# Patient Record
Sex: Female | Born: 1965 | Race: Black or African American | Hispanic: No | Smoking: Never smoker
Health system: Southern US, Community
[De-identification: ages and names within clinical notes are randomized; demographics above are authoritative.]

## PROBLEM LIST (undated history)

## (undated) DIAGNOSIS — T4145XA Adverse effect of unspecified anesthetic, initial encounter: Secondary | ICD-10-CM

## (undated) DIAGNOSIS — M1990 Unspecified osteoarthritis, unspecified site: Secondary | ICD-10-CM

## (undated) DIAGNOSIS — R112 Nausea with vomiting, unspecified: Secondary | ICD-10-CM

## (undated) DIAGNOSIS — T8859XA Other complications of anesthesia, initial encounter: Secondary | ICD-10-CM

## (undated) DIAGNOSIS — T7840XA Allergy, unspecified, initial encounter: Secondary | ICD-10-CM

## (undated) DIAGNOSIS — Z9889 Other specified postprocedural states: Secondary | ICD-10-CM

## (undated) DIAGNOSIS — Z789 Other specified health status: Secondary | ICD-10-CM

## (undated) HISTORY — DX: Allergy, unspecified, initial encounter: T78.40XA

## (undated) HISTORY — PX: LUNG CANCER SURGERY: SHX702

## (undated) HISTORY — DX: Unspecified osteoarthritis, unspecified site: M19.90

## (undated) HISTORY — PX: ABDOMINAL HYSTERECTOMY: SHX81

## (undated) HISTORY — PX: CHOLECYSTECTOMY: SHX55

## (undated) HISTORY — PX: ENDOMETRIAL ABLATION: SHX621

## (undated) HISTORY — PX: TONSILLECTOMY: SUR1361

## (undated) HISTORY — PX: KNEE ARTHROSCOPY: SUR90

---

## 1997-04-14 ENCOUNTER — Inpatient Hospital Stay (HOSPITAL_COMMUNITY): Admission: AD | Admit: 1997-04-14 | Discharge: 1997-04-16 | Payer: Self-pay | Admitting: Obstetrics & Gynecology

## 1997-06-28 ENCOUNTER — Other Ambulatory Visit: Admission: RE | Admit: 1997-06-28 | Discharge: 1997-06-28 | Payer: Self-pay | Admitting: Obstetrics and Gynecology

## 1997-11-29 ENCOUNTER — Encounter: Payer: Self-pay | Admitting: Obstetrics & Gynecology

## 1997-11-29 ENCOUNTER — Ambulatory Visit (HOSPITAL_COMMUNITY): Admission: RE | Admit: 1997-11-29 | Discharge: 1997-11-29 | Payer: Self-pay | Admitting: Obstetrics & Gynecology

## 1998-12-11 ENCOUNTER — Other Ambulatory Visit: Admission: RE | Admit: 1998-12-11 | Discharge: 1998-12-11 | Payer: Self-pay | Admitting: Obstetrics & Gynecology

## 2000-04-02 ENCOUNTER — Other Ambulatory Visit: Admission: RE | Admit: 2000-04-02 | Discharge: 2000-04-02 | Payer: Self-pay | Admitting: Obstetrics & Gynecology

## 2000-05-13 ENCOUNTER — Other Ambulatory Visit: Admission: RE | Admit: 2000-05-13 | Discharge: 2000-05-13 | Payer: Self-pay | Admitting: Obstetrics & Gynecology

## 2000-05-13 ENCOUNTER — Encounter (INDEPENDENT_AMBULATORY_CARE_PROVIDER_SITE_OTHER): Payer: Self-pay

## 2001-06-15 ENCOUNTER — Other Ambulatory Visit: Admission: RE | Admit: 2001-06-15 | Discharge: 2001-06-15 | Payer: Self-pay | Admitting: Obstetrics & Gynecology

## 2002-08-25 ENCOUNTER — Other Ambulatory Visit: Admission: RE | Admit: 2002-08-25 | Discharge: 2002-08-25 | Payer: Self-pay | Admitting: Obstetrics & Gynecology

## 2002-10-17 ENCOUNTER — Ambulatory Visit (HOSPITAL_COMMUNITY): Admission: RE | Admit: 2002-10-17 | Discharge: 2002-10-17 | Payer: Self-pay | Admitting: Internal Medicine

## 2002-10-17 ENCOUNTER — Encounter: Payer: Self-pay | Admitting: Internal Medicine

## 2002-11-21 ENCOUNTER — Encounter (HOSPITAL_BASED_OUTPATIENT_CLINIC_OR_DEPARTMENT_OTHER): Payer: Self-pay | Admitting: General Surgery

## 2002-11-22 ENCOUNTER — Encounter (HOSPITAL_BASED_OUTPATIENT_CLINIC_OR_DEPARTMENT_OTHER): Payer: Self-pay | Admitting: General Surgery

## 2002-11-22 ENCOUNTER — Ambulatory Visit (HOSPITAL_COMMUNITY): Admission: RE | Admit: 2002-11-22 | Discharge: 2002-11-23 | Payer: Self-pay | Admitting: General Surgery

## 2002-11-22 ENCOUNTER — Encounter (INDEPENDENT_AMBULATORY_CARE_PROVIDER_SITE_OTHER): Payer: Self-pay | Admitting: *Deleted

## 2003-09-14 ENCOUNTER — Other Ambulatory Visit: Admission: RE | Admit: 2003-09-14 | Discharge: 2003-09-14 | Payer: Self-pay | Admitting: Obstetrics & Gynecology

## 2003-11-25 ENCOUNTER — Emergency Department (HOSPITAL_COMMUNITY): Admission: EM | Admit: 2003-11-25 | Discharge: 2003-11-25 | Payer: Self-pay | Admitting: Family Medicine

## 2004-02-16 ENCOUNTER — Encounter (INDEPENDENT_AMBULATORY_CARE_PROVIDER_SITE_OTHER): Payer: Self-pay | Admitting: *Deleted

## 2004-02-16 ENCOUNTER — Ambulatory Visit (HOSPITAL_COMMUNITY): Admission: RE | Admit: 2004-02-16 | Discharge: 2004-02-16 | Payer: Self-pay | Admitting: Obstetrics & Gynecology

## 2004-04-24 ENCOUNTER — Ambulatory Visit: Payer: Self-pay | Admitting: Cardiology

## 2004-04-24 ENCOUNTER — Encounter: Payer: Self-pay | Admitting: Cardiology

## 2004-04-24 ENCOUNTER — Ambulatory Visit (HOSPITAL_COMMUNITY): Admission: RE | Admit: 2004-04-24 | Discharge: 2004-04-24 | Payer: Self-pay | Admitting: Internal Medicine

## 2004-06-26 ENCOUNTER — Emergency Department (HOSPITAL_COMMUNITY): Admission: EM | Admit: 2004-06-26 | Discharge: 2004-06-26 | Payer: Self-pay | Admitting: Emergency Medicine

## 2004-08-21 ENCOUNTER — Ambulatory Visit (HOSPITAL_COMMUNITY): Admission: RE | Admit: 2004-08-21 | Discharge: 2004-08-21 | Payer: Self-pay | Admitting: General Surgery

## 2004-08-21 ENCOUNTER — Encounter (INDEPENDENT_AMBULATORY_CARE_PROVIDER_SITE_OTHER): Payer: Self-pay | Admitting: *Deleted

## 2005-04-23 ENCOUNTER — Emergency Department (HOSPITAL_COMMUNITY): Admission: EM | Admit: 2005-04-23 | Discharge: 2005-04-23 | Payer: Self-pay | Admitting: Family Medicine

## 2006-05-13 ENCOUNTER — Emergency Department (HOSPITAL_COMMUNITY): Admission: EM | Admit: 2006-05-13 | Discharge: 2006-05-13 | Payer: Self-pay | Admitting: Family Medicine

## 2006-06-24 ENCOUNTER — Emergency Department (HOSPITAL_COMMUNITY): Admission: EM | Admit: 2006-06-24 | Discharge: 2006-06-24 | Payer: Self-pay | Admitting: Emergency Medicine

## 2007-04-27 ENCOUNTER — Emergency Department (HOSPITAL_COMMUNITY): Admission: EM | Admit: 2007-04-27 | Discharge: 2007-04-27 | Payer: Self-pay | Admitting: Family Medicine

## 2007-12-27 ENCOUNTER — Encounter (INDEPENDENT_AMBULATORY_CARE_PROVIDER_SITE_OTHER): Payer: Self-pay | Admitting: Obstetrics and Gynecology

## 2007-12-27 ENCOUNTER — Ambulatory Visit (HOSPITAL_COMMUNITY): Admission: RE | Admit: 2007-12-27 | Discharge: 2007-12-27 | Payer: Self-pay | Admitting: Obstetrics and Gynecology

## 2008-04-06 ENCOUNTER — Emergency Department (HOSPITAL_COMMUNITY): Admission: EM | Admit: 2008-04-06 | Discharge: 2008-04-06 | Payer: Self-pay | Admitting: Emergency Medicine

## 2008-09-09 ENCOUNTER — Emergency Department (HOSPITAL_COMMUNITY): Admission: EM | Admit: 2008-09-09 | Discharge: 2008-09-09 | Payer: Self-pay | Admitting: Emergency Medicine

## 2009-07-24 ENCOUNTER — Emergency Department (HOSPITAL_COMMUNITY): Admission: EM | Admit: 2009-07-24 | Discharge: 2009-07-24 | Payer: Self-pay | Admitting: Family Medicine

## 2009-08-14 ENCOUNTER — Emergency Department (HOSPITAL_COMMUNITY): Admission: EM | Admit: 2009-08-14 | Discharge: 2009-08-14 | Payer: Self-pay | Admitting: Family Medicine

## 2010-02-06 ENCOUNTER — Encounter
Admission: RE | Admit: 2010-02-06 | Discharge: 2010-02-06 | Payer: Self-pay | Source: Home / Self Care | Attending: Orthopedic Surgery | Admitting: Orthopedic Surgery

## 2010-02-14 ENCOUNTER — Ambulatory Visit (HOSPITAL_COMMUNITY)
Admission: RE | Admit: 2010-02-14 | Discharge: 2010-02-14 | Payer: Self-pay | Source: Home / Self Care | Attending: Orthopedic Surgery | Admitting: Orthopedic Surgery

## 2010-03-21 ENCOUNTER — Encounter
Admission: RE | Admit: 2010-03-21 | Discharge: 2010-03-26 | Payer: Self-pay | Source: Home / Self Care | Attending: Orthopedic Surgery | Admitting: Orthopedic Surgery

## 2010-03-27 ENCOUNTER — Ambulatory Visit: Payer: BC Managed Care – PPO | Admitting: Physical Therapy

## 2010-03-28 ENCOUNTER — Ambulatory Visit: Payer: BC Managed Care – PPO

## 2010-03-28 ENCOUNTER — Ambulatory Visit: Payer: BC Managed Care – PPO | Attending: Orthopedic Surgery

## 2010-03-28 DIAGNOSIS — M25669 Stiffness of unspecified knee, not elsewhere classified: Secondary | ICD-10-CM | POA: Insufficient documentation

## 2010-03-28 DIAGNOSIS — IMO0001 Reserved for inherently not codable concepts without codable children: Secondary | ICD-10-CM | POA: Insufficient documentation

## 2010-04-02 ENCOUNTER — Ambulatory Visit: Payer: BC Managed Care – PPO

## 2010-04-04 ENCOUNTER — Ambulatory Visit: Payer: BC Managed Care – PPO

## 2010-04-08 ENCOUNTER — Ambulatory Visit: Payer: BC Managed Care – PPO | Admitting: Physical Therapy

## 2010-04-15 ENCOUNTER — Other Ambulatory Visit: Payer: Self-pay | Admitting: Obstetrics & Gynecology

## 2010-05-06 LAB — COMPREHENSIVE METABOLIC PANEL
Alkaline Phosphatase: 60 U/L (ref 39–117)
BUN: 14 mg/dL (ref 6–23)
Chloride: 105 mEq/L (ref 96–112)
Creatinine, Ser: 0.78 mg/dL (ref 0.4–1.2)
GFR calc non Af Amer: >60 mL/min (ref >60)
Glucose, Bld: 101 mg/dL — ABNORMAL HIGH (ref 70–99)
Potassium: 4 mEq/L (ref 3.5–5.1)
Total Bilirubin: 0.5 mg/dL (ref 0.3–1.2)

## 2010-05-06 LAB — URINALYSIS, ROUTINE W REFLEX MICROSCOPIC
Hgb urine dipstick: NEGATIVE
Nitrite: NEGATIVE
Protein, ur: NEGATIVE mg/dL
Specific Gravity, Urine: 1.028 (ref 1.005–1.030)
Urobilinogen, UA: 1 mg/dL (ref 0.0–1.0)

## 2010-05-06 LAB — CBC
HCT: 35.1 % — ABNORMAL LOW (ref 36.0–46.0)
MCH: 26.9 pg (ref 26.0–34.0)
MCV: 82.8 fL (ref 78.0–100.0)
Platelets: 244 10*3/uL (ref 150–400)
RBC: 4.24 MIL/uL (ref 3.87–5.11)
WBC: 6.2 10*3/uL (ref 4.0–10.5)

## 2010-05-06 LAB — SURGICAL PCR SCREEN
MRSA, PCR: NEGATIVE
Staphylococcus aureus: NEGATIVE

## 2010-05-12 LAB — POCT URINALYSIS DIP (DEVICE)
Ketones, ur: NEGATIVE mg/dL
Protein, ur: NEGATIVE mg/dL
Urobilinogen, UA: 1 mg/dL (ref 0.0–1.0)
pH: 6 (ref 5.0–8.0)

## 2010-05-13 LAB — POCT RAPID STREP A (OFFICE): Streptococcus, Group A Screen (Direct): NEGATIVE

## 2010-07-09 NOTE — Op Note (Signed)
NAME:  [name], [name] NO.:  [id number]   MEDICAL RECORD NO.:  [id number]          PATIENT TYPE:  AMB   LOCATION:  SDC                           FACILITY:  [facility]   PHYSICIAN:  Malva Limes, M.D.    DATE OF BIRTH:  1965-05-26   DATE OF PROCEDURE:  12/27/2007  DATE OF DISCHARGE:                               OPERATIVE REPORT   PREOPERATIVE DIAGNOSIS:  Menorrhagia.   POSTOPERATIVE DIAGNOSIS:  Menorrhagia.   PROCEDURE:  1. Dilation and curettage.  2. Endometrial ablation with NovaSure device.   SURGEON:  Malva Limes, MD   ANESTHESIA:  MAC with paracervical block.   DRAINS:  None.   ANTIBIOTICS:  Ancef 1 g.   SPECIMENS:  Endometrial curettings sent to pathology.   COMPLICATIONS:  None.   ESTIMATED BLOOD LOSS:  Minimal.   PROCEDURE:  The patient was taken to the operating room, where she was  placed in a dorsal supine position.  MAC anesthesia was then  administered.  She was then placed in dorsal lithotomy position.  She  was prepped with Hibiclens and draped in the usual fashion for this  procedure.  An exam under anesthesia revealed an anteverted uterus of  normal size and shape.  A sterile speculum was placed in the vagina.  A  13 mL of 1% lidocaine was used for paracervical block.  The cervix was  then serially dilated to 29-French.  The uterus was sounded to 8.5 cm.  The cervical length was then measured at 3 cm giving a cavitary length  of 5.5 cm.  A sharp curettage was then performed.  Tissue was sent to  pathology.  Device was then placed in the uterine cavity and opened.  The width of the uterine cavity was 3.5 cm.  At this point, the device  was turned on for a 1 minute and 3 seconds, giving 109 watts.  The  device was then removed.  The patient tolerated the procedure well.  She  was taken to recovery room in stable condition.  Instrument and lap  counts were correct x1.  The patient will be discharged to home.  She  will be sent home with  Percocet to take p.r.n.  She will follow up in  the office in 4 weeks.          ______________________________  Malva Limes, M.D.    MA/MEDQ  D:  12/27/2007  T:  12/28/2007  Job:  [id number]

## 2010-07-12 NOTE — Op Note (Signed)
NAME:  TEMIKA, SUTPHIN NO.:  [id number]   MEDICAL RECORD NO.:  [id number]          PATIENT TYPE:  AMB   LOCATION:  DAY                          FACILITY:  [facility]   PHYSICIAN:  Leonie Man, M.D.   DATE OF BIRTH:  1965-10-19   DATE OF PROCEDURE:  08/21/2004  DATE OF DISCHARGE:                                 OPERATIVE REPORT   PREOPERATIVE DIAGNOSIS:  Grade 3 hemorrhoidal disease with anal tags.   POSTOPERATIVE DIAGNOSIS:  Grade 3 hemorrhoidal disease with anal tags.   PROCEDURE:  PPH stapled hemorrhoidectomy.   SURGEON:  Leonie Man, M.D.   ASSISTANT:  OR tech.   ANESTHESIA:  General.   SPECIMENS TO THE PATHOLOGIST:  Hemorrhoids.   ESTIMATED BLOOD LOSS:  Minimal.   COMPLICATIONS:  There no apparent complication. The patient returned to the  PACU in excellent condition.   Note, the patient is a 45 year old woman with recurrent hemorrhoidal  prolapse and external hemorrhoids who wishes to proceed with  hemorrhoidectomy. She has been having no constipation but mild recurrent  episodes of bleeding that had not been relieved by conservative methods. She  comes to the operating room today after the risks and potential benefits of  surgery had been discussed, all questions answered, consent obtained.   DESCRIPTION OF PROCEDURE:  Following the induction of satisfactory general  anesthesia, the patient is turned to the prone jackknife position and the  buttock cheeks are spread apart and held with tapes. The perianal tissues,  anus and vagina are prepped and draped to be included in the sterile  operative field. I injected 0.25% Marcaine with epinephrine around the  perianal tissues then dilated the anus up to approximately three  fingerbreadth's. I then inserted the operating scope and approximately 4 cm  above the dentate line, I placed 2-0 Prolene pursestring around the entire  anal verge. At this point, I tested the pursestring to check for its  integrity. I also checked the vagina to make sure that the rectovaginal  septum was not included within the pursestring and it was noted to be clear.  I then inserted the PPH stapling device and tied the pursestring suture down  around the anvil of the PPH device. I then close PPH device pulling the  hemorrhoidal tissue up into the device. An interval of one minute was  allowed so as to assure hemostasis and removal of all edema from the staple  line. The staple line was then fired and the stapler removed. A few  additional bleeding points along the staple line were treated with  electrocautery. The removed  hemorrhoid were inspected and there was a full and complete uninterrupted  ring. Sponge and instrument counts were then verified and a sterile dressing  placed over the anus. The anesthetic reversed and the patient removed from  the operating room to the recovery room in stable condition.       PB/MEDQ  D:  08/21/2004  T:  08/21/2004  Job:  [id number]

## 2010-07-12 NOTE — Op Note (Signed)
NAME:  [name], [name]                        ACCOUNT NO.:  [id number]   MEDICAL RECORD NO.:  [id number]                   PATIENT TYPE:  OIB   LOCATION:  2899                                 FACILITY:  [facility]   PHYSICIAN:  Leonie Man, M.D.                DATE OF BIRTH:  06-Oct-1965   DATE OF PROCEDURE:  11/22/2002  DATE OF DISCHARGE:                                 OPERATIVE REPORT   PREOPERATIVE DIAGNOSIS:  Chronic calculous cholecystitis.   POSTOPERATIVE DIAGNOSIS:  Chronic calculous cholecystitis.   OPERATION PERFORMED:  Laparoscopic cholecystectomy with intraoperative  cholangiogram.   SURGEON:  Leonie Man, M.D.   ASSISTANT:  Joanne Gavel, M.D.   ANESTHESIA:  General.   INDICATIONS FOR PROCEDURE:  The patient is a 45 year old woman who presents  with upper abdominal symptoms of pain, nausea and vomiting usually following  meals.  This has been going on since the early part of August this year.  She has had no chills, fever or jaundice.  Upper GI series is normal.  Abdominal ultrasound shows cholelithiasis.  She comes to the operating room  now after the risks and potential benefits of surgery have been fully  discussed, all questions answered and consent obtained.   DESCRIPTION OF PROCEDURE:  Following induction of satisfactory general  anesthesia, the patient positioned supinely, the abdomen was routinely  prepped and draped to be included in a sterile operative field.  Open  laparoscopy created at the umbilicus with insertion of a Hasson cannula and  insufflation of the peritoneal cavity to pressure.  Camera was  inserted and visual exploration of the abdomen carried out.  The anterior  gastric wall and duodenal sweep appeared to be normal.  The gallbladder was  chronically scarred and somewhat hydropic.  The liver edges were sharp,  liver surfaces smooth.  None of the small or large intestine viewed appeared  to be abnormal.  Under direct vision,  epigastric and lateral, ports were  placed, the gallbladder was grasped and retracted cephalad and dissection  carried down near the ampulla of the gallbladder with isolation of the  cystic artery and cystic duct.  The cystic artery traced to its entry into  the gallbladder wall.  It was doubly clipped and transected.  The cystic  duct was placed up to the gallbladder cystic duct junction and down to the  common duct junction.  This was clipped proximally and opened. A cystic duct  cholangiogram was then carried out with insertion of a Reddick catheter into  the peritoneal cavity and insertion into the cystic duct.  One half strength  Hypaque dye was used to inject into the intrahepatic biliary system under  fluoroscopic guidance.  The resulting cholangiogram showed prompt flow of  the contrast into the duodenum.  No filling defects into the extrahepatic or  intrahepatic ducts.  The duct size was noted to be normal.  Cholangiocatheter was removed.  The cystic duct was doubly clipped and  transected and the gallbladder dissected free from the liver bed using  electrocautery and maintaining hemostasis through the course of the  dissection.  At the end of dissection, the liver bed was checked for  hemostasis and noted to be dry.  The gallbladder was then grasped and  retrieved through the umbilical wound with a camera placed in the epigastric  wound without difficulty.  The right upper quadrant was thoroughly irrigated  and aspirated free.  Trocars were removed under direct vision.  The  pneumoperitoneum was allowed to deflate.  Sponge, instrument and sharp  counts were then verified.  The wound was closed in layers with the  umbilical wound in two layers with 0 Vicryl and 4-0  Monocryl.  Epigastric and flank wound was closed with 4-0 Monocryl.  All  wounds were reinforced with Steri-Strips.  Sterile dressings applied.  Anesthetic was reversed and the patient removed from the operating room  to  the recovery room in stable condition.  She tolerated the procedure well.                                                Leonie Man, M.D.    PB/MEDQ  D:  11/22/2002  T:  11/22/2002  Job:  [id number]

## 2010-07-12 NOTE — Op Note (Signed)
NAME:  [name], [name] NO.:  [id number]   MEDICAL RECORD NO.:  [id number]          PATIENT TYPE:  AMB   LOCATION:  SDC                           FACILITY:  [facility]   PHYSICIAN:  Gerrit Friends. Aldona Bar, M.D.   DATE OF BIRTH:  1965-10-10   DATE OF PROCEDURE:  02/16/2004  DATE OF DISCHARGE:                                 OPERATIVE REPORT   PREOPERATIVE DIAGNOSES:  Menorrhagia and history of anemia.   POSTOPERATIVE DIAGNOSES:  Menorrhagia and history of anemia, pathology  pending.   PROCEDURES:  1.  Examination under anesthesia.  2.  Dilatation and curettage.   ANESTHESIA:  IV sedation and paracervical block with 1% Xylocaine without  epinephrine.   SURGEON:  Gerrit Friends. Aldona Bar, M.D.   PROCEDURE:  This patient, a 45 year old female, gravida 4, para 2, abortus  2, had her last menses in early December and with each period relates five  days of heavy bleeding with clots.  Her cycles are relatively regular.  Her  annual exam in July was essentially normal, Pap smear and mammogram both  normal.  She has been anemic in the past.  In July her hemoglobin was 11.9.  Her thyroid function tests in July were also normal.  She is taken to the  operating room now for examination under anesthesia and dilatation and  curettage with a diagnosis of menorrhagia.   PROCEDURE:  The patient was taken to the operating room, where after the  satisfactory induction of intravenous sedation she was prepped and draped  having been placed in the short Allen stirrups in the modified lithotomy  position.  She was prepped and draped in the usual fashion.  Bladder was  drained of clear urine with a red rubber catheter in an in-and-out fashion.  The speculum was placed, single-tooth tenaculum placed on the anterior lip  of the cervix, and a paracervical block carried out with approximately 18 mL  of 1% Xylocaine with epinephrine.   At this time after the anesthetic was effective, the internal os was  dilated  to a #27 Pratt dilator.  The cavity sounded to 9 cm.  Using the small ring  forceps, the cavity was probed and no polypoid-like tissue was produced.  Thereafter using a small serrated curette, a thorough gentle and systematic  curettage was carried out with production of a moderate amount of tissue,  all of which was sent to pathology appropriately labeled, endometrial  curettings.  Again the cavity was probed, no production of further tissue,  and again the cavity was curetted with a minimum amount of tissue produced.  The procedure at this time was felt to be complete.  All instruments were  removed.  Blood loss noted to be approximately 25 mL.  As mentioned, total  specimen was sent labeled, endometrial curettings.  Examination was now  carried out with findings consistent with a uterus that was normal size,  very mobile.  Adnexal areas were negative.  The uterus did not seem to pull  down that well but did pull down some, probably to within 1.5 cm of the  introitus.   The patient was taken to the recovery room in satisfactory condition, having  tolerated the procedure well.  She will be discharged to home and may use  Advil or Aleve as needed for cramping and will return to the office in  approximately three to four weeks.  Condition on arrival to recovery room  satisfactory.      RMW/MEDQ  D:  02/16/2004  T:  02/16/2004  Job:  [id number]

## 2010-11-26 LAB — CBC
HCT: 38.4
Hemoglobin: 12.3
WBC: 5.6

## 2011-02-05 ENCOUNTER — Encounter (HOSPITAL_COMMUNITY): Payer: Self-pay

## 2011-02-06 ENCOUNTER — Encounter (HOSPITAL_COMMUNITY): Payer: Self-pay

## 2011-02-06 ENCOUNTER — Encounter (HOSPITAL_COMMUNITY)
Admission: RE | Admit: 2011-02-06 | Discharge: 2011-02-06 | Disposition: A | Discharge status: Home / Self Care | Payer: BC Managed Care – PPO | Source: Ambulatory Visit | Attending: Obstetrics and Gynecology | Admitting: Obstetrics and Gynecology

## 2011-02-06 HISTORY — DX: Adverse effect of unspecified anesthetic, initial encounter: T41.45XA

## 2011-02-06 HISTORY — DX: Other specified postprocedural states: R11.2

## 2011-02-06 HISTORY — DX: Other specified health status: Z78.9

## 2011-02-06 HISTORY — DX: Other complications of anesthesia, initial encounter: T88.59XA

## 2011-02-06 HISTORY — DX: Other specified postprocedural states: Z98.890

## 2011-02-06 LAB — CBC
HCT: 40.1 % (ref 36.0–46.0)
MCV: 82.5 fL (ref 78.0–100.0)
Platelets: 288 10*3/uL (ref 150–400)
RBC: 4.86 MIL/uL (ref 3.87–5.11)
WBC: 7.3 10*3/uL (ref 4.0–10.5)

## 2011-02-06 NOTE — Patient Instructions (Signed)
YOUR PROCEDURE IS SCHEDULED ON:02/13/11  ENTER THROUGH THE MAIN ENTRANCE OF [facility] AT:1030 am  USE DESK PHONE AND DIAL [phone] TO INFORM us OF YOUR ARRIVAL  CALL [phone] IF YOU HAVE ANY QUESTIONS OR PROBLEMS PRIOR TO YOUR ARRIVAL.  REMEMBER: DO NOT EAT AFTER MIDNIGHT :Wed  SPECIAL INSTRUCTIONS:water ok until 8am Thursday   YOU MAY BRUSH YOUR TEETH THE MORNING OF SURGERY   TAKE THESE MEDICINES THE DAY OF SURGERY WITH SIP OF WATER:none   DO NOT WEAR JEWELRY, EYE MAKEUP, LIPSTICK OR DARK FINGERNAIL POLISH DO NOT WEAR LOTIONS  DO NOT SHAVE FOR 48 HOURS PRIOR TO SURGERY  YOU WILL NOT BE ALLOWED TO DRIVE YOURSELF HOME.  NAME OF DRIVER: NEPHEW- Sharia Reeve [id number]

## 2011-02-12 NOTE — H&P (Signed)
NAME:  [name], [name] NO.:  [id number]  MEDICAL RECORD NO.:  [id number]  LOCATION:  PERIO                         FACILITY:  [facility]  PHYSICIAN:  Malva Limes, M.D.    DATE OF BIRTH:  Apr 29, 1965  DATE OF ADMISSION:  02/05/2011 DATE OF DISCHARGE:                             HISTORY & PHYSICAL   HISTORY OF PRESENT ILLNESS:  [name] is a 45 year old, black female, G4, P2-0-2-2, who presents to [facility] for a laparoscopic- assisted vaginal hysterectomy with bilateral salpingo-oophorectomy, secondary to chronic pelvic pain.  The patient began to have pelvic pain approximately 1 year ago, at which time, she saw Dr. Deniece Portela.  This pain was in the left lower quadrant, intermittent.  He felt it was possibly endometriosis and treated the patient with 6 months of Lupron.  While the patient was on Lupron, she had no pain.  However, after discontinuing the use of Lupron, the pain returned approximately 2 months later.  The patient also has known small uterine fibroids.  The patient did have a NovaSure procedure done in 2009, secondary to menometrorrhagia.  Since that time, she has had no uterine bleeding. Ultrasound also did not reveal hematometra.  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 She underwent cystometric which results indicated the patient most likely had urge incontinence.  The patient was put on Detrol and since that time has had no loss of urine, nocturia or urgency.  Because of that, she will not undergo a TVT sling.  Prior to this procedure performed, the patient expressed her understanding of the possible risks and complications of surgery.  She also notes that she will go into menopause because of loss of her ovaries.  PAST MEDICAL HISTORY:  The patient has had 2 vaginal births, a postpartum tubal ligation, tonsillectomy, surgery on her knee and the NovaSure D and C as mentioned above.  ALLERGIES:   The patient is allergic to penicillin, Augmentin, the tetanus vaccine, shell fish.  CURRENT MEDICATIONS:  Hydrocodone p.r.n.  PHYSICAL EXAMINATION:  GENERAL:  The patient is a well-developed, well- nourished, black female, in no apparent distress.  HEENT:  Within normal limits. LUNGS:  Clear to auscultation. CARDIOVASCULAR:  Regular rate and rhythm without murmur. BREASTS:  Without masses or tenderness.  There is no lymphadenopathy. ABDOMEN:  Soft, nontender and nondistended.  There is no rebound or guarding.  There is no organomegaly. EXTREMITIES:  Within normal limits. PELVIC:  Normal external genitalia.  The vagina without lesions or discharge.  The cervix is parous.  The uterus feels approximately 8 weeks in size.  There is no adnexal masses.  There is some cervical motion tenderness.  IMPRESSION: 1. Chronic pelvic pain. 2. Likely endometriosis.  PLAN:  Proceed with laparoscopic-assisted vaginal hysterectomy with bilateral salpingo-oophorectomy.          ______________________________ Malva Limes, M.D.     MA/MEDQ  D:  02/11/2011  T:  02/12/2011  Job:  [id number]

## 2011-02-13 ENCOUNTER — Ambulatory Visit (HOSPITAL_COMMUNITY): Payer: BC Managed Care – PPO | Admitting: Anesthesiology

## 2011-02-13 ENCOUNTER — Other Ambulatory Visit: Payer: Self-pay | Admitting: Obstetrics and Gynecology

## 2011-02-13 ENCOUNTER — Encounter (HOSPITAL_COMMUNITY): Payer: Self-pay | Admitting: Anesthesiology

## 2011-02-13 ENCOUNTER — Encounter (HOSPITAL_COMMUNITY)
Admission: RE | Disposition: A | Discharge status: Home / Self Care | Payer: Self-pay | Source: Ambulatory Visit | Attending: Obstetrics and Gynecology

## 2011-02-13 ENCOUNTER — Inpatient Hospital Stay (HOSPITAL_COMMUNITY)
Admission: RE | Admit: 2011-02-13 | Discharge: 2011-02-14 | DRG: 361 | Disposition: A | Discharge status: Home / Self Care | Payer: BC Managed Care – PPO | Source: Ambulatory Visit | Attending: Obstetrics and Gynecology | Admitting: Obstetrics and Gynecology

## 2011-02-13 DIAGNOSIS — D251 Intramural leiomyoma of uterus: Secondary | ICD-10-CM | POA: Diagnosis present

## 2011-02-13 DIAGNOSIS — N838 Other noninflammatory disorders of ovary, fallopian tube and broad ligament: Secondary | ICD-10-CM | POA: Diagnosis present

## 2011-02-13 DIAGNOSIS — N949 Unspecified condition associated with female genital organs and menstrual cycle: Principal | ICD-10-CM | POA: Diagnosis present

## 2011-02-13 DIAGNOSIS — Z3480 Encounter for supervision of other normal pregnancy, unspecified trimester: Secondary | ICD-10-CM

## 2011-02-13 DIAGNOSIS — D252 Subserosal leiomyoma of uterus: Secondary | ICD-10-CM | POA: Diagnosis present

## 2011-02-13 DIAGNOSIS — N8000 Endometriosis of the uterus, unspecified: Secondary | ICD-10-CM | POA: Diagnosis present

## 2011-02-13 HISTORY — PX: LAPAROSCOPIC ASSISTED VAGINAL HYSTERECTOMY: SHX5398

## 2011-02-13 HISTORY — PX: SALPINGOOPHORECTOMY: SHX82

## 2011-02-13 SURGERY — HYSTERECTOMY, VAGINAL, LAPAROSCOPY-ASSISTED
Anesthesia: General | Site: Uterus | Wound class: Clean Contaminated

## 2011-02-13 MED ORDER — NEOSTIGMINE METHYLSULFATE 1 MG/ML IJ SOLN
INTRAMUSCULAR | Status: AC
  Filled 2011-02-13: qty 10

## 2011-02-13 MED ORDER — TOLTERODINE TARTRATE ER 4 MG PO CP24
4.0000 mg | ORAL_CAPSULE | Freq: Every day | ORAL | Status: DC
  Administered 2011-02-14: 4 mg via ORAL
  Filled 2011-02-13 (×3): qty 1

## 2011-02-13 MED ORDER — LIDOCAINE-EPINEPHRINE 1 %-1:100000 IJ SOLN
INTRAMUSCULAR | Status: DC | PRN
  Administered 2011-02-13: 10 mL

## 2011-02-13 MED ORDER — FENTANYL CITRATE 0.05 MG/ML IJ SOLN
INTRAMUSCULAR | Status: DC | PRN
  Administered 2011-02-13: 50 ug via INTRAVENOUS
  Administered 2011-02-13: 100 ug via INTRAVENOUS
  Administered 2011-02-13 (×2): 50 ug via INTRAVENOUS
  Administered 2011-02-13: 100 ug via INTRAVENOUS

## 2011-02-13 MED ORDER — ONDANSETRON HCL 4 MG/2ML IJ SOLN
INTRAMUSCULAR | Status: AC
  Filled 2011-02-13: qty 2

## 2011-02-13 MED ORDER — MIDAZOLAM HCL 5 MG/5ML IJ SOLN
INTRAMUSCULAR | Status: DC | PRN
  Administered 2011-02-13: 2 mg via INTRAVENOUS

## 2011-02-13 MED ORDER — CEFAZOLIN SODIUM 1-5 GM-% IV SOLN
1.0000 g | INTRAVENOUS | Status: AC
  Administered 2011-02-13: 1 g via INTRAVENOUS

## 2011-02-13 MED ORDER — LIDOCAINE HCL (CARDIAC) 20 MG/ML IV SOLN
INTRAVENOUS | Status: AC
  Filled 2011-02-13: qty 5

## 2011-02-13 MED ORDER — FENTANYL CITRATE 0.05 MG/ML IJ SOLN
INTRAMUSCULAR | Status: AC
  Filled 2011-02-13: qty 5

## 2011-02-13 MED ORDER — CEFAZOLIN SODIUM 1-5 GM-% IV SOLN
INTRAVENOUS | Status: AC
  Filled 2011-02-13: qty 50

## 2011-02-13 MED ORDER — DEXAMETHASONE SODIUM PHOSPHATE 10 MG/ML IJ SOLN
INTRAMUSCULAR | Status: AC
  Filled 2011-02-13: qty 1

## 2011-02-13 MED ORDER — PHENYLEPHRINE HCL 10 MG/ML IJ SOLN
INTRAMUSCULAR | Status: DC | PRN
  Administered 2011-02-13 (×2): 80 ug via INTRAVENOUS
  Administered 2011-02-13: 40 ug via INTRAVENOUS

## 2011-02-13 MED ORDER — ROCURONIUM BROMIDE 100 MG/10ML IV SOLN
INTRAVENOUS | Status: DC | PRN
  Administered 2011-02-13: 5 mg via INTRAVENOUS
  Administered 2011-02-13: 35 mg via INTRAVENOUS
  Administered 2011-02-13: 10 mg via INTRAVENOUS

## 2011-02-13 MED ORDER — DOCUSATE SODIUM 100 MG PO CAPS
100.0000 mg | ORAL_CAPSULE | Freq: Two times a day (BID) | ORAL | Status: DC
  Administered 2011-02-13 – 2011-02-14 (×2): 100 mg via ORAL
  Filled 2011-02-13 (×2): qty 1

## 2011-02-13 MED ORDER — OXYCODONE-ACETAMINOPHEN 5-325 MG PO TABS
1.0000 | ORAL_TABLET | ORAL | Status: DC | PRN
  Administered 2011-02-13 – 2011-02-14 (×4): 2 via ORAL
  Filled 2011-02-13 (×4): qty 2

## 2011-02-13 MED ORDER — ROCURONIUM BROMIDE 50 MG/5ML IV SOLN
INTRAVENOUS | Status: AC
  Filled 2011-02-13: qty 1

## 2011-02-13 MED ORDER — KETOROLAC TROMETHAMINE 60 MG/2ML IM SOLN
INTRAMUSCULAR | Status: DC | PRN
  Administered 2011-02-13: 30 mg via INTRAMUSCULAR

## 2011-02-13 MED ORDER — KETOROLAC TROMETHAMINE 30 MG/ML IJ SOLN
INTRAMUSCULAR | Status: DC | PRN
  Administered 2011-02-13: 30 mg via INTRAVENOUS

## 2011-02-13 MED ORDER — LACTATED RINGERS IR SOLN
Status: DC | PRN
  Administered 2011-02-13: 3000 mL

## 2011-02-13 MED ORDER — ONDANSETRON HCL 4 MG/2ML IJ SOLN
INTRAMUSCULAR | Status: DC | PRN
  Administered 2011-02-13: 4 mg via INTRAVENOUS

## 2011-02-13 MED ORDER — DEXAMETHASONE SODIUM PHOSPHATE 10 MG/ML IJ SOLN
INTRAMUSCULAR | Status: DC | PRN
  Administered 2011-02-13: 10 mg via INTRAVENOUS

## 2011-02-13 MED ORDER — GLYCOPYRROLATE 0.2 MG/ML IJ SOLN
INTRAMUSCULAR | Status: AC
  Filled 2011-02-13: qty 2

## 2011-02-13 MED ORDER — NEOSTIGMINE METHYLSULFATE 1 MG/ML IJ SOLN
INTRAMUSCULAR | Status: DC | PRN
  Administered 2011-02-13: 3 mg via INTRAVENOUS

## 2011-02-13 MED ORDER — LACTATED RINGERS IV SOLN
INTRAVENOUS | Status: DC
  Administered 2011-02-13 (×4): via INTRAVENOUS

## 2011-02-13 MED ORDER — GLYCOPYRROLATE 0.2 MG/ML IJ SOLN
INTRAMUSCULAR | Status: DC | PRN
  Administered 2011-02-13: 0.2 mg via INTRAVENOUS
  Administered 2011-02-13: .6 mg via INTRAVENOUS

## 2011-02-13 MED ORDER — HYDROMORPHONE HCL PF 1 MG/ML IJ SOLN
INTRAMUSCULAR | Status: AC
  Administered 2011-02-13: 0.6 mg via INTRAVENOUS
  Filled 2011-02-13: qty 1

## 2011-02-13 MED ORDER — BUPIVACAINE HCL (PF) 0.25 % IJ SOLN
INTRAMUSCULAR | Status: DC | PRN
  Administered 2011-02-13: 10 mL

## 2011-02-13 MED ORDER — HYDROMORPHONE HCL PF 1 MG/ML IJ SOLN
0.2000 mg | INTRAMUSCULAR | Status: DC | PRN
  Administered 2011-02-13 (×2): 0.6 mg via INTRAVENOUS
  Filled 2011-02-13 (×2): qty 1

## 2011-02-13 MED ORDER — LIDOCAINE HCL (CARDIAC) 20 MG/ML IV SOLN
INTRAVENOUS | Status: DC | PRN
  Administered 2011-02-13: 60 mg via INTRAVENOUS

## 2011-02-13 MED ORDER — DEXTROSE-NACL 5-0.45 % IV SOLN
INTRAVENOUS | Status: DC
  Administered 2011-02-13 – 2011-02-14 (×2): via INTRAVENOUS

## 2011-02-13 MED ORDER — PROPOFOL 10 MG/ML IV EMUL
INTRAVENOUS | Status: DC | PRN
  Administered 2011-02-13: 150 mg via INTRAVENOUS

## 2011-02-13 MED ORDER — KETOROLAC TROMETHAMINE 30 MG/ML IJ SOLN: 30.0000 mg | Freq: Four times a day (QID) | INTRAMUSCULAR | Status: DC

## 2011-02-13 MED ORDER — MIDAZOLAM HCL 2 MG/2ML IJ SOLN
INTRAMUSCULAR | Status: AC
  Filled 2011-02-13: qty 2

## 2011-02-13 MED ORDER — PROPOFOL 10 MG/ML IV EMUL
INTRAVENOUS | Status: AC
  Filled 2011-02-13: qty 20

## 2011-02-13 MED ORDER — HYDROMORPHONE HCL PF 1 MG/ML IJ SOLN
0.2500 mg | INTRAMUSCULAR | Status: DC | PRN
  Administered 2011-02-13 (×2): 0.5 mg via INTRAVENOUS

## 2011-02-13 SURGICAL SUPPLY — 33 items
ADH SKN CLS APL DERMABOND .7 (GAUZE/BANDAGES/DRESSINGS) ×3
CATH ROBINSON RED A/P 16FR (CATHETERS) ×2 IMPLANT
CLOTH BEACON ORANGE TIMEOUT ST (SAFETY) ×4 IMPLANT
COVER TABLE BACK 60X90 (DRAPES) ×4 IMPLANT
DECANTER SPIKE VIAL GLASS SM (MISCELLANEOUS) ×2 IMPLANT
DERMABOND ADVANCED (GAUZE/BANDAGES/DRESSINGS) ×1
DERMABOND ADVANCED .7 DNX12 (GAUZE/BANDAGES/DRESSINGS) ×3 IMPLANT
ELECT REM PT RETURN 9FT ADLT (ELECTROSURGICAL) ×4
ELECTRODE REM PT RTRN 9FT ADLT (ELECTROSURGICAL) ×1 IMPLANT
EVACUATOR SMOKE 8.L (FILTER) IMPLANT
FORCEPS CUTTING 33CM 5MM (CUTTING FORCEPS) ×2 IMPLANT
GLOVE ECLIPSE 7.0 STRL STRAW (GLOVE) ×8 IMPLANT
GOWN PREVENTION PLUS LG XLONG (DISPOSABLE) ×12 IMPLANT
GOWN PREVENTION PLUS XLARGE (GOWN DISPOSABLE) ×4 IMPLANT
NS IRRIG 1000ML POUR BTL (IV SOLUTION) ×4 IMPLANT
PACK LAVH (CUSTOM PROCEDURE TRAY) ×4 IMPLANT
SCISSORS LAP 5X35 DISP (ENDOMECHANICALS) IMPLANT
SET IRRIG TUBING LAPAROSCOPIC (IRRIGATION / IRRIGATOR) ×2 IMPLANT
SOLUTION ELECTROLUBE (MISCELLANEOUS) ×4 IMPLANT
STRIP CLOSURE SKIN 1/4X3 (GAUZE/BANDAGES/DRESSINGS) IMPLANT
SUT MNCRL 0 MO-4 VIOLET 18 CR (SUTURE) ×9 IMPLANT
SUT MNCRL 0 VIOLET 6X18 (SUTURE) ×3 IMPLANT
SUT MONOCRYL 0 6X18 (SUTURE) ×1
SUT MONOCRYL 0 MO 4 18  CR/8 (SUTURE) ×3
SUT VIC AB 2-0 CT1 27 (SUTURE) ×16
SUT VIC AB 2-0 CT1 TAPERPNT 27 (SUTURE) ×12 IMPLANT
SUT VICRYL 0 UR6 27IN ABS (SUTURE) ×8 IMPLANT
SUT VICRYL RAPIDE 3 0 (SUTURE) ×4 IMPLANT
TOWEL OR 17X24 6PK STRL BLUE (TOWEL DISPOSABLE) ×8 IMPLANT
TROCAR BALLN 12MMX100 BLUNT (TROCAR) ×4 IMPLANT
TROCAR Z-THREAD BLADED 5X100MM (TROCAR) ×8 IMPLANT
WARMER LAPAROSCOPE (MISCELLANEOUS) ×4 IMPLANT
WATER STERILE IRR 1000ML POUR (IV SOLUTION) ×4 IMPLANT

## 2011-02-13 NOTE — Progress Notes (Signed)
Pt has been seen and examined by me today, H and P reviewed, No change in the plan of care.

## 2011-02-13 NOTE — Anesthesia Procedure Notes (Addendum)
Procedure Name: Intubation Date/Time: 02/13/2011 12:36 PM Performed by: Karleen Dolphin Pre-anesthesia Checklist: Patient identified, Patient being monitored, Emergency Drugs available, Timeout performed and Suction available Patient Re-evaluated:Patient Re-evaluated prior to inductionOxygen Delivery Method: Circle System Utilized Preoxygenation: Pre-oxygenation with 100% oxygen Intubation Type: IV induction Ventilation: Mask ventilation without difficulty Laryngoscope Size: Mac and 3 Grade View: Grade I Tube type: Oral Tube size: 7.0 mm Number of attempts: 1 Airway Equipment and Method: stylet Placement Confirmation: ETT inserted through vocal cords under direct vision,  positive ETCO2 and breath sounds checked- equal and bilateral Secured at: 21 cm Tube secured with: Tape Dental Injury: Teeth and Oropharynx as per pre-operative assessment

## 2011-02-13 NOTE — OR Nursing (Signed)
Dr. Dareen Piano notifies of allergy to penicillin, ordered cefazolin pre-op. Stated okay to give as ordered.

## 2011-02-13 NOTE — Anesthesia Postprocedure Evaluation (Signed)
  Anesthesia Post-op Note  Patient: [name]  Procedure(s) Performed:  LAPAROSCOPIC ASSISTED VAGINAL HYSTERECTOMY; SALPINGO OOPHERECTOMY Patient is awake and responsive. Pain and nausea are reasonably well controlled. Vital signs are stable and clinically acceptable. Oxygen saturation is clinically acceptable. There are no apparent anesthetic complications at this time. Patient is ready for discharge.

## 2011-02-13 NOTE — Preoperative (Signed)
Beta Blockers   Reason not to administer Beta Blockers:Not Applicable 

## 2011-02-13 NOTE — Anesthesia Preprocedure Evaluation (Signed)
Anesthesia Evaluation  Patient identified by MRN, date of birth, ID band Patient awake    Reviewed: Allergy & Precautions, H&P , NPO status , Patient's Chart, lab work & pertinent test results, reviewed documented beta blocker date and time   History of Anesthesia Complications (+) PONV  Airway Mallampati: II TM Distance: >3 FB Neck ROM: full    Dental  (+) Teeth Intact   Pulmonary neg pulmonary ROS,  clear to auscultation  Pulmonary exam normal       Cardiovascular Exercise Tolerance: Good neg cardio ROS regular Normal    Neuro/Psych Negative Neurological ROS  Negative Psych ROS   GI/Hepatic negative GI ROS, Neg liver ROS,   Endo/Other  Negative Endocrine ROS  Renal/GU      Musculoskeletal   Abdominal   Peds  Hematology negative hematology ROS (+)   Anesthesia Other Findings   Reproductive/Obstetrics negative OB ROS                           Anesthesia Physical Anesthesia Plan  ASA: I  Anesthesia Plan: General ETT   Post-op Pain Management:    Induction:   Airway Management Planned:   Additional Equipment:   Intra-op Plan:   Post-operative Plan:   Informed Consent: I have reviewed the patients History and Physical, chart, labs and discussed the procedure including the risks, benefits and alternatives for the proposed anesthesia with the patient or authorized representative who has indicated his/her understanding and acceptance.   Dental Advisory Given  Plan Discussed with: CRNA and Surgeon  Anesthesia Plan Comments:         Anesthesia Quick Evaluation

## 2011-02-13 NOTE — Transfer of Care (Signed)
Immediate Anesthesia Transfer of Care Note  Patient: [name]  Procedure(s) Performed:  LAPAROSCOPIC ASSISTED VAGINAL HYSTERECTOMY; SALPINGO OOPHERECTOMY  Patient Location: PACU  Anesthesia Type: General  Level of Consciousness: awake, alert , oriented and patient cooperative  Airway & Oxygen Therapy: Patient Spontanous Breathing and Patient connected to nasal cannula oxygen  Post-op Assessment: Report given to PACU RN and Post -op Vital signs reviewed and stable  Post vital signs: Reviewed and stable  Complications: No apparent anesthesia complications

## 2011-02-14 LAB — CBC
MCH: 26.4 pg (ref 26.0–34.0)
MCHC: 33 g/dL (ref 30.0–36.0)
Platelets: 250 10*3/uL (ref 150–400)
RBC: 3.9 MIL/uL (ref 3.87–5.11)

## 2011-02-14 MED ORDER — IBUPROFEN 600 MG PO TABS
600.0000 mg | ORAL_TABLET | Freq: Four times a day (QID) | ORAL | Status: DC | PRN
  Administered 2011-02-14: 600 mg via ORAL
  Filled 2011-02-14: qty 1

## 2011-02-14 NOTE — Addendum Note (Signed)
Addendum  created 02/14/11 0815 by Madison Hickman   Modules edited:Notes Section

## 2011-02-14 NOTE — Progress Notes (Signed)
UR Chart review completed.  

## 2011-02-14 NOTE — Progress Notes (Signed)
POD#1 Pt had some nausea last pm. Able to keep down applesauce. Scant blood from vagina. VSSAF Hgb-ok IMP/ POD#1 stable, Plan/ Pt to shower, ambulate.           May d/c home later today

## 2011-02-14 NOTE — Op Note (Signed)
NAMESASCHA, [name]  MEDICAL RECORD NO.:  [id number]  LOCATION:  9320                          FACILITY:  [facility]  PHYSICIAN:  Malva Limes, M.D.    DATE OF BIRTH:  04/23/1965  DATE OF PROCEDURE:  02/13/2011 DATE OF DISCHARGE:                              OPERATIVE REPORT   PREOPERATIVE DIAGNOSES: 1. Chronic pelvic pain. 2. Uterine leiomyomata. 3. Suspected endometriosis.  POSTOPERATIVE DIAGNOSES: 1. Chronic pelvic pain. 2. Uterine leiomyomata. 3. Suspected endometriosis, without evidence of endometriosis.  PROCEDURE:  Laparoscopic-assisted hysterectomy with bilateral salpingo- oophorectomy.  SURGEON:  Malva Limes, MD  ASSISTANT:  Leslie Redden, MD  ANESTHESIA:  General and local.  ANTIBIOTICS:  Ancef 1 g.  DRAINS:  Foley with bedside drainage.  ESTIMATED BLOOD LOSS:  150 mL.  COMPLICATIONS:  None.  SPECIMENS:  Cervix, uterus, fallopian tubes, and ovaries sent to Pathology.  PROCEDURE:  The patient was taken to the operating room where a general anesthetic was administered without difficulty.  She was then placed in dorsal lithotomy position.  She was prepped and draped in the usual fashion for this procedure.  A sterile speculum was placed in the vagina and Hulka tenaculum was applied to the anterior cervical lip.  The Foley catheter was placed in the bladder.  Next, attention was addressed to the umbilicus where 10 mL of 1% lidocaine was injected.  A vertical skin incision was made.  The fascia was grasped and entered with Mayo scissors.  The parietal peritoneum was entered bluntly.  A 0 Vicryl suture was placed in a purse-string fashion.  The 12-mm Hasson cannula was placed into the abdominal cavity.  The abdominal cavity was insufflated with 3 L of carbon dioxide.  The patient was placed in Trendelenburg.  The 5-mm ports were placed under direct visualization in the right and left lower quadrants.  At this point, liver  appeared to be normal.  Appendix was not visualized.  There was no evidence of any bowel adhesions, adhesions in the pelvis, endometriosis.  The patient had normal fallopian tubes and ovaries bilaterally.  There was evidence of past tubal ligation.  The patient did have rather large uterine fibroid on the fundus of the uterus.  It appeared to be approximately the same size of the uterus.  At this point, the ureter on the left was identified.  The infundibulopelvic ligament on the left was then cauterized and transected.  The round ligament was cauterized and transected and then the remaining broad ligament was cauterized and transected to the level of the uterine vessel.  A similar procedure was performed on the opposite side.  At this point, attention was then placed in the vagina and the hysterectomy was continued.  The cervix was injected with 15 mL of 1% lidocaine.  The posterior cul-de-sac was then entered sharply.  Uterosacral ligaments were bilaterally clamped, cut, and ligated with 0 Monocryl suture.  The cervix was then circumscribed. The bladder pillars were bilaterally clamped, cut, and ligated with 0 Monocryl suture.  The cardinal ligaments were serially clamped, cut, and ligated with 0 Monocryl suture.  The anterior cul-de-sac was entered sharply.  The uterine vessels were bilaterally  clamped, cut, and ligated with 0 Monocryl suture.  The uterus, fallopian tubes, and ovaries were then removed.  All pedicles were checked and felt to be hemostatic.  The posterior cuff was then closed using 2-0 Vicryl in a running locking fashion.  The remaining vaginal cuff was closed vertically using 2-0 Vicryl in a running fashion.  The abdominal cavity was then insufflated and the scope was used to examine all pedicles.  Copious irrigation was performed.  No evidence of any bleeding was seen.  This concluded the procedure and the instruments were removed.  The pneumoperitoneum was released.   The fascia was closed with 0 Monocryl suture and the skin with Dermabond.  The patient was awakened and taken to the recovery room in stable condition.  Instrument and lap counts were correct x3.          ______________________________ Malva Limes, M.D.     MA/MEDQ  D:  02/13/2011  T:  02/14/2011  Job:  [id number]

## 2011-02-14 NOTE — Anesthesia Postprocedure Evaluation (Signed)
  Anesthesia Post-op Note  Patient: [name]  Procedure(s) Performed:  LAPAROSCOPIC ASSISTED VAGINAL HYSTERECTOMY; SALPINGO OOPHERECTOMY  Patient Location: [facility]  Anesthesia Type: General  Level of Consciousness: awake, alert  and oriented  Airway and Oxygen Therapy: Patient Spontanous Breathing  Post-op Pain: none  Post-op Assessment: Post-op Vital signs reviewed and Patient's Cardiovascular Status Stable  Post-op Vital Signs: Reviewed and stable  Complications: No apparent anesthesia complications

## 2011-02-14 NOTE — Progress Notes (Signed)
Discharge instructions given to and reviewed with patient.  Pt verbalized understanding of discharge instructions.2nd copy of discharge instructions signed and place in patient shadow chart. Pt escorted out by  Victorino Dike, Psychologist, sport and exercise.                      Marland Kitchen

## 2011-02-16 NOTE — Discharge Summary (Signed)
NAMEGENIA, PERIN NO.:  [id number]  MEDICAL RECORD NO.:  [id number]  LOCATION:  9320                          FACILITY:  [facility]  PHYSICIAN:  Malva Limes, M.D.    DATE OF BIRTH:  11/30/1965  DATE OF ADMISSION:  02/13/2011 DATE OF DISCHARGE:  02/14/2011                              DISCHARGE SUMMARY   PRINCIPAL DISCHARGE DIAGNOSES: 1. Chronic pelvic pain. 2. Symptomatic uterine fibroids. 3. Minimal endometriosis.  PRINCIPAL PROCEDURES:  Laparoscopic-assisted vaginal hysterectomy with bilateral salpingo-oophorectomy.  HISTORY OF PRESENT ILLNESS:  Ms. Irigoyen is a 45 year old black female, G4, P 2-0-2-2 who presented to [facility] for definitive therapy of her ongoing chronic pelvic pain.  For complete description of the events which led up to this, please see the dictated history and physical.  The patient underwent a laparoscopic-assisted hysterectomy with bilateral salpingo-oophorectomy on February 13, 2011.  A complete description of this can be found in dictated operative note.  The patient's pathology is pending at the time of this dictation.  Her preop hemoglobin was 12.8 and her postop was 10.3.  During her postop period, the patient had no difficulty.  She remained afebrile.  She ambulated difficulty.  At the time of discharge, she was eating regular diet and had adequate pain control.  The patient was sent home with Percocet and Advil.  She was to continue her Detrol which was being used to treat her urge incontinence.  She will follow up in the office in 4 weeks.  She was told to call with any temperature elevation, vaginal bleeding, or severe pain.          ______________________________ Malva Limes, M.D.     MA/MEDQ  D:  02/15/2011  T:  02/16/2011  Job:  [id number]

## 2011-02-19 ENCOUNTER — Encounter (HOSPITAL_COMMUNITY): Payer: Self-pay | Admitting: Obstetrics and Gynecology

## 2011-04-05 ENCOUNTER — Encounter: Payer: Self-pay | Admitting: Family Medicine

## 2011-04-05 ENCOUNTER — Ambulatory Visit (INDEPENDENT_AMBULATORY_CARE_PROVIDER_SITE_OTHER): Payer: BC Managed Care – PPO | Admitting: Family Medicine

## 2011-04-05 VITALS — BP 112/78 | HR 84 | Temp 98.3°F | Resp 18 | Ht 68.0 in | Wt 181.4 lb

## 2011-04-05 DIAGNOSIS — R32 Unspecified urinary incontinence: Secondary | ICD-10-CM | POA: Insufficient documentation

## 2011-04-05 DIAGNOSIS — R05 Cough: Secondary | ICD-10-CM

## 2011-04-05 DIAGNOSIS — J329 Chronic sinusitis, unspecified: Secondary | ICD-10-CM

## 2011-04-05 DIAGNOSIS — R059 Cough, unspecified: Secondary | ICD-10-CM

## 2011-04-05 DIAGNOSIS — J069 Acute upper respiratory infection, unspecified: Secondary | ICD-10-CM

## 2011-04-05 DIAGNOSIS — J019 Acute sinusitis, unspecified: Secondary | ICD-10-CM

## 2011-04-05 MED ORDER — HYDROCODONE-HOMATROPINE 5-1.5 MG/5ML PO SYRP: 5.0000 mL | ORAL_SOLUTION | Freq: Four times a day (QID) | ORAL | Status: AC | PRN

## 2011-04-05 MED ORDER — AZITHROMYCIN 250 MG PO TABS: ORAL_TABLET | ORAL | Status: AC

## 2011-04-05 NOTE — Progress Notes (Signed)
  Subjective:    Patient ID: [name], female    DOB: Feb 25, 1966, 46 y.o.   MRN: [id number]  Cough This is a new problem. The current episode started in the past 7 days. The problem has been gradually worsening. The problem occurs constantly. The cough is non-productive. Associated symptoms include chills, headaches and shortness of breath. She has tried nothing for the symptoms. Her past medical history is significant for bronchitis. There is no history of asthma.  Headache  Associated symptoms include coughing.  Sore Throat  Associated symptoms include coughing, headaches and shortness of breath.  Shortness of Breath Associated symptoms include headaches. There is no history of asthma.      Review of Systems  Constitutional: Positive for chills.  Respiratory: Positive for cough and shortness of breath.   Neurological: Positive for headaches.  All other systems reviewed and are negative.       Objective:   Physical Exam  Constitutional: She appears well-developed and well-nourished.  HENT:  Head: Normocephalic and atraumatic.  Right Ear: External ear normal.  Left Ear: External ear normal.  Mouth/Throat: Oropharynx is clear and moist.  Eyes: Conjunctivae are normal. Pupils are equal, round, and reactive to light.  Neck: Normal range of motion. Neck supple. No thyromegaly present.  Cardiovascular: Normal rate, regular rhythm, normal heart sounds and intact distal pulses.   Pulmonary/Chest: Effort normal and breath sounds normal.          Assessment & Plan:  Acute sinusitis

## 2011-04-05 NOTE — Patient Instructions (Signed)
Upper Respiratory Infection, Adult An upper respiratory infection (URI) is also sometimes known as the common cold. The upper respiratory tract includes the nose, sinuses, throat, trachea, and bronchi. Bronchi are the airways leading to the lungs. Most people improve within 1 week, but symptoms can last up to 2 weeks. A residual cough may last even longer.  CAUSES Many different viruses can infect the tissues lining the upper respiratory tract. The tissues become irritated and inflamed and often become very moist. Mucus production is also common. A cold is contagious. You can easily spread the virus to others by oral contact. This includes kissing, sharing a glass, coughing, or sneezing. Touching your mouth or nose and then touching a surface, which is then touched by another person, can also spread the virus. SYMPTOMS  Symptoms typically develop 1 to 3 days after you come in contact with a cold virus. Symptoms vary from person to person. They may include:  Runny nose.   Sneezing.   Nasal congestion.   Sinus irritation.   Sore throat.   Loss of voice (laryngitis).   Cough.   Fatigue.   Muscle aches.   Loss of appetite.   Headache.   Low-grade fever.  DIAGNOSIS  You might diagnose your own cold based on familiar symptoms, since most people get a cold 2 to 3 times a year. Your caregiver can confirm this based on your exam. Most importantly, your caregiver can check that your symptoms are not due to another disease such as strep throat, sinusitis, pneumonia, asthma, or epiglottitis. Blood tests, throat tests, and X-rays are not necessary to diagnose a common cold, but they may sometimes be helpful in excluding other more serious diseases. Your caregiver will decide if any further tests are required. RISKS AND COMPLICATIONS  You may be at risk for a more severe case of the common cold if you smoke cigarettes, have chronic heart disease (such as heart failure) or lung disease (such as  asthma), or if you have a weakened immune system. The very young and very old are also at risk for more serious infections. Bacterial sinusitis, middle ear infections, and bacterial pneumonia can complicate the common cold. The common cold can worsen asthma and chronic obstructive pulmonary disease (COPD). Sometimes, these complications can require emergency medical care and may be life-threatening. PREVENTION  The best way to protect against getting a cold is to practice good hygiene. Avoid oral or hand contact with people with cold symptoms. Wash your hands often if contact occurs. There is no clear evidence that vitamin C, vitamin E, echinacea, or exercise reduces the chance of developing a cold. However, it is always recommended to get plenty of rest and practice good nutrition. TREATMENT  Treatment is directed at relieving symptoms. There is no cure. Antibiotics are not effective, because the infection is caused by a virus, not by bacteria. Treatment may include:  Increased fluid intake. Sports drinks offer valuable electrolytes, sugars, and fluids.   Breathing heated mist or steam (vaporizer or shower).   Eating chicken soup or other clear broths, and maintaining good nutrition.   Getting plenty of rest.   Using gargles or lozenges for comfort.   Controlling fevers with ibuprofen or acetaminophen as directed by your caregiver.   Increasing usage of your inhaler if you have asthma.  Zinc gel and zinc lozenges, taken in the first 24 hours of the common cold, can shorten the duration and lessen the severity of symptoms. Pain medicines may help with fever, muscle   aches, and throat pain. A variety of non-prescription medicines are available to treat congestion and runny nose. Your caregiver can make recommendations and may suggest nasal or lung inhalers for other symptoms.  HOME CARE INSTRUCTIONS   Only take over-the-counter or prescription medicines for pain, discomfort, or fever as directed  by your caregiver.   Use a warm mist humidifier or inhale steam from a shower to increase air moisture. This may keep secretions moist and make it easier to breathe.   Drink enough water and fluids to keep your urine clear or pale yellow.   Rest as needed.  Return to work when your temperature has returned to normal or as your caregiver advises. You may need to stay home longer to avoid infecting others. You can also use a face mask and careful hand washing to prevent spread of the virus. Sinusitis Sinuses are air pockets within the bones of your face. The growth of bacteria within a sinus leads to infection. The infection prevents the sinuses from draining. This infection is called sinusitis. SYMPTOMS  There will be different areas of pain depending on which sinuses have become infected. The maxillary sinuses often produce pain beneath the eyes.  Frontal sinusitis may cause pain in the middle of the forehead and above the eyes.  Other problems (symptoms) include: Toothaches.  Colored, pus-like (purulent) drainage from the nose.  Swelling, warmth, and tenderness over the sinus areas may be signs of infection.  TREATMENT  Sinusitis is most often determined by an exam.X-rays may be taken. If x-rays have been taken, make sure you obtain your results or find out how you are to obtain them. Your caregiver may give you medications (antibiotics). These are medications that will help kill the bacteria causing the infection. You may also be given a medication (decongestant) that helps to reduce sinus swelling.  HOME CARE INSTRUCTIONS  Only take over-the-counter or prescription medicines for pain, discomfort, or fever as directed by your caregiver.  Drink extra fluids. Fluids help thin the mucus so your sinuses can drain more easily.  Applying either moist heat or ice packs to the sinus areas may help relieve discomfort.  Use saline nasal sprays to help moisten your sinuses. The sprays can be found at  your local drugstore.  SEEK IMMEDIATE MEDICAL CARE IF: You have a fever.  You have increasing pain, severe headaches, or toothache.  You have nausea, vomiting, or drowsiness.  You develop unusual swelling around the face or trouble seeing.  MAKE SURE YOU:  Understand these instructions.  Will watch your condition.  Will get help right away if you are not doing well or get worse.  Document Released: 02/10/2005 Document Revised: 10/23/2010 Document Reviewed: 09/09/2006  [facility] Patient Information 2012 [place], [place]. SEEK MEDICAL CARE IF:   After the first few days, you feel you are getting worse rather than better.   You need your caregiver's advice about medicines to control symptoms.   You develop chills, worsening shortness of breath, or brown or red sputum. These may be signs of pneumonia.   You develop yellow or brown nasal discharge or pain in the face, especially when you bend forward. These may be signs of sinusitis.   You develop a fever, swollen neck glands, pain with swallowing, or white areas in the back of your throat. These may be signs of strep throat.  SEEK IMMEDIATE MEDICAL CARE IF:   You have a fever.   You develop severe or persistent headache, ear pain, sinus pain,  or chest pain.   You develop wheezing, a prolonged cough, cough up blood, or have a change in your usual mucus (if you have chronic lung disease).   You develop sore muscles or a stiff neck.  Document Released: 08/06/2000 Document Revised: 10/23/2010 Document Reviewed: 06/14/2010 [facility] Patient Information 2012 [place], [place].

## 2011-06-25 ENCOUNTER — Emergency Department (HOSPITAL_COMMUNITY)
Admission: EM | Admit: 2011-06-25 | Discharge: 2011-06-25 | Disposition: A | Discharge status: Home / Self Care | Payer: BC Managed Care – PPO | Source: Home / Self Care | Attending: Family Medicine | Admitting: Family Medicine

## 2011-06-25 ENCOUNTER — Encounter (HOSPITAL_COMMUNITY): Payer: Self-pay

## 2011-06-25 DIAGNOSIS — I10 Essential (primary) hypertension: Secondary | ICD-10-CM

## 2011-06-25 DIAGNOSIS — T7840XA Allergy, unspecified, initial encounter: Secondary | ICD-10-CM

## 2011-06-25 MED ORDER — PREDNISONE (PAK) 10 MG PO TABS: ORAL_TABLET | ORAL | Status: AC

## 2011-06-25 MED ORDER — METHYLPREDNISOLONE SODIUM SUCC 125 MG IJ SOLR
125.0000 mg | Freq: Once | INTRAMUSCULAR | Status: AC
  Administered 2011-06-25: 125 mg via INTRAMUSCULAR

## 2011-06-25 MED ORDER — METHYLPREDNISOLONE SODIUM SUCC 125 MG IJ SOLR
INTRAMUSCULAR | Status: AC
Start: 2011-06-25 — End: 2011-06-25
  Filled 2011-06-25: qty 2

## 2011-06-25 NOTE — Discharge Instructions (Signed)
Take prednisone dosepak as prescribed. Continue to use antihistamines such as Benadryl or Claritin, as directed, and until symptoms resolve. Return to care should your symptoms not improve, or worsen in any way, such as shortness of breath, difficulty swallowing liquids or saliva, swelling of the face, lips, or tongue, chest pain or discomfort, visual changes, headaches, rash, itching, or any other signs or symptoms or a reaction.

## 2011-06-25 NOTE — ED Notes (Signed)
Reports allergic reaction aprox 30 min PTA, including sniffles, eye red, throat tight; NAD at present

## 2011-06-25 NOTE — ED Provider Notes (Signed)
History     CSN: [id number]  Arrival date & time 06/25/11  4098   First MD Initiated Contact with Patient 06/25/11 [phone]      Chief Complaint  Patient presents with  . Allergic Reaction    (Consider location/radiation/quality/duration/timing/severity/associated sxs/prior treatment) HPI Comments: Ocia presents for evaluation for suspected allergic reaction to an unknown substance. She reports onset of symptoms 30 minutes prior to arrival. She reports red eyes, rhinorrhea, throat itching and sore, tingling in her lips and vaginal area, and vaginal itching as well. She reports a history of multiple allergies to various substances, and is currently undergoing allergy testing. She does carry an EpiPen. She did not use it today. She did take a Benadryl prior to coming. She denies any difficulty swallowing, no shortness of breath, no chest pain, no palpitations, no numbness, tingling, or weakness. She denies any visual disturbances.  Patient is a 46 y.o. female presenting with allergic reaction. The history is provided by the patient.  Allergic Reaction The primary symptoms do not include wheezing, shortness of breath, cough, nausea, vomiting, dizziness, palpitations, rash or urticaria. The current episode started less than 1 hour ago. The problem has not changed since onset. Significant symptoms also include eye redness and rhinorrhea.    Past Medical History  Diagnosis Date  . No pertinent past medical history   . Complication of anesthesia   . PONV (postoperative nausea and vomiting)     nausea with dizziness    Past Surgical History  Procedure Date  . Endometrial ablation   . Tonsillectomy   . Cholecystectomy   . Knee arthroscopy   . Laparoscopic assisted vaginal hysterectomy 02/13/2011    Procedure: LAPAROSCOPIC ASSISTED VAGINAL HYSTERECTOMY;  Surgeon: Levi Aland;  Location: [facility];  Service: Gynecology;  Laterality: N/A;  . Salpingoophorectomy 02/13/2011    Procedure:  SALPINGO OOPHERECTOMY;  Surgeon: Levi Aland;  Location: [facility];  Service: Gynecology;  Laterality: Bilateral;  . Abdominal hysterectomy     History reviewed. No pertinent family history.  History  Substance Use Topics  . Smoking status: Never Smoker   . Smokeless tobacco: Not on file  . Alcohol Use: Yes     occasionally    OB History    Grav Para Term Preterm Abortions TAB SAB Ect Mult Living                  Review of Systems  Constitutional: Negative.   HENT: Positive for rhinorrhea. Negative for sore throat and trouble swallowing.   Eyes: Positive for redness.  Respiratory: Negative.  Negative for cough, shortness of breath and wheezing.   Cardiovascular: Negative.  Negative for palpitations.  Gastrointestinal: Negative.  Negative for nausea and vomiting.  Genitourinary: Negative.   Musculoskeletal: Negative.   Skin: Negative.  Negative for rash.  Neurological: Negative.  Negative for dizziness.    Allergies  Fish allergy; Penicillins; Augmentin; Betadine; and Tetanus toxoids  Home Medications   Current Outpatient Rx  Name Route Sig Dispense Refill  . PREDNISONE (PAK) 10 MG PO TABS  Take 6 tablets on day 1, 5 tablets on day 2, 4 tablets on day 3, 3 tablets on day 4, 2 tablets on day 5, 1 tablet on day 6 21 tablet 0  . TOLTERODINE TARTRATE ER 4 MG PO CP24 Oral Take 4 mg by mouth daily.        BP 122/81  Pulse 89  Temp(Src) 98.2 F (36.8 C) (Oral)  Resp 20  SpO2 99%  Physical Exam  Nursing note and vitals reviewed. Constitutional: She is oriented to person, place, and time. She appears well-developed and well-nourished.  HENT:  Head: Normocephalic and atraumatic.  Right Ear: Tympanic membrane normal.  Left Ear: Tympanic membrane normal.  Nose: Nose normal.  Mouth/Throat: Uvula is midline, oropharynx is clear and moist and mucous membranes are normal.  Eyes: EOM and lids are normal. Pupils are equal, round, and reactive to light. Right conjunctiva is  injected. Left conjunctiva is injected.  Neck: Normal range of motion.  Cardiovascular: Normal rate, regular rhythm, S1 normal, S2 normal and normal heart sounds.   No murmur heard. Pulmonary/Chest: Effort normal and breath sounds normal. She has no decreased breath sounds. She has no wheezes. She has no rhonchi.  Musculoskeletal: Normal range of motion.  Neurological: She is alert and oriented to person, place, and time.  Skin: Skin is warm and dry.  Psychiatric: Her behavior is normal.    ED Course  Procedures (including critical care time)  Labs Reviewed - No data to display No results found.   1. Allergic reaction       MDM  Given solumedrol 125 mg IM x 1 in clinic with improvement of symptoms; given rx for prednisone dosepak; advised to continue Benadryl        Renaee Munda, MD 06/25/11 1009

## 2012-02-03 ENCOUNTER — Ambulatory Visit (INDEPENDENT_AMBULATORY_CARE_PROVIDER_SITE_OTHER): Payer: BC Managed Care – PPO | Admitting: Internal Medicine

## 2012-02-03 VITALS — BP 105/69 | HR 98 | Temp 98.5°F | Resp 20 | Ht 68.0 in | Wt 187.0 lb

## 2012-02-03 DIAGNOSIS — R61 Generalized hyperhidrosis: Secondary | ICD-10-CM

## 2012-02-03 DIAGNOSIS — R05 Cough: Secondary | ICD-10-CM

## 2012-02-03 DIAGNOSIS — R079 Chest pain, unspecified: Secondary | ICD-10-CM

## 2012-02-03 DIAGNOSIS — R059 Cough, unspecified: Secondary | ICD-10-CM

## 2012-02-03 DIAGNOSIS — IMO0001 Reserved for inherently not codable concepts without codable children: Secondary | ICD-10-CM

## 2012-02-03 DIAGNOSIS — M791 Myalgia, unspecified site: Secondary | ICD-10-CM

## 2012-02-03 LAB — POCT INFLUENZA A/B: Influenza A, POC: NEGATIVE

## 2012-02-03 MED ORDER — HYDROCODONE-ACETAMINOPHEN 7.5-325 MG/15ML PO SOLN: 5.0000 mL | Freq: Four times a day (QID) | ORAL | Status: DC | PRN

## 2012-02-03 MED ORDER — AZITHROMYCIN 500 MG PO TABS: 500.0000 mg | ORAL_TABLET | Freq: Every day | ORAL | Status: DC

## 2012-02-03 NOTE — Patient Instructions (Addendum)

## 2012-02-03 NOTE — Progress Notes (Signed)
  Subjective:    Patient ID: [name], female    DOB: 1965/09/28, 46 y.o.   MRN: [id number]  HPI Cough, body aches, sweats No sob, has cp with cough.   Review of Systems     Objective:   Physical Exam  Vitals reviewed. Constitutional: She appears well-nourished. No distress.  HENT:  Right Ear: External ear normal.  Left Ear: External ear normal.  Mouth/Throat: Oropharynx is clear and moist.  Eyes: EOM are normal.  Neck: Neck supple.  Cardiovascular: Normal rate, regular rhythm and normal heart sounds.   Pulmonary/Chest: Not tachypneic. She has no decreased breath sounds. She has no wheezes. She has rhonchi. She has no rales.          Assessment & Plan:  Bronchitis Zithromax 500mg Leandro Reasoner

## 2012-04-25 ENCOUNTER — Ambulatory Visit (INDEPENDENT_AMBULATORY_CARE_PROVIDER_SITE_OTHER): Payer: BC Managed Care – PPO | Admitting: Emergency Medicine

## 2012-04-25 VITALS — BP 119/77 | HR 88 | Temp 98.3°F | Resp 16 | Ht 68.0 in | Wt 192.0 lb

## 2012-04-25 DIAGNOSIS — J018 Other acute sinusitis: Secondary | ICD-10-CM

## 2012-04-25 MED ORDER — PSEUDOEPHEDRINE-GUAIFENESIN ER 60-600 MG PO TB12: 1.0000 | ORAL_TABLET | Freq: Two times a day (BID) | ORAL | Status: AC

## 2012-04-25 MED ORDER — LEVOFLOXACIN 500 MG PO TABS: 500.0000 mg | ORAL_TABLET | Freq: Every day | ORAL | Status: AC

## 2012-04-25 NOTE — Progress Notes (Signed)
[facility] [address] [place] [address] [phone]  Date:  04/25/2012   Name:  [name]   DOB:  04-Jun-1965   MRN:  [id number]  PCP:  No primary provider on file.    Chief Complaint: Sinus Problem   History of Present Illness:  [name] is a 47 y.o. very pleasant female patient who presents with the following:  Ill with nasal congestion and purulent discharge.  Has moderate post nasal drainage.  Intermittent sore throat.  Has a nonproductive cough not accompanied by wheezing or shortness of breath.  No nausea or vomiting.  No fever or chills.  Ill since last week.  No improvement with over the counter medications or other home remedies.   Patient Active Problem List  Diagnosis  . Urinary incontinence    Past Medical History  Diagnosis Date  . No pertinent past medical history   . Complication of anesthesia   . PONV (postoperative nausea and vomiting)     nausea with dizziness  . Allergy   . Arthritis     Past Surgical History  Procedure Laterality Date  . Endometrial ablation    . Tonsillectomy    . Cholecystectomy    . Knee arthroscopy    . Laparoscopic assisted vaginal hysterectomy  02/13/2011    Procedure: LAPAROSCOPIC ASSISTED VAGINAL HYSTERECTOMY;  Surgeon: Levi Aland;  Location: [facility];  Service: Gynecology;  Laterality: N/A;  . Salpingoophorectomy  02/13/2011    Procedure: SALPINGO OOPHERECTOMY;  Surgeon: Levi Aland;  Location: [facility];  Service: Gynecology;  Laterality: Bilateral;  . Abdominal hysterectomy      History  Substance Use Topics  . Smoking status: Never Smoker   . Smokeless tobacco: Not on file  . Alcohol Use: 1.2 oz/week    2 Glasses of wine per week     Comment: occasionally    Family History  Problem Relation Age of Onset  . Cancer Mother   . Heart disease Father   . Cancer Brother   . Mental illness Son   . Cancer Paternal Aunt   . Cancer Paternal Uncle   . Cancer Maternal Aunt      Allergies  Allergen Reactions  . Fish Allergy Hives  . Penicillins Hives  . Augmentin (Amoxicillin-Pot Clavulanate) Hives  . Betadine (Povidone Iodine) Hives    Bad hives per pt  . Tetanus Toxoids Hives    Medication list has been reviewed and updated.  Current Outpatient Prescriptions on File Prior to Visit  Medication Sig Dispense Refill  . Doxylamine-DM (VICKS DAYQUIL/NYQUIL COUGH PO) Take 30 mLs by mouth at bedtime and may repeat dose one time if needed.      . tolterodine (DETROL LA) 4 MG 24 hr capsule Take 4 mg by mouth daily.        Marland Kitchen azithromycin (ZITHROMAX) 500 MG tablet Take 1 tablet (500 mg total) by mouth daily.  5 tablet  0  . hydrocodone-acetaminophen (HYCET) 7.5-325 MG/15ML solution Take 5 mLs by mouth every 6 (six) hours as needed for pain (or cough).  240 mL  0  . Multiple Vitamin (MULTIVITAMIN) tablet Take 1 tablet by mouth daily.       No current facility-administered medications on file prior to visit.    Review of Systems:  As per HPI, otherwise negative.    Physical Examination: Filed Vitals:   04/25/12 1143  BP: 119/77  Pulse: 88  Temp: 98.3 F (36.8 C)  Resp: 16   Filed Vitals:   04/25/12 1143  Height: 5\' 8"  (1.727 m)  Weight: 192 lb (87.091 kg)   Body mass index is 29.2 kg/(m^2). Ideal Body Weight: Weight in (lb) to have BMI = 25: 164.1  GEN: WDWN, NAD, Non-toxic, A & O x 3 HEENT: Atraumatic, Normocephalic. Neck supple. No masses, No LAD. Ears and Nose: No external deformity. CV: RRR, No M/G/R. No JVD. No thrill. No extra heart sounds. PULM: CTA B, no wheezes, crackles, rhonchi. No retractions. No resp. distress. No accessory muscle use. ABD: S, NT, ND, +BS. No rebound. No HSM. EXTR: No c/c/e NEURO Normal gait.  PSYCH: Normally interactive. Conversant. Not depressed or anxious appearing.  Calm demeanor.    Assessment and Plan: Sinusitis levaquin mucinex d  Carmelina Dane, MD

## 2018-02-24 DIAGNOSIS — C3491 Malignant neoplasm of unspecified part of right bronchus or lung: Secondary | ICD-10-CM

## 2018-02-24 HISTORY — PX: LUNG LOBECTOMY: SHX167

## 2018-02-24 HISTORY — DX: Malignant neoplasm of unspecified part of right bronchus or lung: C34.91

## 2018-07-08 DIAGNOSIS — C3411 Malignant neoplasm of upper lobe, right bronchus or lung: Secondary | ICD-10-CM | POA: Insufficient documentation

## 2020-05-13 ENCOUNTER — Emergency Department (HOSPITAL_COMMUNITY): Payer: BLUE CROSS/BLUE SHIELD

## 2020-05-13 ENCOUNTER — Emergency Department (HOSPITAL_COMMUNITY)
Admission: EM | Admit: 2020-05-13 | Discharge: 2020-05-13 | Disposition: A | Discharge status: Home / Self Care | Payer: BLUE CROSS/BLUE SHIELD | Attending: Emergency Medicine | Admitting: Emergency Medicine

## 2020-05-13 DIAGNOSIS — J189 Pneumonia, unspecified organism: Secondary | ICD-10-CM | POA: Diagnosis not present

## 2020-05-13 DIAGNOSIS — R251 Tremor, unspecified: Secondary | ICD-10-CM | POA: Insufficient documentation

## 2020-05-13 DIAGNOSIS — R0602 Shortness of breath: Secondary | ICD-10-CM

## 2020-05-13 DIAGNOSIS — R059 Cough, unspecified: Secondary | ICD-10-CM | POA: Diagnosis present

## 2020-05-13 DIAGNOSIS — R4182 Altered mental status, unspecified: Secondary | ICD-10-CM

## 2020-05-13 LAB — RAPID URINE DRUG SCREEN, HOSP PERFORMED
Amphetamines: NOT DETECTED
Barbiturates: NOT DETECTED
Benzodiazepines: NOT DETECTED
Cocaine: NOT DETECTED
Opiates: NOT DETECTED
Tetrahydrocannabinol: POSITIVE — AB

## 2020-05-13 LAB — COMPREHENSIVE METABOLIC PANEL
ALT: 30 U/L (ref 0–44)
AST: 26 U/L (ref 15–41)
Albumin: 3.8 g/dL (ref 3.5–5.0)
Alkaline Phosphatase: 70 U/L (ref 38–126)
Anion gap: 8 (ref 5–15)
BUN: 21 mg/dL — ABNORMAL HIGH (ref 6–20)
CO2: 24 mmol/L (ref 22–32)
Calcium: 9.3 mg/dL (ref 8.9–10.3)
Chloride: 104 mmol/L (ref 98–111)
Creatinine, Ser: 1.17 mg/dL — ABNORMAL HIGH (ref 0.44–1.00)
GFR, Estimated: 54 mL/min — ABNORMAL LOW (ref >60)
Glucose, Bld: 131 mg/dL — ABNORMAL HIGH (ref 70–99)
Potassium: 3.8 mmol/L (ref 3.5–5.1)
Sodium: 136 mmol/L (ref 135–145)
Total Bilirubin: 0.6 mg/dL (ref 0.3–1.2)
Total Protein: 7.2 g/dL (ref 6.5–8.1)

## 2020-05-13 LAB — CBC WITH DIFFERENTIAL/PLATELET
Abs Immature Granulocytes: 0.02 10*3/uL (ref 0.00–0.07)
Basophils Absolute: 0 10*3/uL (ref 0.0–0.1)
Basophils Relative: 0 %
Eosinophils Absolute: 0.1 10*3/uL (ref 0.0–0.5)
Eosinophils Relative: 1 %
HCT: 37.7 % (ref 36.0–46.0)
Hemoglobin: 11.9 g/dL — ABNORMAL LOW (ref 12.0–15.0)
Immature Granulocytes: 0 %
Lymphocytes Relative: 16 %
Lymphs Abs: 1.5 10*3/uL (ref 0.7–4.0)
MCH: 26.3 pg (ref 26.0–34.0)
MCHC: 31.6 g/dL (ref 30.0–36.0)
MCV: 83.2 fL (ref 80.0–100.0)
Monocytes Absolute: 0.9 10*3/uL (ref 0.1–1.0)
Monocytes Relative: 10 %
Neutro Abs: 6.7 10*3/uL (ref 1.7–7.7)
Neutrophils Relative %: 73 %
Platelets: 205 10*3/uL (ref 150–400)
RBC: 4.53 MIL/uL (ref 3.87–5.11)
RDW: 14.4 % (ref 11.5–15.5)
WBC: 9.2 10*3/uL (ref 4.0–10.5)
nRBC: 0 % (ref 0.0–0.2)

## 2020-05-13 MED ORDER — LACTATED RINGERS IV BOLUS
1000.0000 mL | Freq: Once | INTRAVENOUS | Status: AC
  Administered 2020-05-13: 1000 mL via INTRAVENOUS

## 2020-05-13 MED ORDER — DOXYCYCLINE HYCLATE 100 MG PO CAPS: 100.0000 mg | ORAL_CAPSULE | Freq: Two times a day (BID) | ORAL | 0 refills | Status: DC

## 2020-05-13 MED ORDER — DOXYCYCLINE HYCLATE 100 MG PO TABS
100.0000 mg | ORAL_TABLET | Freq: Once | ORAL | Status: AC
  Administered 2020-05-13: 100 mg via ORAL
  Filled 2020-05-13: qty 1

## 2020-05-13 MED ORDER — LORAZEPAM 2 MG/ML IJ SOLN
2.0000 mg | Freq: Once | INTRAMUSCULAR | Status: AC
  Administered 2020-05-13: 2 mg via INTRAVENOUS
  Filled 2020-05-13: qty 1

## 2020-05-13 NOTE — ED Triage Notes (Signed)
Pt BIB EMS, son called 911 after pt was unable to answer door of her home to let him in. Pt A&O x4. Pt states to this RN that she ingested an "edible" before symptoms began. Last known well 2000 03/19. Pt continuously twitching on arrival.   EMS vitals:  BP:116/58 HR: 120  Spo2: 100% on NRB

## 2020-05-13 NOTE — ED Notes (Signed)
Patient transported to CT 

## 2020-05-13 NOTE — ED Provider Notes (Signed)
[place] EMERGENCY DEPARTMENT Provider Note   CSN: [id number] Arrival date & time: 05/13/20  7846     History Chief Complaint  Patient presents with  . Altered Mental Status    [name] is a 55 y.o. female.  Patient apparently ate some type of edible about an hour prior to symptoms starting.  She also took 2 Tylenol PM.  She states that she has had a little bit of cough and stuff recently.  She stated that she started having confusion and feeling weird.  She started having some abnormal shaking movement EMS was called.  They thought that she was hypoxic so brought her in on nonrebreather however after arrival it was found she was not hypoxic.  She was tachycardic with them.  Normal blood pressures with them.  No other associated symptoms.   Altered Mental Status Presenting symptoms: behavior changes and confusion   Severity:  Severe Most recent episode:  Today Episode history:  Continuous Timing:  Constant Chronicity:  New Context: drug use   Associated symptoms: no abdominal pain, no fever, no rash and no seizures        Past Medical History:  Diagnosis Date  . Allergy   . Arthritis   . Complication of anesthesia   . No pertinent past medical history   . PONV (postoperative nausea and vomiting)    nausea with dizziness    Patient Active Problem List   Diagnosis Date Noted  . Urinary incontinence 04/05/2011    Past Surgical History:  Procedure Laterality Date  . ABDOMINAL HYSTERECTOMY    . CHOLECYSTECTOMY    . ENDOMETRIAL ABLATION    . KNEE ARTHROSCOPY    . LAPAROSCOPIC ASSISTED VAGINAL HYSTERECTOMY  02/13/2011   Procedure: LAPAROSCOPIC ASSISTED VAGINAL HYSTERECTOMY;  Surgeon: Olga Millers;  Location: [facility];  Service: Gynecology;  Laterality: N/A;  . SALPINGOOPHORECTOMY  02/13/2011   Procedure: SALPINGO OOPHERECTOMY;  Surgeon: Olga Millers;  Location: [facility];  Service: Gynecology;  Laterality: Bilateral;  . TONSILLECTOMY        OB History   No obstetric history on file.     Family History  Problem Relation Age of Onset  . Cancer Mother   . Heart disease Father   . Cancer Brother   . Mental illness Son   . Cancer Paternal Aunt   . Cancer Paternal Uncle   . Cancer Maternal Aunt     Social History   Tobacco Use  . Smoking status: Never Smoker  Substance Use Topics  . Alcohol use: Yes    Alcohol/week: 2.0 standard drinks    Types: 2 Glasses of wine per week    Comment: occasionally  . Drug use: No    Home Medications Prior to Admission medications   Medication Sig Start Date End Date Taking? Authorizing Provider  diphenhydrAMINE-APAP, sleep, (TYLENOL PM EXTRA STRENGTH PO) Take by mouth.   Yes [provider]  doxycycline (VIBRAMYCIN) 100 MG capsule Take 1 capsule (100 mg total) by mouth 2 (two) times daily. One po bid x 7 days 05/13/20  Yes Mesner, Corene Cornea, MD  Multiple Vitamin (MULTIVITAMIN) tablet Take 1 tablet by mouth daily.   Yes [provider]  TAGRISSO 80 MG tablet Take 80 mg by mouth daily. 05/04/20  Yes [provider]    Allergies    Fish allergy, Penicillins, Augmentin [amoxicillin-pot clavulanate], Betadine [povidone iodine], and Tetanus toxoids  Review of Systems   Review of Systems  Constitutional: Negative  for fever.  Gastrointestinal: Negative for abdominal pain.  Skin: Negative for rash.  Neurological: Negative for seizures.  Psychiatric/Behavioral: Positive for confusion.  All other systems reviewed and are negative.   Physical Exam Updated Vital Signs BP 124/88   Pulse (!) 106   Temp 98.3 F (36.8 C) (Oral)   Resp (!) 23   Ht 5\' 9"  (1.753 m)   Wt 88.5 kg   SpO2 100%   BMI 28.80 kg/m   Physical Exam Vitals and nursing note reviewed.  Constitutional:      Appearance: She is well-developed.  HENT:     Head: Normocephalic and atraumatic.     Mouth/Throat:     Mouth: Mucous membranes are moist.     Pharynx: Oropharynx is clear.   Eyes:     Pupils: Pupils are equal, round, and reactive to light.  Cardiovascular:     Rate and Rhythm: Normal rate and regular rhythm.  Pulmonary:     Effort: No respiratory distress.     Breath sounds: No stridor.  Abdominal:     General: Abdomen is flat. There is no distension.  Musculoskeletal:        General: No swelling or tenderness. Normal range of motion.     Cervical back: Normal range of motion.  Skin:    General: Skin is warm and dry.  Neurological:     General: No focal deficit present.     Mental Status: She is alert.     Comments: Abnormal almost chorioid type movements on arrival.  Patient is alert and able to answer questions through it.  Open all extremities in a nonrhythmic motion.     ED Results / Procedures / Treatments   Labs (all labs ordered are listed, but only abnormal results are displayed) Labs Reviewed  CBC WITH DIFFERENTIAL/PLATELET - Abnormal; Notable for the following components:      Result Value   Hemoglobin 11.9 (*)    All other components within normal limits  COMPREHENSIVE METABOLIC PANEL - Abnormal; Notable for the following components:   Glucose, Bld 131 (*)    BUN 21 (*)    Creatinine, Ser 1.17 (*)    GFR, Estimated 54 (*)    All other components within normal limits  RAPID URINE DRUG SCREEN, HOSP PERFORMED - Abnormal; Notable for the following components:   Tetrahydrocannabinol POSITIVE (*)    All other components within normal limits    EKG None  Radiology DG Chest 1 View  Result Date: 05/13/2020 CLINICAL DATA:  Altered mental status EXAM: CHEST  1 VIEW COMPARISON:  Radiograph 02/14/2010 FINDINGS: Some patchy opacities are present in the left lung base and periphery and minimally in the right lung base as well with associated airways thickening. No pneumothorax or visible effusion. The cardiomediastinal contours are unremarkable. No acute osseous or soft tissue abnormality. Telemetry leads overlie the chest. IMPRESSION: Patchy  opacities and airways thickening, worrisome for multifocal pneumonia including atypical viral etiologies. Electronically Signed   By: Lovena Le M.D.   On: 05/13/2020 04:57   CT Head Wo Contrast  Result Date: 05/13/2020 CLINICAL DATA:  Cerebral hemorrhage suspected, altered mental status EXAM: CT HEAD WITHOUT CONTRAST TECHNIQUE: Contiguous axial images were obtained from the base of the skull through the vertex without intravenous contrast. COMPARISON:  None. FINDINGS: Brain: Motion degraded imaging, particularly towards the vertex. No evidence of acute infarction, hemorrhage, hydrocephalus, extra-axial collection, visible mass lesion or mass effect. Vascular: No hyperdense vessel or unexpected calcification. Skull: No  calvarial fracture or suspicious osseous lesion. No scalp swelling or hematoma. Sinuses/Orbits: No gross abnormality of the sinuses orbits though evaluation limited by motion artifact. Mastoid air cells are predominantly clear. Small amount of debris in the right external auditory canal. 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 IMPRESSION: 1. Motion degraded imaging, particularly towards the vertex. 2. No definite acute intracranial abnormality. 3. Small amount of debris in the right external auditory canal, correlate for cerumen impaction. 4. 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 Electronically Signed   By: Lovena Le M.D.   On: 05/13/2020 04:56    Procedures Procedures   Medications Ordered in ED Medications  LORazepam (ATIVAN) injection 2 mg (2 mg Intravenous Given 05/13/20 0344)  lactated ringers bolus 1,000 mL (0 mLs Intravenous Stopped 05/13/20 0513)  doxycycline (VIBRA-TABS) tablet 100 mg (100 mg Oral Given 05/13/20 0542)  lactated ringers bolus 1,000 mL (0 mLs Intravenous Stopped 05/13/20 0742)    ED Course  I have reviewed the triage vital signs and the nursing notes.  Pertinent labs & imaging results that were available during my care of the patient were  reviewed by me and considered in my medical decision making (see chart for details).    MDM Rules/Calculators/A&P                          Suspect drug ingestion.  Ativan given with resolution of symptoms.  Still a bit tachycardic so continue to give fluids until she started the urine.  Patient with improvement in her symptoms.  She does have a history of lung cancer so CT of her head was done to make sure there is no obvious intracranial causes. Will continue to allow her to metabolize.   Xray concerning for multifocal pneumonia. She does endorse recent cough/sinus congestion and not feeling well. Will start doxycycline.   Patient with progressively improving symptoms. Feels well. Ambulates. Eats. Drinks. Oriented. Sister to stay with her today to ensure continued improvement. HR improved with fluids and ativan, doubt sepsis or other emergent causes (related to pneumonia on XR).   Final Clinical Impression(s) / ED Diagnoses Final diagnoses:  Altered mental status, unspecified altered mental status type  Community acquired pneumonia, unspecified laterality    Rx / DC Orders ED Discharge Orders         Ordered    doxycycline (VIBRAMYCIN) 100 MG capsule  2 times daily        05/13/20 0748           Mesner, Corene Cornea, MD 05/13/20 2350

## 2020-05-13 NOTE — ED Notes (Signed)
Ambulated to bathroom, sister at bedside, drowsy though a/o times 4

## 2020-05-13 NOTE — ED Notes (Signed)
Waiting on pt to dress to d/c home with sister

## 2021-02-24 DIAGNOSIS — C799 Secondary malignant neoplasm of unspecified site: Secondary | ICD-10-CM

## 2021-02-24 HISTORY — DX: Secondary malignant neoplasm of unspecified site: C79.9

## 2021-08-21 ENCOUNTER — Other Ambulatory Visit: Payer: Self-pay

## 2021-08-21 ENCOUNTER — Emergency Department (HOSPITAL_COMMUNITY): Payer: BLUE CROSS/BLUE SHIELD

## 2021-08-21 ENCOUNTER — Encounter (HOSPITAL_COMMUNITY): Payer: Self-pay | Admitting: Emergency Medicine

## 2021-08-21 ENCOUNTER — Inpatient Hospital Stay (HOSPITAL_COMMUNITY)
Admission: EM | Admit: 2021-08-21 | Discharge: 2021-08-26 | DRG: 175 | Disposition: A | Discharge status: Home / Self Care | Payer: BLUE CROSS/BLUE SHIELD | Attending: Internal Medicine | Admitting: Internal Medicine

## 2021-08-21 DIAGNOSIS — K5903 Drug induced constipation: Secondary | ICD-10-CM | POA: Diagnosis present

## 2021-08-21 DIAGNOSIS — C7802 Secondary malignant neoplasm of left lung: Secondary | ICD-10-CM | POA: Diagnosis present

## 2021-08-21 DIAGNOSIS — Z902 Acquired absence of lung [part of]: Secondary | ICD-10-CM

## 2021-08-21 DIAGNOSIS — I2694 Multiple subsegmental pulmonary emboli without acute cor pulmonale: Secondary | ICD-10-CM | POA: Diagnosis not present

## 2021-08-21 DIAGNOSIS — Z90722 Acquired absence of ovaries, bilateral: Secondary | ICD-10-CM

## 2021-08-21 DIAGNOSIS — Z90710 Acquired absence of both cervix and uterus: Secondary | ICD-10-CM

## 2021-08-21 DIAGNOSIS — Z9079 Acquired absence of other genital organ(s): Secondary | ICD-10-CM

## 2021-08-21 DIAGNOSIS — Z880 Allergy status to penicillin: Secondary | ICD-10-CM

## 2021-08-21 DIAGNOSIS — Z881 Allergy status to other antibiotic agents status: Secondary | ICD-10-CM

## 2021-08-21 DIAGNOSIS — I2699 Other pulmonary embolism without acute cor pulmonale: Secondary | ICD-10-CM | POA: Diagnosis not present

## 2021-08-21 DIAGNOSIS — C7801 Secondary malignant neoplasm of right lung: Secondary | ICD-10-CM | POA: Diagnosis present

## 2021-08-21 DIAGNOSIS — C786 Secondary malignant neoplasm of retroperitoneum and peritoneum: Secondary | ICD-10-CM | POA: Diagnosis present

## 2021-08-21 DIAGNOSIS — C799 Secondary malignant neoplasm of unspecified site: Secondary | ICD-10-CM

## 2021-08-21 DIAGNOSIS — Z85118 Personal history of other malignant neoplasm of bronchus and lung: Secondary | ICD-10-CM

## 2021-08-21 DIAGNOSIS — J9601 Acute respiratory failure with hypoxia: Secondary | ICD-10-CM | POA: Diagnosis present

## 2021-08-21 DIAGNOSIS — Z888 Allergy status to other drugs, medicaments and biological substances status: Secondary | ICD-10-CM

## 2021-08-21 DIAGNOSIS — Z91013 Allergy to seafood: Secondary | ICD-10-CM

## 2021-08-21 DIAGNOSIS — T40605A Adverse effect of unspecified narcotics, initial encounter: Secondary | ICD-10-CM | POA: Diagnosis present

## 2021-08-21 DIAGNOSIS — Z79899 Other long term (current) drug therapy: Secondary | ICD-10-CM

## 2021-08-21 DIAGNOSIS — Z9049 Acquired absence of other specified parts of digestive tract: Secondary | ICD-10-CM

## 2021-08-21 DIAGNOSIS — M1990 Unspecified osteoarthritis, unspecified site: Secondary | ICD-10-CM | POA: Diagnosis present

## 2021-08-21 LAB — CBC
HCT: 34 % — ABNORMAL LOW (ref 36.0–46.0)
Hemoglobin: 10.5 g/dL — ABNORMAL LOW (ref 12.0–15.0)
MCH: 23.9 pg — ABNORMAL LOW (ref 26.0–34.0)
MCHC: 30.9 g/dL (ref 30.0–36.0)
MCV: 77.4 fL — ABNORMAL LOW (ref 80.0–100.0)
Platelets: 384 10*3/uL (ref 150–400)
RBC: 4.39 MIL/uL (ref 3.87–5.11)
RDW: 14.3 % (ref 11.5–15.5)
WBC: 17 10*3/uL — ABNORMAL HIGH (ref 4.0–10.5)
nRBC: 0 % (ref 0.0–0.2)

## 2021-08-21 LAB — BASIC METABOLIC PANEL
Anion gap: 14 (ref 5–15)
BUN: 12 mg/dL (ref 6–20)
CO2: 22 mmol/L (ref 22–32)
Calcium: 9.4 mg/dL (ref 8.9–10.3)
Chloride: 102 mmol/L (ref 98–111)
Creatinine, Ser: 0.75 mg/dL (ref 0.44–1.00)
GFR, Estimated: >60 mL/min (ref >60)
Glucose, Bld: 123 mg/dL — ABNORMAL HIGH (ref 70–99)
Potassium: 4.1 mmol/L (ref 3.5–5.1)
Sodium: 138 mmol/L (ref 135–145)

## 2021-08-21 LAB — TROPONIN I (HIGH SENSITIVITY): Troponin I (High Sensitivity): 8 ng/L (ref <18)

## 2021-08-21 LAB — D-DIMER, QUANTITATIVE: D-Dimer, Quant: 18.57 ug/mL-FEU — ABNORMAL HIGH (ref 0.00–0.50)

## 2021-08-21 MED ORDER — OXYCODONE-ACETAMINOPHEN 5-325 MG PO TABS
2.0000 | ORAL_TABLET | Freq: Once | ORAL | Status: AC
  Administered 2021-08-21: 2 via ORAL
  Filled 2021-08-21: qty 2

## 2021-08-21 NOTE — ED Triage Notes (Signed)
Patient reports left chest pain with SOB this evening , no emesis or diaphoresis . Denies fever or cough .

## 2021-08-21 NOTE — ED Provider Triage Note (Signed)
Emergency Medicine Provider Triage Evaluation Note  [name] , a 56 y.o. female  was evaluated in triage.  Pt complains of cp back pain. Report hx of metastatic cancer, not currently treated with chemo who report having pain in her chest and back throughout the day today with SOB.  No fever, n/v/d  Review of Systems  Positive: As above Negative: As above  Physical Exam  BP (!) 147/97   Pulse (!) 123   Temp 98.6 F (37 C) (Oral)   Resp 18   SpO2 95%  Gen:   Awake, no distress   Resp:  Normal effort  MSK:   Moves extremities without difficulty  Other:    Medical Decision Making  Medically screening exam initiated at 9:30 PM.  Appropriate orders placed.  [name] was informed that the remainder of the evaluation will be completed by another provider, this initial triage assessment does not replace that evaluation, and the importance of remaining in the ED until their evaluation is complete.     Domenic Moras, PA-C 08/21/21 2133

## 2021-08-22 ENCOUNTER — Emergency Department (HOSPITAL_COMMUNITY): Payer: BLUE CROSS/BLUE SHIELD

## 2021-08-22 ENCOUNTER — Other Ambulatory Visit: Payer: Self-pay | Admitting: Oncology

## 2021-08-22 ENCOUNTER — Inpatient Hospital Stay (HOSPITAL_COMMUNITY): Payer: BLUE CROSS/BLUE SHIELD

## 2021-08-22 ENCOUNTER — Encounter (HOSPITAL_COMMUNITY): Payer: Self-pay | Admitting: Internal Medicine

## 2021-08-22 ENCOUNTER — Other Ambulatory Visit (HOSPITAL_COMMUNITY): Payer: Self-pay

## 2021-08-22 DIAGNOSIS — Z90710 Acquired absence of both cervix and uterus: Secondary | ICD-10-CM | POA: Diagnosis not present

## 2021-08-22 DIAGNOSIS — I2694 Multiple subsegmental pulmonary emboli without acute cor pulmonale: Secondary | ICD-10-CM

## 2021-08-22 DIAGNOSIS — I2699 Other pulmonary embolism without acute cor pulmonale: Secondary | ICD-10-CM | POA: Diagnosis present

## 2021-08-22 DIAGNOSIS — M1990 Unspecified osteoarthritis, unspecified site: Secondary | ICD-10-CM | POA: Diagnosis present

## 2021-08-22 DIAGNOSIS — T40605A Adverse effect of unspecified narcotics, initial encounter: Secondary | ICD-10-CM | POA: Diagnosis present

## 2021-08-22 DIAGNOSIS — Z880 Allergy status to penicillin: Secondary | ICD-10-CM | POA: Diagnosis not present

## 2021-08-22 DIAGNOSIS — Z90722 Acquired absence of ovaries, bilateral: Secondary | ICD-10-CM | POA: Diagnosis not present

## 2021-08-22 DIAGNOSIS — Z91013 Allergy to seafood: Secondary | ICD-10-CM | POA: Diagnosis not present

## 2021-08-22 DIAGNOSIS — C7802 Secondary malignant neoplasm of left lung: Secondary | ICD-10-CM | POA: Diagnosis present

## 2021-08-22 DIAGNOSIS — Z79899 Other long term (current) drug therapy: Secondary | ICD-10-CM | POA: Diagnosis not present

## 2021-08-22 DIAGNOSIS — Z9049 Acquired absence of other specified parts of digestive tract: Secondary | ICD-10-CM | POA: Diagnosis not present

## 2021-08-22 DIAGNOSIS — C786 Secondary malignant neoplasm of retroperitoneum and peritoneum: Secondary | ICD-10-CM | POA: Diagnosis present

## 2021-08-22 DIAGNOSIS — C3480 Malignant neoplasm of overlapping sites of unspecified bronchus and lung: Secondary | ICD-10-CM

## 2021-08-22 DIAGNOSIS — K5903 Drug induced constipation: Secondary | ICD-10-CM | POA: Diagnosis present

## 2021-08-22 DIAGNOSIS — Z888 Allergy status to other drugs, medicaments and biological substances status: Secondary | ICD-10-CM | POA: Diagnosis not present

## 2021-08-22 DIAGNOSIS — Z9079 Acquired absence of other genital organ(s): Secondary | ICD-10-CM | POA: Diagnosis not present

## 2021-08-22 DIAGNOSIS — T402X5A Adverse effect of other opioids, initial encounter: Secondary | ICD-10-CM | POA: Diagnosis not present

## 2021-08-22 DIAGNOSIS — Z85118 Personal history of other malignant neoplasm of bronchus and lung: Secondary | ICD-10-CM | POA: Diagnosis not present

## 2021-08-22 DIAGNOSIS — C7801 Secondary malignant neoplasm of right lung: Secondary | ICD-10-CM | POA: Diagnosis present

## 2021-08-22 DIAGNOSIS — J9601 Acute respiratory failure with hypoxia: Secondary | ICD-10-CM | POA: Diagnosis present

## 2021-08-22 DIAGNOSIS — Z902 Acquired absence of lung [part of]: Secondary | ICD-10-CM | POA: Diagnosis not present

## 2021-08-22 DIAGNOSIS — Z881 Allergy status to other antibiotic agents status: Secondary | ICD-10-CM | POA: Diagnosis not present

## 2021-08-22 HISTORY — DX: Other pulmonary embolism without acute cor pulmonale: I26.99

## 2021-08-22 LAB — COMPREHENSIVE METABOLIC PANEL
ALT: 27 U/L (ref 0–44)
AST: 33 U/L (ref 15–41)
Albumin: 2.6 g/dL — ABNORMAL LOW (ref 3.5–5.0)
Alkaline Phosphatase: 219 U/L — ABNORMAL HIGH (ref 38–126)
Anion gap: 11 (ref 5–15)
BUN: 12 mg/dL (ref 6–20)
CO2: 22 mmol/L (ref 22–32)
Calcium: 9 mg/dL (ref 8.9–10.3)
Chloride: 103 mmol/L (ref 98–111)
Creatinine, Ser: 0.67 mg/dL (ref 0.44–1.00)
GFR, Estimated: >60 mL/min (ref >60)
Glucose, Bld: 100 mg/dL — ABNORMAL HIGH (ref 70–99)
Potassium: 4 mmol/L (ref 3.5–5.1)
Sodium: 136 mmol/L (ref 135–145)
Total Bilirubin: 0.4 mg/dL (ref 0.3–1.2)
Total Protein: 6.8 g/dL (ref 6.5–8.1)

## 2021-08-22 LAB — CBC WITH DIFFERENTIAL/PLATELET
Abs Immature Granulocytes: 0.17 10*3/uL — ABNORMAL HIGH (ref 0.00–0.07)
Basophils Absolute: 0.1 10*3/uL (ref 0.0–0.1)
Basophils Relative: 0 %
Eosinophils Absolute: 0.3 10*3/uL (ref 0.0–0.5)
Eosinophils Relative: 2 %
HCT: 29.8 % — ABNORMAL LOW (ref 36.0–46.0)
Hemoglobin: 9.2 g/dL — ABNORMAL LOW (ref 12.0–15.0)
Immature Granulocytes: 1 %
Lymphocytes Relative: 14 %
Lymphs Abs: 2.4 10*3/uL (ref 0.7–4.0)
MCH: 24.1 pg — ABNORMAL LOW (ref 26.0–34.0)
MCHC: 30.9 g/dL (ref 30.0–36.0)
MCV: 78.2 fL — ABNORMAL LOW (ref 80.0–100.0)
Monocytes Absolute: 2 10*3/uL — ABNORMAL HIGH (ref 0.1–1.0)
Monocytes Relative: 12 %
Neutro Abs: 12.4 10*3/uL — ABNORMAL HIGH (ref 1.7–7.7)
Neutrophils Relative %: 71 %
Platelets: 357 10*3/uL (ref 150–400)
RBC: 3.81 MIL/uL — ABNORMAL LOW (ref 3.87–5.11)
RDW: 14.5 % (ref 11.5–15.5)
WBC: 17.3 10*3/uL — ABNORMAL HIGH (ref 4.0–10.5)
nRBC: 0 % (ref 0.0–0.2)

## 2021-08-22 LAB — ECHOCARDIOGRAM COMPLETE
AR max vel: 3.04 cm2
AV Area VTI: 2.81 cm2
AV Area mean vel: 2.82 cm2
AV Mean grad: 5 mmHg
AV Peak grad: 8.6 mmHg
Ao pk vel: 1.47 m/s
Area-P 1/2: 9.14 cm2
S' Lateral: 2.2 cm
Weight: 2960 oz

## 2021-08-22 LAB — TROPONIN I (HIGH SENSITIVITY): Troponin I (High Sensitivity): 8 ng/L (ref <18)

## 2021-08-22 LAB — HEPARIN LEVEL (UNFRACTIONATED)
Heparin Unfractionated: 0.73 IU/mL — ABNORMAL HIGH (ref 0.30–0.70)
Heparin Unfractionated: 0.62 IU/mL (ref 0.30–0.70)

## 2021-08-22 LAB — BRAIN NATRIURETIC PEPTIDE: B Natriuretic Peptide: 18.2 pg/mL (ref 0.0–100.0)

## 2021-08-22 LAB — MAGNESIUM: Magnesium: 2 mg/dL (ref 1.7–2.4)

## 2021-08-22 MED ORDER — HEPARIN (PORCINE) 25000 UT/250ML-% IV SOLN
1400.0000 [IU]/h | INTRAVENOUS | Status: DC
  Administered 2021-08-22: 1500 [IU]/h via INTRAVENOUS
  Administered 2021-08-22: 1400 [IU]/h via INTRAVENOUS
  Administered 2021-08-22: 1500 [IU]/h via INTRAVENOUS
  Administered 2021-08-23 – 2021-08-24 (×3): 1400 [IU]/h via INTRAVENOUS
  Filled 2021-08-22 (×5): qty 250

## 2021-08-22 MED ORDER — MORPHINE SULFATE (PF) 4 MG/ML IV SOLN
4.0000 mg | INTRAVENOUS | Status: DC | PRN
  Administered 2021-08-22 (×2): 4 mg via INTRAVENOUS
  Filled 2021-08-22 (×2): qty 1

## 2021-08-22 MED ORDER — HYDROMORPHONE HCL 1 MG/ML IJ SOLN
0.5000 mg | INTRAMUSCULAR | Status: DC | PRN
  Administered 2021-08-22 – 2021-08-24 (×12): 0.5 mg via INTRAVENOUS
  Filled 2021-08-22 (×12): qty 0.5

## 2021-08-22 MED ORDER — HEPARIN BOLUS VIA INFUSION
4000.0000 [IU] | Freq: Once | INTRAVENOUS | Status: AC
  Administered 2021-08-22: 4000 [IU] via INTRAVENOUS
  Filled 2021-08-22: qty 4000

## 2021-08-22 MED ORDER — IOHEXOL 350 MG/ML SOLN
65.0000 mL | Freq: Once | INTRAVENOUS | Status: AC | PRN
  Administered 2021-08-22: 65 mL via INTRAVENOUS

## 2021-08-22 MED ORDER — MORPHINE SULFATE (PF) 4 MG/ML IV SOLN
4.0000 mg | Freq: Once | INTRAVENOUS | Status: AC
  Administered 2021-08-22: 4 mg via INTRAVENOUS
  Filled 2021-08-22: qty 1

## 2021-08-22 MED ORDER — ONDANSETRON HCL 4 MG/2ML IJ SOLN
4.0000 mg | Freq: Once | INTRAMUSCULAR | Status: AC
Start: 2021-08-22 — End: 2021-08-22
  Administered 2021-08-22: 4 mg via INTRAVENOUS
  Filled 2021-08-22: qty 2

## 2021-08-22 MED ORDER — ACETAMINOPHEN 325 MG PO TABS: 650.0000 mg | ORAL_TABLET | Freq: Four times a day (QID) | ORAL | Status: DC | PRN

## 2021-08-22 MED ORDER — ONDANSETRON HCL 4 MG/2ML IJ SOLN
4.0000 mg | Freq: Four times a day (QID) | INTRAMUSCULAR | Status: DC | PRN
  Administered 2021-08-22 – 2021-08-23 (×3): 4 mg via INTRAVENOUS
  Filled 2021-08-22 (×3): qty 2

## 2021-08-22 MED ORDER — NALOXONE HCL 0.4 MG/ML IJ SOLN: 0.4000 mg | INTRAMUSCULAR | Status: DC | PRN

## 2021-08-22 MED ORDER — ACETAMINOPHEN 650 MG RE SUPP: 650.0000 mg | Freq: Four times a day (QID) | RECTAL | Status: DC | PRN

## 2021-08-22 NOTE — H&P (Signed)
History and Physical    [name]:[id number] DOB: [birth year] DOA: 08/21/2021  PCP: Merryl Hacker, No   Chief Complaint: Chest pain  HPI: [name] is a 56 y.o. female with medical history significant of metastatic adenocarcinoma (presumably of the lung), who is being admitted for acute bilateral pulmonary emboli after presenting with new onset left-sided pleuritic chest pain associate with shortness of breath over the last few days.  Patient had been previously living in [place] with work-up there for abnormal CT scan showing a PET scan with metastatic disease, distant history of left upper lobectomy 2020 for presumed primary adenocarcinoma.  Liver biopsy at outpatient facility remarkable for adenocarcinoma.    Patient admitted with worsening chest pain, hypoxia and tachycardia in the setting of acute bilateral pulmonary emboli.  Initiated on heparin drip, discussed with oncology for close outpatient follow-up, once pain is well controlled and hypoxia has resolved or least improved patient will discharge home with very close outpatient follow-up for further discussion on treatment options as well as referral to palliative care given patient's metastatic disease a curative course is unlikely.    Review of Systems: As per HPI chest pain, pleuritic, dyspnea with exertion, palpitations and lumbago.    Assessment/Plan Principal Problem:   Acute pulmonary embolism (HCC) Active Problems:   Acute pulmonary embolism, unspecified pulmonary embolism type, unspecified whether acute cor pulmonale present (HCC)    Acute hypoxic respiratory failure in the setting of acute provoked PE, POA -Continue heparin drip, transition to DOAC at discharge -Provoked in the setting of adenocarcinoma, metastatic, discussed with oncology for outpatient follow-up as below -Continue pain control, titrate off IV narcotics as appropriate -Wean oxygen as tolerated, discussed with patient and family she may  discharge on oxygen pending further hypoxia screening  Metastatic adenocarcinoma Lung primary presumed -Discussed with oncology, close outpatient follow-up next week -Pathology in care everywhere at previous facility in [place] -We will likely follow-up with Dr. Earlie Server given adenocarcinoma of the lung, appears to be recurrent given negative imaging and surgery from 2020  DVT prophylaxis: Heparin drip Code Status: Full Family Communication: At bedside Status is: inpatient  Dispo: The patient is from: Home              Anticipated d/c is to: Same              Anticipated d/c date is: 24 to 48 hours              Patient currently not medically stable for discharge given uncontrolled pain on IV narcotics, IV anticoagulation in the setting of acute provoked PE  Consultants:  None  Procedures:  None   Past Medical History:  Diagnosis Date   Allergy    Arthritis    Complication of anesthesia    No pertinent past medical history    PONV (postoperative nausea and vomiting)    nausea with dizziness    Past Surgical History:  Procedure Laterality Date   ABDOMINAL HYSTERECTOMY     CHOLECYSTECTOMY     ENDOMETRIAL ABLATION     KNEE ARTHROSCOPY     LAPAROSCOPIC ASSISTED VAGINAL HYSTERECTOMY  02/13/2011   Procedure: LAPAROSCOPIC ASSISTED VAGINAL HYSTERECTOMY;  Surgeon: Olga Millers;  Location: [facility];  Service: Gynecology;  Laterality: N/A;   SALPINGOOPHORECTOMY  02/13/2011   Procedure: SALPINGO OOPHERECTOMY;  Surgeon: Olga Millers;  Location: [facility];  Service: Gynecology;  Laterality: Bilateral;   TONSILLECTOMY       reports that she has never  smoked. She does not have any smokeless tobacco history on file. She reports current alcohol use of about 2.0 standard drinks of alcohol per week. She reports that she does not use drugs.  Allergies  Allergen Reactions   Fish Allergy Hives   Penicillins Hives   Augmentin [Amoxicillin-Pot Clavulanate] Hives   Betadine [Povidone  Iodine] Hives    Bad hives per pt   Tetanus Toxoids Hives    Family History  Problem Relation Age of Onset   Cancer Mother    Heart disease Father    Cancer Brother    Mental illness Son    Cancer Paternal Aunt    Cancer Paternal Uncle    Cancer Maternal Aunt     Prior to Admission medications   Medication Sig Start Date End Date Taking? Authorizing Provider  diphenhydrAMINE-APAP, sleep, (TYLENOL PM EXTRA STRENGTH PO) Take by mouth.    [provider]  doxycycline (VIBRAMYCIN) 100 MG capsule Take 1 capsule (100 mg total) by mouth 2 (two) times daily. One po bid x 7 days 05/13/20   Mesner, Corene Cornea, MD  Multiple Vitamin (MULTIVITAMIN) tablet Take 1 tablet by mouth daily.    [provider]  TAGRISSO 80 MG tablet Take 80 mg by mouth daily. 05/04/20   [provider]    Physical Exam: Vitals:   08/22/21 0215 08/22/21 0330 08/22/21 0400 08/22/21 0736  BP: 135/90 116/79 138/81 120/80  Pulse: (!) 105 99 (!) 108 (!) 101  Resp: 15 14 16 17   Temp:  98.8 F (37.1 C) 97.8 F (36.6 C) 98.2 F (36.8 C)  TempSrc:  Oral Oral Oral  SpO2: 92% 93% 93% 90%  Weight:        Constitutional: NAD, calm, comfortable Vitals:   08/22/21 0215 08/22/21 0330 08/22/21 0400 08/22/21 0736  BP: 135/90 116/79 138/81 120/80  Pulse: (!) 105 99 (!) 108 (!) 101  Resp: 15 14 16 17   Temp:  98.8 F (37.1 C) 97.8 F (36.6 C) 98.2 F (36.8 C)  TempSrc:  Oral Oral Oral  SpO2: 92% 93% 93% 90%  Weight:       General:  Pleasantly resting in bed, No acute distress. HEENT:  Normocephalic atraumatic.  Sclerae nonicteric, noninjected.  Extraocular movements intact bilaterally. Neck:  Without mass or deformity.  Trachea is midline. Lungs:  Clear to auscultate bilaterally without rhonchi, wheeze, or rales. Heart:  Regular rate and rhythm.  Without murmurs, rubs, or gallops. Abdomen:  Soft, nontender, nondistended.  Without guarding or rebound. Extremities: Without cyanosis, clubbing,  edema, or obvious deformity. Vascular:  Dorsalis pedis and posterior tibial pulses palpable bilaterally. Skin:  Warm and dry, no erythema, no ulcerations.  Labs on Admission: I have personally reviewed following labs and imaging studies  CBC: Recent Labs  Lab 08/21/21 2137 08/22/21 0325  WBC 17.0* 17.3*  NEUTROABS  --  12.4*  HGB 10.5* 9.2*  HCT 34.0* 29.8*  MCV 77.4* 78.2*  PLT 384 657   Basic Metabolic Panel: Recent Labs  Lab 08/21/21 2137 08/22/21 0325  NA 138 136  K 4.1 4.0  CL 102 103  CO2 22 22  GLUCOSE 123* 100*  BUN 12 12  CREATININE 0.75 0.67  CALCIUM 9.4 9.0  MG  --  2.0   GFR: CrCl cannot be calculated (Unknown ideal weight.). Liver Function Tests: Recent Labs  Lab 08/22/21 0325  AST 33  ALT 27  ALKPHOS 219*  BILITOT 0.4  PROT 6.8  ALBUMIN 2.6*   No results  for input(s): "LIPASE", "AMYLASE" in the last 168 hours. No results for input(s): "AMMONIA" in the last 168 hours. Coagulation Profile: No results for input(s): "INR", "PROTIME" in the last 168 hours. Cardiac Enzymes: No results for input(s): "CKTOTAL", "CKMB", "CKMBINDEX", "TROPONINI" in the last 168 hours. BNP (last 3 results) No results for input(s): "PROBNP" in the last 8760 hours. HbA1C: No results for input(s): "HGBA1C" in the last 72 hours. CBG: No results for input(s): "GLUCAP" in the last 168 hours. Lipid Profile: No results for input(s): "CHOL", "HDL", "LDLCALC", "TRIG", "CHOLHDL", "LDLDIRECT" in the last 72 hours. Thyroid Function Tests: No results for input(s): "TSH", "T4TOTAL", "FREET4", "T3FREE", "THYROIDAB" in the last 72 hours. Anemia Panel: No results for input(s): "VITAMINB12", "FOLATE", "FERRITIN", "TIBC", "IRON", "RETICCTPCT" in the last 72 hours. Urine analysis:    Component Value Date/Time   COLORURINE YELLOW 02/14/2010 0600   APPEARANCEUR CLEAR 02/14/2010 0600   LABSPEC 1.028 02/14/2010 0600   PHURINE 5.5 02/14/2010 0600   GLUCOSEU NEGATIVE 02/14/2010 0600    HGBUR NEGATIVE 02/14/2010 0600   BILIRUBINUR LARGE (A) 02/14/2010 0600   KETONESUR NEGATIVE 02/14/2010 0600   PROTEINUR NEGATIVE 02/14/2010 0600   UROBILINOGEN 1.0 02/14/2010 0600   NITRITE NEGATIVE 02/14/2010 0600   LEUKOCYTESUR  02/14/2010 0600    NEGATIVE MICROSCOPIC NOT DONE ON URINES WITH NEGATIVE PROTEIN, BLOOD, LEUKOCYTES, NITRITE, OR GLUCOSE <1000 mg/dL.    Radiological Exams on Admission: CT Angio Chest PE W/Cm &/Or Wo Cm  Result Date: 08/22/2021 CLINICAL DATA:  Pulmonary embolus suspected. Positive D-dimer. Chest pain and shortness of breath. EXAM: CT ANGIOGRAPHY CHEST WITH CONTRAST TECHNIQUE: Multidetector CT imaging of the chest was performed using the standard protocol during bolus administration of intravenous contrast. Multiplanar CT image reconstructions and MIPs were obtained to evaluate the vascular anatomy. RADIATION DOSE REDUCTION: This exam was performed according to the departmental dose-optimization program which includes automated exposure control, adjustment of the mA and/or kV according to patient size and/or use of iterative reconstruction technique. CONTRAST:  29mL OMNIPAQUE IOHEXOL 350 MG/ML SOLN COMPARISON:  None Available. FINDINGS: Cardiovascular: Good opacification of the central and segmental pulmonary arteries. The examination is positive for multiple pulmonary emboli demonstrated in bilateral upper and lower lobe segmental and subsegmental branches. No large central pulmonary embolus. The RV to LV ratio is elevated at 1.1, however, there is evidence of left ventricular hypertrophy which may be responsible for this. Cardiac enlargement. Small pericardial effusion. Normal caliber thoracic aorta. No aortic dissection. Mediastinum/Nodes: There is a large right suprahilar mass extending into the mediastinum and likely involving right hilar and mediastinal lymph nodes as well. The mass surrounds the right mainstem bronchus and crosses the midline at the level of the carina.  Overall, the mass measures about 4.4 x 6.5 cm. The esophagus is decompressed. Lungs/Pleura: Small left pleural effusion with consolidation or atelectasis in the left lung base. Mild atelectasis in the right lung base. No pneumothorax. Upper Abdomen: Diffusely heterogeneous nodular appearance to the liver likely indicating diffuse liver metastasis. There is a mass in the tail of the pancreas measuring 3.3 cm diameter. Mass lesions are identified in the left inferior outer breast tissue, with largest measuring 2.7 cm in diameter. Musculoskeletal: There is an expansile mass lesion involving the anterior left fifth rib with soft tissue component measuring 3.7 x 5.7 cm. Lucent lesion with superior endplate compression fracture at T10 and additional smaller vertebral lucencies are likely metastatic. Review of the MIP images confirms the above findings. IMPRESSION: 1. Positive examination for bilateral segmental  and subsegmental pulmonary emboli. 2. Large right suprahilar mass extending contiguously into the mediastinum and measuring about 6.5 cm maximal diameter. Malignant lesions are also demonstrated throughout the liver, in the tail of the pancreas, in the left anterior fifth rib and multiple vertebral bodies as well as in the left breast tissue and pancreas. The primary lesion is uncertain and could represent the lung mass which is the largest, the breast lesions, or the pancreatic lesion. Tissue sampling is recommended. 3. Small left pleural effusion with consolidation or atelectasis in the left lung base. 4. Cardiac enlargement with left ventricular hypertrophy and small pericardial effusion. Critical Value/emergent results were called by telephone at the time of interpretation on 08/22/2021 at 1:19 am to provider [facility] , who verbally acknowledged these results. Electronically Signed   By: Lucienne Capers M.D.   On: 08/22/2021 01:29   DG Chest 1 View  Result Date: 08/21/2021 CLINICAL DATA:  Chest  pain, back pain EXAM: CHEST  1 VIEW COMPARISON:  05/13/2020 FINDINGS: Lungs volumes are small and there is left basilar atelectasis. No pneumothorax or pleural effusion. Cardiac size within normal limits. Pulmonary vascularity is normal. Osseous structures are age-appropriate. No acute bone abnormality. IMPRESSION: Pulmonary hypoinflation. Electronically Signed   By: Fidela Salisbury M.D.   On: 08/21/2021 21:57    EKG: Independently reviewed.   [name] DO [facility] For contact please use secure messenger on Epic  If 7PM-7AM, please contact night-coverage located on [email or web address]   08/22/2021, 7:48 AM

## 2021-08-22 NOTE — TOC Benefit Eligibility Note (Signed)
Patient Teacher, English as a foreign language completed.    The patient is currently admitted and upon discharge could be taking Eliquis 5 mg.  The current 30 day co-pay is, $40.00.   The patient is insured through [employer]   The patient is currently admitted and upon discharge could be taking Xarelto 20 mg.  The current 30 day co-pay is, $40.00.   Lyndel Safe, [place] Patient Advocate Specialist [place] Patient Advocate Team Direct Number: [phone]  Fax: [phone]

## 2021-08-22 NOTE — Progress Notes (Signed)
Mobility Specialist Progress Note   08/22/21 1545  Mobility  Activity Ambulated with assistance in hallway  Level of Assistance Minimal assist, patient does 75% or more  Assistive Device Front wheel walker  Distance Ambulated (ft) 38 ft  Activity Response Tolerated well  $Mobility charge 1 Mobility   Pre Mobility: 99% SpO2 During Mobility: 88% SpO2 Post Mobility: 93% SpO2  Patient received in bed having no complaints and agreeable to mobility. Requiring increased time to get supine > EOB > standing d/t targeted pain in back. Once standing, Ambulated in hall w/ [place], pt having multiple instances of buckling but no incidents observed. Returned to room w/ slight SOB but able to settle self w/ PLB  Was left in bed with all needs met, call bell in reach.   Holland Falling Mobility Specialist Phone Number [phone]

## 2021-08-22 NOTE — ED Notes (Signed)
ED TO INPATIENT HANDOFF REPORT  ED Nurse Name and Phone #: Mechele Claude, [phone]  S Name/Age/Gender [name] 56 y.o. female Room/Bed: 023C/023C  Code Status   Code Status: Full Code  Home/SNF/Other Home Patient oriented to: self, place, time, and situation Is this baseline? Yes   Triage Complete: Triage complete  Chief Complaint Acute pulmonary embolism ([place]) [I26.99]  Triage Note Patient reports left chest pain with SOB this evening , no emesis or diaphoresis . Denies fever or cough .    Allergies Allergies  Allergen Reactions   Fish Allergy Hives   Penicillins Hives   Augmentin [Amoxicillin-Pot Clavulanate] Hives   Betadine [Povidone Iodine] Hives    Bad hives per pt   Tetanus Toxoids Hives    Level of Care/Admitting Diagnosis ED Disposition     ED Disposition  Admit   Condition  --   Comment  Hospital Area: [facility] [[id number]]  Level of Care: Telemetry Medical [104]  May place patient in observation at [facility] or [facility] if equivalent level of care is available:: No  Covid Evaluation: Asymptomatic - no recent exposure (last 10 days) testing not required  Diagnosis: Acute pulmonary embolism [facility]) [[id number]]  Admitting Physician: Rhetta Mura [[id number]]  Attending Physician: Rhetta Mura [[id number]]          B Medical/Surgery History Past Medical History:  Diagnosis Date   Allergy    Arthritis    Complication of anesthesia    No pertinent past medical history    PONV (postoperative nausea and vomiting)    nausea with dizziness   Past Surgical History:  Procedure Laterality Date   ABDOMINAL HYSTERECTOMY     CHOLECYSTECTOMY     ENDOMETRIAL ABLATION     KNEE ARTHROSCOPY     LAPAROSCOPIC ASSISTED VAGINAL HYSTERECTOMY  02/13/2011   Procedure: LAPAROSCOPIC ASSISTED VAGINAL HYSTERECTOMY;  Surgeon: Olga Millers;  Location: [facility];  Service: Gynecology;  Laterality: N/A;   SALPINGOOPHORECTOMY  02/13/2011    Procedure: SALPINGO OOPHERECTOMY;  Surgeon: Olga Millers;  Location: [facility];  Service: Gynecology;  Laterality: Bilateral;   TONSILLECTOMY       A IV Location/Drains/Wounds Patient Lines/Drains/Airways Status     Active Line/Drains/Airways     Name Placement date Placement time Site Days   Peripheral IV 08/21/21 18 G Right Antecubital 08/21/21  2352  Antecubital  1   Peripheral IV 08/22/21 22 G Posterior;Right Wrist 08/22/21  0144  Wrist  less than 1            Intake/Output Last 24 hours No intake or output data in the 24 hours ending 08/22/21 0342  Labs/Imaging Results for orders placed or performed during the hospital encounter of 08/21/21 (from the past 48 hour(s))  Basic metabolic panel     Status: Abnormal   Collection Time: 08/21/21  9:37 PM  Result Value Ref Range   Sodium 138 135 - 145 mmol/L   Potassium 4.1 3.5 - 5.1 mmol/L   Chloride 102 98 - 111 mmol/L   CO2 22 22 - 32 mmol/L   Glucose, Bld 123 (H) 70 - 99 mg/dL    Comment: Glucose reference range applies only to samples taken after fasting for at least 8 hours.   BUN 12 6 - 20 mg/dL   Creatinine, Ser 0.75 0.44 - 1.00 mg/dL   Calcium 9.4 8.9 - 10.3 mg/dL   GFR, Estimated >60 >60 mL/min    Comment: (NOTE) Calculated using the  CKD-EPI Creatinine Equation (2021)    Anion gap 14 5 - 15    Comment: Performed at [facility], [place] [address] [place], [place] [address]  CBC     Status: Abnormal   Collection Time: 08/21/21  9:37 PM  Result Value Ref Range   WBC 17.0 (H) 4.0 - 10.5 K/uL   RBC 4.39 3.87 - 5.11 MIL/uL   Hemoglobin 10.5 (L) 12.0 - 15.0 g/dL   HCT 34.0 (L) 36.0 - 46.0 %   MCV 77.4 (L) 80.0 - 100.0 fL   MCH 23.9 (L) 26.0 - 34.0 pg   MCHC 30.9 30.0 - 36.0 g/dL   RDW 14.3 11.5 - 15.5 %   Platelets 384 150 - 400 K/uL   nRBC 0.0 0.0 - 0.2 %    Comment: Performed at [facility], [place] [address], [place], [place] [address]  Troponin I (High Sensitivity)     Status: None   Collection  Time: 08/21/21  9:37 PM  Result Value Ref Range   Troponin I (High Sensitivity) 8 <18 ng/L    Comment: (NOTE) Elevated high sensitivity troponin I (hsTnI) values and significant  changes across serial measurements may suggest ACS but many other  chronic and acute conditions are known to elevate hsTnI results.  Refer to the "Links" section for chest pain algorithms and additional  guidance. Performed at [facility], [place] [address] [place], [place] [address]   D-dimer, quantitative     Status: Abnormal   Collection Time: 08/21/21  9:37 PM  Result Value Ref Range   D-Dimer, Quant 18.57 (H) 0.00 - 0.50 ug/mL-FEU    Comment: (NOTE) At the manufacturer cut-off value of 0.5 g/mL FEU, this assay has a negative predictive value of 95-100%.This assay is intended for use in conjunction with a clinical pretest probability (PTP) assessment model to exclude pulmonary embolism (PE) and deep venous thrombosis (DVT) in outpatients suspected of PE or DVT. Results should be correlated with clinical presentation. Performed at [facility], [place] [address] [place], [place] [address]   Brain natriuretic peptide     Status: None   Collection Time: 08/21/21  9:37 PM  Result Value Ref Range   B Natriuretic Peptide 18.2 0.0 - 100.0 pg/mL    Comment: Performed at [facility] [address], [place], [place] [address]  Troponin I (High Sensitivity)     Status: None   Collection Time: 08/21/21 11:33 PM  Result Value Ref Range   Troponin I (High Sensitivity) 8 <18 ng/L    Comment: (NOTE) Elevated high sensitivity troponin I (hsTnI) values and significant  changes across serial measurements may suggest ACS but many other  chronic and acute conditions are known to elevate hsTnI results.  Refer to the "Links" section for chest pain algorithms and additional  guidance. Performed at [facility], [place] [address], [place], [place] [address]    CT Angio Chest PE W/Cm &/Or Wo  Cm  Result Date: 08/22/2021 CLINICAL DATA:  Pulmonary embolus suspected. Positive D-dimer. Chest pain and shortness of breath. EXAM: CT ANGIOGRAPHY CHEST WITH CONTRAST TECHNIQUE: Multidetector CT imaging of the chest was performed using the standard protocol during bolus administration of intravenous contrast. Multiplanar CT image reconstructions and MIPs were obtained to evaluate the vascular anatomy. RADIATION DOSE REDUCTION: This exam was performed according to the departmental dose-optimization program which includes automated exposure control, adjustment of the mA and/or kV according to patient size  and/or use of iterative reconstruction technique. CONTRAST:  28mL OMNIPAQUE IOHEXOL 350 MG/ML SOLN COMPARISON:  None Available. FINDINGS: Cardiovascular: Good opacification of the central and segmental pulmonary arteries. The examination is positive for multiple pulmonary emboli demonstrated in bilateral upper and lower lobe segmental and subsegmental branches. No large central pulmonary embolus. The RV to LV ratio is elevated at 1.1, however, there is evidence of left ventricular hypertrophy which may be responsible for this. Cardiac enlargement. Small pericardial effusion. Normal caliber thoracic aorta. No aortic dissection. Mediastinum/Nodes: There is a large right suprahilar mass extending into the mediastinum and likely involving right hilar and mediastinal lymph nodes as well. The mass surrounds the right mainstem bronchus and crosses the midline at the level of the carina. Overall, the mass measures about 4.4 x 6.5 cm. The esophagus is decompressed. Lungs/Pleura: Small left pleural effusion with consolidation or atelectasis in the left lung base. Mild atelectasis in the right lung base. No pneumothorax. Upper Abdomen: Diffusely heterogeneous nodular appearance to the liver likely indicating diffuse liver metastasis. There is a mass in the tail of the pancreas measuring 3.3 cm diameter. Mass lesions are  identified in the left inferior outer breast tissue, with largest measuring 2.7 cm in diameter. Musculoskeletal: There is an expansile mass lesion involving the anterior left fifth rib with soft tissue component measuring 3.7 x 5.7 cm. Lucent lesion with superior endplate compression fracture at T10 and additional smaller vertebral lucencies are likely metastatic. Review of the MIP images confirms the above findings. IMPRESSION: 1. Positive examination for bilateral segmental and subsegmental pulmonary emboli. 2. Large right suprahilar mass extending contiguously into the mediastinum and measuring about 6.5 cm maximal diameter. Malignant lesions are also demonstrated throughout the liver, in the tail of the pancreas, in the left anterior fifth rib and multiple vertebral bodies as well as in the left breast tissue and pancreas. The primary lesion is uncertain and could represent the lung mass which is the largest, the breast lesions, or the pancreatic lesion. Tissue sampling is recommended. 3. Small left pleural effusion with consolidation or atelectasis in the left lung base. 4. Cardiac enlargement with left ventricular hypertrophy and small pericardial effusion. Critical Value/emergent results were called by telephone at the time of interpretation on 08/22/2021 at 1:19 am to provider [facility] , who verbally acknowledged these results. Electronically Signed   By: Lucienne Capers M.D.   On: 08/22/2021 01:29   DG Chest 1 View  Result Date: 08/21/2021 CLINICAL DATA:  Chest pain, back pain EXAM: CHEST  1 VIEW COMPARISON:  05/13/2020 FINDINGS: Lungs volumes are small and there is left basilar atelectasis. No pneumothorax or pleural effusion. Cardiac size within normal limits. Pulmonary vascularity is normal. Osseous structures are age-appropriate. No acute bone abnormality. IMPRESSION: Pulmonary hypoinflation. Electronically Signed   By: Fidela Salisbury M.D.   On: 08/21/2021 21:57    Pending  Labs Unresulted Labs (From admission, onward)     Start     Ordered   08/23/21 0500  Heparin level (unfractionated)  Daily,   R      08/22/21 0153   08/23/21 0500  CBC  Daily,   R      08/22/21 0153   08/22/21 0800  Heparin level (unfractionated)  Once-Timed,   URGENT        08/22/21 0153   08/22/21 0500  CBC with Differential/Platelet  Tomorrow morning,   R        08/22/21 0311   08/22/21 0500  Comprehensive metabolic panel  Tomorrow morning,   R        08/22/21 0311   08/22/21 0500  Magnesium  Tomorrow morning,   R        08/22/21 0311            Vitals/Pain Today's Vitals   08/22/21 0148 08/22/21 0200 08/22/21 0215 08/22/21 0330  BP:  (!) 142/87 135/90 116/79  Pulse:  (!) 102 (!) 105 99  Resp:  19 15 14   Temp:    98.8 F (37.1 C)  TempSrc:    Oral  SpO2:  97% 92% 93%  Weight: 83.9 kg     PainSc:        Isolation Precautions No active isolations  Medications Medications  heparin ADULT infusion 100 units/mL (25000 units/245mL) (1,500 Units/hr Intravenous New Bag/Given 08/22/21 0203)  acetaminophen (TYLENOL) tablet 650 mg (has no administration in time range)    Or  acetaminophen (TYLENOL) suppository 650 mg (has no administration in time range)  naloxone (NARCAN) injection 0.4 mg (has no administration in time range)  morphine (PF) 4 MG/ML injection 4 mg (has no administration in time range)  ondansetron (ZOFRAN) injection 4 mg (has no administration in time range)  oxyCODONE-acetaminophen (PERCOCET/ROXICET) 5-325 MG per tablet 2 tablet (2 tablets Oral Given 08/21/21 2140)  morphine (PF) 4 MG/ML injection 4 mg (4 mg Intravenous Given 08/22/21 0019)  ondansetron (ZOFRAN) injection 4 mg (4 mg Intravenous Given 08/22/21 0019)  iohexol (OMNIPAQUE) 350 MG/ML injection 65 mL (65 mLs Intravenous Contrast Given 08/22/21 0104)  heparin bolus via infusion 4,000 Units (4,000 Units Intravenous Bolus from Bag 08/22/21 0200)    Mobility walks with person assist Low fall risk    Focused Assessments Neuro Assessment Handoff:  Swallow screen pass? Yes  Cardiac Rhythm: Normal sinus rhythm       Neuro Assessment: Within Defined Limits Neuro Checks:      Last Documented NIHSS Modified Score:   Has TPA been given? No If patient is a Neuro Trauma and patient is going to OR before floor call report to [place] nurse: [phone] or [phone]   R Recommendations: See Admitting Provider Note  Report given to:   Additional Notes: pt is AAOx4. Pt is on room air. Pt is ambulatory with assistance.

## 2021-08-22 NOTE — Progress Notes (Signed)
  Echocardiogram 2D Echocardiogram has been performed.  Joette Catching 08/22/2021, 9:53 AM

## 2021-08-22 NOTE — Progress Notes (Signed)
ANTICOAGULATION CONSULT NOTE - Initial Consult  Pharmacy Consult for Heparin Indication: pulmonary embolus  Allergies  Allergen Reactions   Fish Allergy Hives   Penicillins Hives   Augmentin [Amoxicillin-Pot Clavulanate] Hives   Betadine [Povidone Iodine] Hives    Bad hives per pt   Tetanus Toxoids Hives    Patient Measurements: Weight: 83.9 kg (185 lb)  Vital Signs: Temp: 98.6 F (37 C) (06/28 2124) Temp Source: Oral (06/28 2124) BP: 132/89 (06/29 0130) Pulse Rate: 102 (06/29 0130)  Labs: Recent Labs    08/21/21 2137 08/21/21 2333  HGB 10.5*  --   HCT 34.0*  --   PLT 384  --   CREATININE 0.75  --   TROPONINIHS 8 8    CrCl cannot be calculated (Unknown ideal weight.).   Medical History: Past Medical History:  Diagnosis Date   Allergy    Arthritis    Complication of anesthesia    No pertinent past medical history    PONV (postoperative nausea and vomiting)    nausea with dizziness    Medications:  No current facility-administered medications on file prior to encounter.   Current Outpatient Medications on File Prior to Encounter  Medication Sig Dispense Refill   diphenhydrAMINE-APAP, sleep, (TYLENOL PM EXTRA STRENGTH PO) Take by mouth.     doxycycline (VIBRAMYCIN) 100 MG capsule Take 1 capsule (100 mg total) by mouth 2 (two) times daily. One po bid x 7 days 14 capsule 0   Multiple Vitamin (MULTIVITAMIN) tablet Take 1 tablet by mouth daily.     TAGRISSO 80 MG tablet Take 80 mg by mouth daily.       Assessment: 56 y.o. female with bilateral PE for heparin Goal of Therapy:  Heparin level 0.3-0.7 units/ml Monitor platelets by anticoagulation protocol: Yes   Plan:  Heparin 4000 units IV bolus, then start heparin 1500 units/hr Check heparin level in 6 hours.   Melisha Eggleton, Bronson Curb 08/22/2021,1:50 AM

## 2021-08-22 NOTE — Progress Notes (Signed)
  Carryover admission to the Day Admitter.  I discussed this case with the EDP, Marko Plume, PA.  Per these discussions:   This is a 56 year old female with recently diagnosed metastatic cancer, who is being admitted for acute bilateral pulmonary emboli after presenting with new onset left-sided pleuritic chest pain associate with shortness of breath over the last few days.  Patient has recently moved to the [place] area from [place] to be closer to family the setting of new diagnosis of metastatic cancer. Patient with a history of lung cancer status post left upper lobectomy in 2020, followed by reportedly normal CT scan in December 2022.  However, recent PET scan showed widely metastatic disease, of unclear primary, with evidence of metastatic lesions in the lungs and abdomen.  She reportedly has had difficulty in establishing with oncology since arrival in [place].  CTA chest performed this evening in the ED reportedly shows bilateral segmental and subsegmental pulmonary emboli.  No evidence of associated hypotension. Started on Heparin drip via pharmacy consult.   I have placed an order for observation to med telemetry.   I have placed some additional preliminary admit orders via the adult multi-morbid admission order set. I have also ordered continuation of aforementioned heparin drip, as well as prn IV morphine in addition to echocardiogram for the morning.  Also placed order for routine morning labs.    [name], DO Hospitalist

## 2021-08-22 NOTE — ED Provider Notes (Signed)
[facility] EMERGENCY DEPARTMENT Provider Note   CSN: [id number] Arrival date & time: 08/21/21  2114     History  Chief Complaint  Patient presents with   Chest Pain    [name] is a 56 y.o. 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  She is not anticoagulated.  Patient primary oncology team has been in [place], however given new diagnosis recently patient relocated to [facility] a few days ago to live with her family during this time.  Has yet to establish with oncologist here in [place].  White count of 8.9, hemoglobin of 10.3, platelets of 287 and creatinine of 0.9 on 08/01/2021 in [place].  Patient has extensive mets on PET on 07/23/21. Per chart review plan from [facility] based oncologist was to proceed with CapeOx chemo.     HPI     Home Medications Prior to Admission medications   Medication Sig Start Date End Date Taking? Authorizing Provider  diphenhydrAMINE-APAP, sleep, (TYLENOL PM EXTRA STRENGTH PO) Take by mouth.    [provider]  doxycycline (VIBRAMYCIN) 100 MG capsule Take 1 capsule (100 mg total) by mouth 2 (two) times daily. One po bid x 7 days 05/13/20   Mesner, Corene Cornea, MD  Multiple Vitamin (MULTIVITAMIN) tablet Take 1 tablet by mouth daily.    [provider]  TAGRISSO 80 MG tablet Take 80 mg by mouth daily. 05/04/20   [provider]      Allergies    Fish allergy, Penicillins, Augmentin [amoxicillin-pot clavulanate], Betadine [povidone iodine], and Tetanus toxoids    Review of Systems   Review of  Systems  Constitutional:  Positive for fatigue. Negative for chills and fever.  HENT: Negative.    Respiratory:  Positive for shortness of breath. Negative for apnea, cough, choking and chest tightness.   Cardiovascular:  Positive for chest pain and leg swelling.  Gastrointestinal: Negative.   Genitourinary: Negative.   Musculoskeletal:  Positive for back pain and myalgias.  Neurological: Negative.   Hematological: Negative.     Physical Exam Updated Vital Signs BP 135/90   Pulse (!) 105   Temp 98.6 F (37 C) (Oral)   Resp 15   Wt 83.9 kg   SpO2 92%   BMI 27.32 kg/m  Physical Exam Vitals and nursing note reviewed.  Constitutional:      Appearance: She is not ill-appearing or toxic-appearing.  HENT:     Head: Normocephalic and atraumatic.     Nose: Nose normal.     Mouth/Throat:     Mouth: Mucous membranes are moist.     Pharynx: Oropharynx is clear. Uvula midline. No oropharyngeal exudate or posterior oropharyngeal erythema.     Tonsils: No tonsillar exudate.  Eyes:     General: Lids are normal. Vision grossly intact.        Right eye: No discharge.        Left eye: No discharge.     Extraocular Movements: Extraocular movements intact.     Conjunctiva/sclera: Conjunctivae normal.     Pupils: Pupils are equal, round, and reactive to light.  Neck:     Trachea:  Trachea and phonation normal.  Cardiovascular:     Rate and Rhythm: Regular rhythm. Tachycardia present.     Pulses: Normal pulses.     Heart sounds: Normal heart sounds. No murmur heard. Pulmonary:     Effort: Pulmonary effort is normal. Tachypnea present. No bradypnea, accessory muscle usage, prolonged expiration or respiratory distress.     Breath sounds: Normal breath sounds. No wheezing or rales.  Chest:     Chest wall: No mass, lacerations, deformity, swelling, tenderness, crepitus or edema.  Abdominal:     General: Bowel sounds are normal. There is no distension.     Palpations: Abdomen is soft.      Tenderness: There is no abdominal tenderness.  Musculoskeletal:        General: No deformity.     Cervical back: Normal range of motion and neck supple.     Right lower leg: 1+ Edema present.     Left lower leg: 1+ Edema present.     Comments: Spasm and TTP of the bilateral thoracic paraspinous musculature.   Lymphadenopathy:     Cervical: No cervical adenopathy.  Skin:    General: Skin is warm and dry.     Capillary Refill: Capillary refill takes less than 2 seconds.  Neurological:     General: No focal deficit present.     Mental Status: She is alert and oriented to person, place, and time. Mental status is at baseline.  Psychiatric:        Mood and Affect: Mood normal.     ED Results / Procedures / Treatments   Labs (all labs ordered are listed, but only abnormal results are displayed) Labs Reviewed  BASIC METABOLIC PANEL - Abnormal; Notable for the following components:      Result Value   Glucose, Bld 123 (*)    All other components within normal limits  CBC - Abnormal; Notable for the following components:   WBC 17.0 (*)    Hemoglobin 10.5 (*)    HCT 34.0 (*)    MCV 77.4 (*)    MCH 23.9 (*)    All other components within normal limits  D-DIMER, QUANTITATIVE - Abnormal; Notable for the following components:   D-Dimer, Quant 18.57 (*)    All other components within normal limits  BRAIN NATRIURETIC PEPTIDE  HEPARIN LEVEL (UNFRACTIONATED)  TROPONIN I (HIGH SENSITIVITY)  TROPONIN I (HIGH SENSITIVITY)    EKG EKG Interpretation  Date/Time:  Wednesday August 21 2021 21:23:58 EDT Ventricular Rate:  124 PR Interval:  142 QRS Duration: 72 QT Interval:  318 QTC Calculation: 456 R Axis:   30 Text Interpretation: Sinus tachycardia Otherwise normal ECG When compared with ECG of 14-Feb-2010 06:52, PREVIOUS ECG IS PRESENT Confirmed by Orpah Greek [phone]) on 08/21/2021 11:37:50 PM  Radiology CT Angio Chest PE W/Cm &/Or Wo Cm  Result Date: 08/22/2021 CLINICAL  DATA:  Pulmonary embolus suspected. Positive D-dimer. Chest pain and shortness of breath. EXAM: CT ANGIOGRAPHY CHEST WITH CONTRAST TECHNIQUE: Multidetector CT imaging of the chest was performed using the standard protocol during bolus administration of intravenous contrast. Multiplanar CT image reconstructions and MIPs were obtained to evaluate the vascular anatomy. RADIATION DOSE REDUCTION: This exam was performed according to the departmental dose-optimization program which includes automated exposure control, adjustment of the mA and/or kV according to patient size and/or use of iterative reconstruction technique. CONTRAST:  59mL OMNIPAQUE IOHEXOL 350 MG/ML SOLN COMPARISON:  None Available. FINDINGS: Cardiovascular: Good opacification of the central and segmental pulmonary  arteries. The examination is positive for multiple pulmonary emboli demonstrated in bilateral upper and lower lobe segmental and subsegmental branches. No large central pulmonary embolus. The RV to LV ratio is elevated at 1.1, however, there is evidence of left ventricular hypertrophy which may be responsible for this. Cardiac enlargement. Small pericardial effusion. Normal caliber thoracic aorta. No aortic dissection. Mediastinum/Nodes: There is a large right suprahilar mass extending into the mediastinum and likely involving right hilar and mediastinal lymph nodes as well. The mass surrounds the right mainstem bronchus and crosses the midline at the level of the carina. Overall, the mass measures about 4.4 x 6.5 cm. The esophagus is decompressed. Lungs/Pleura: Small left pleural effusion with consolidation or atelectasis in the left lung base. Mild atelectasis in the right lung base. No pneumothorax. Upper Abdomen: Diffusely heterogeneous nodular appearance to the liver likely indicating diffuse liver metastasis. There is a mass in the tail of the pancreas measuring 3.3 cm diameter. Mass lesions are identified in the left inferior outer  breast tissue, with largest measuring 2.7 cm in diameter. Musculoskeletal: There is an expansile mass lesion involving the anterior left fifth rib with soft tissue component measuring 3.7 x 5.7 cm. Lucent lesion with superior endplate compression fracture at T10 and additional smaller vertebral lucencies are likely metastatic. Review of the MIP images confirms the above findings. IMPRESSION: 1. Positive examination for bilateral segmental and subsegmental pulmonary emboli. 2. Large right suprahilar mass extending contiguously into the mediastinum and measuring about 6.5 cm maximal diameter. Malignant lesions are also demonstrated throughout the liver, in the tail of the pancreas, in the left anterior fifth rib and multiple vertebral bodies as well as in the left breast tissue and pancreas. The primary lesion is uncertain and could represent the lung mass which is the largest, the breast lesions, or the pancreatic lesion. Tissue sampling is recommended. 3. Small left pleural effusion with consolidation or atelectasis in the left lung base. 4. Cardiac enlargement with left ventricular hypertrophy and small pericardial effusion. Critical Value/emergent results were called by telephone at the time of interpretation on 08/22/2021 at 1:19 am to provider [facility] , who verbally acknowledged these results. Electronically Signed   By: Lucienne Capers M.D.   On: 08/22/2021 01:29   DG Chest 1 View  Result Date: 08/21/2021 CLINICAL DATA:  Chest pain, back pain EXAM: CHEST  1 VIEW COMPARISON:  05/13/2020 FINDINGS: Lungs volumes are small and there is left basilar atelectasis. No pneumothorax or pleural effusion. Cardiac size within normal limits. Pulmonary vascularity is normal. Osseous structures are age-appropriate. No acute bone abnormality. IMPRESSION: Pulmonary hypoinflation. Electronically Signed   By: Fidela Salisbury M.D.   On: 08/21/2021 21:57    Procedures .Critical Care  Performed by: Emeline Darling, PA-C Authorized by: Emeline Darling, PA-C   Critical care provider statement:    Critical care time (minutes):  45   Critical care was time spent personally by me on the following activities:  Development of treatment plan with patient or surrogate, discussions with consultants, evaluation of patient's response to treatment, examination of patient, obtaining history from patient or surrogate, ordering and performing treatments and interventions, ordering and review of laboratory studies, ordering and review of radiographic studies, pulse oximetry and re-evaluation of patient's condition     Medications Ordered in ED Medications  heparin ADULT infusion 100 units/mL (25000 units/240mL) (1,500 Units/hr Intravenous New Bag/Given 08/22/21 0203)  oxyCODONE-acetaminophen (PERCOCET/ROXICET) 5-325 MG per tablet 2 tablet (2 tablets Oral Given 08/21/21 2140)  morphine (PF) 4 MG/ML injection 4 mg (4 mg Intravenous Given 08/22/21 0019)  ondansetron (ZOFRAN) injection 4 mg (4 mg Intravenous Given 08/22/21 0019)  iohexol (OMNIPAQUE) 350 MG/ML injection 65 mL (65 mLs Intravenous Contrast Given 08/22/21 0104)  heparin bolus via infusion 4,000 Units (4,000 Units Intravenous Bolus from Bag 08/22/21 0200)    ED Course/ Medical Decision Making/ A&P Clinical Course as of 08/22/21 0313  Thu Aug 22, 2021  0211 Consult to family medicine Dr. Adah Salvage who says that their service is capped will have psychiatry reconsult for unassigned medical admission. [RS]  0306 Consult to Dr. Velia Meyer, who is agreeable to seeing this patient and admitting her to his service. I appreciate his collaboration in the care of this patient.  [RS]    Clinical Course User Index [RS] Zacharius Funari, Gypsy Balsam, PA-C                           Medical Decision Making 56 year old female who presents with concern for CP, SOB, leg swelling, and back pain x a few days, worsening tonight.   Tachycardic on intake, hypertensive vital signs  otherwise normal.  Intermittently tachypneic as well.  Cardiac exam with tachycardia with regular rhythm pulmonary exam is unremarkable.  Abdominal exam is benign.  1+ lower extremity edema bilaterally without pitting or concern for asymmetric swelling.  Patient's comorbidity of widely metastatic cancer of unknown primary complicated her work-up today   Differential diagnosis includes but limited to ACS, PE, pleural effusion, pneumonia, pulmonary mass causing obstruction, dysrhythmia, CHF.  Amount and/or Complexity of Data Reviewed Labs: ordered.    Details: CBC with leukocytosis 17,000, hemoglobin 10.5 near patient's baseline.  BMP unremarkable with normal creatinine and electrolytes.  D-dimer significant elevated to 18.5, normal troponin at 8x2. Radiology: ordered.    Details: Chest x-ray negative for acute cardiopulmonary disease but with pulmonary hypoinflation.  CT PE study ordered given exquisitely elevated D-dimer in context of malignancy and cardiopulmonary symptoms today. CT PE study revealed multiple bilateral segmental and subsegmental PEs without right heart strain.  Additionally there is extensive malignancy throughout the chest and abdomen.  Small pericardial effusion.  Risk Prescription drug management. Decision regarding hospitalization.    Patient will require admission to the hospital for further stabilization in context of a widely spread malignancy was recently diagnosed as well as new PEs today.  Heparin started in the emergency department.  Consult for unassigned medical admission pending at this time. Consult to Dr. Velia Meyer, hospitalist as above.   Summers and her sister  voiced understanding of her medical evaluation and treatment plan. Each of their questions answered to their expressed satisfaction.  She is amenable to plan for admission at this time.   This chart was dictated using voice recognition software, Dragon. Despite the best efforts of this provider to  proofread and correct errors, errors may still occur which can change documentation meaning.  Final Clinical Impression(s) / ED Diagnoses Final diagnoses:  Multiple subsegmental pulmonary emboli without acute cor pulmonale (HCC)  Multiple lesions of metastatic malignancy [facility])    Rx / DC Orders ED Discharge Orders     None         Emeline Darling, PA-C 08/22/21 0314    Orpah Greek, MD 08/22/21 [phone]

## 2021-08-22 NOTE — Progress Notes (Signed)
Person for Heparin Indication: pulmonary embolus  Allergies  Allergen Reactions   Fish Allergy Hives   Penicillins Hives   Augmentin [Amoxicillin-Pot Clavulanate] Hives   Betadine [Povidone Iodine] Hives    Bad hives per pt   Tetanus Toxoids Hives    Patient Measurements: Weight: 83.9 kg (185 lb)  Heparin Dosing Weight: 84 kg  Vital Signs: Temp: 97.9 F (36.6 C) (06/29 1127) Temp Source: Oral (06/29 1127) BP: 123/89 (06/29 1127) Pulse Rate: 97 (06/29 1127)  Labs: Recent Labs    08/21/21 2137 08/21/21 2333 08/22/21 0325 08/22/21 0806  HGB 10.5*  --  9.2*  --   HCT 34.0*  --  29.8*  --   PLT 384  --  357  --   HEPARINUNFRC  --   --   --  0.73*  CREATININE 0.75  --  0.67  --   TROPONINIHS 8 8  --   --     CrCl cannot be calculated (Unknown ideal weight.).   Assessment: 56 YO F with medical history of recently diagnosed metastatic cancer who presented with new SOB found to have bilateral pulmonary emboli. No anticoagulation PTA. Pharmacy consulted to manage heparin.  Hgb with small drop 10.5 > 9.2, platelets 357. No issues with heparin infusion or signs of bleeding reported. Heparin level above goal at 0.73 on heparin 1500 units/hr.    Goal of Therapy:  Heparin level 0.3-0.7 units/ml Monitor platelets by anticoagulation protocol: Yes   Plan:  Decrease heparin infusion to 1400 units/hr Check heparin level in 6 hours and daily while on heparin Continue to monitor H&H and platelets    Thank you for allowing pharmacy to be a part of this patient's care.  Ardyth Harps, PharmD Clinical Pharmacist

## 2021-08-22 NOTE — Progress Notes (Signed)
ANTICOAGULATION CONSULT NOTE  Pharmacy Consult for Heparin Indication: pulmonary embolus  Allergies  Allergen Reactions   Fish Allergy Hives   Penicillins Hives   Augmentin [Amoxicillin-Pot Clavulanate] Hives   Betadine [Povidone Iodine] Hives    Bad hives per pt   Tetanus Toxoids Hives    Patient Measurements: Height: 5\' 9"  (175.3 cm) Weight: 83.9 kg (185 lb) IBW/kg (Calculated) : 66.2  Heparin Dosing Weight: 84 kg  Vital Signs: Temp: 98.2 F (36.8 C) (06/29 1954) Temp Source: Oral (06/29 1954) BP: 121/79 (06/29 1954) Pulse Rate: 106 (06/29 1954)  Labs: Recent Labs    08/21/21 2137 08/21/21 2333 08/22/21 0325 08/22/21 0806 08/22/21 1956  HGB 10.5*  --  9.2*  --   --   HCT 34.0*  --  29.8*  --   --   PLT 384  --  357  --   --   HEPARINUNFRC  --   --   --  0.73* 0.62  CREATININE 0.75  --  0.67  --   --   TROPONINIHS 8 8  --   --   --      Estimated Creatinine Clearance: 88.7 mL/min (by C-G formula based on SCr of 0.67 mg/dL).   Assessment: 56 YO F with medical history of recently diagnosed metastatic cancer who presented with new SOB found to have bilateral pulmonary emboli. No anticoagulation PTA. Pharmacy consulted to manage heparin.  Hgb with small drop 10.5 > 9.2, platelets 357. No issues with heparin infusion or signs of bleeding reported. Heparin level above goal at 0.73 on heparin 1500 units/hr.   Heparin level therapeutic tonight. Cont same rate and check confirm level in AM.   Goal of Therapy:  Heparin level 0.3-0.7 units/ml Monitor platelets by anticoagulation protocol: Yes   Plan:  Cont heparin infusion 1400 units/hr Check heparin level daily while on heparin Continue to monitor H&H and platelets    Thank you for allowing pharmacy to be a part of this patient's care.  Ardyth Harps, PharmD Clinical Pharmacist

## 2021-08-23 DIAGNOSIS — I2699 Other pulmonary embolism without acute cor pulmonale: Secondary | ICD-10-CM | POA: Diagnosis not present

## 2021-08-23 LAB — CBC
HCT: 30.6 % — ABNORMAL LOW (ref 36.0–46.0)
Hemoglobin: 9.6 g/dL — ABNORMAL LOW (ref 12.0–15.0)
MCH: 24.4 pg — ABNORMAL LOW (ref 26.0–34.0)
MCHC: 31.4 g/dL (ref 30.0–36.0)
MCV: 77.7 fL — ABNORMAL LOW (ref 80.0–100.0)
Platelets: 366 10*3/uL (ref 150–400)
RBC: 3.94 MIL/uL (ref 3.87–5.11)
RDW: 14.5 % (ref 11.5–15.5)
WBC: 16.8 10*3/uL — ABNORMAL HIGH (ref 4.0–10.5)
nRBC: 0 % (ref 0.0–0.2)

## 2021-08-23 LAB — HEPARIN LEVEL (UNFRACTIONATED): Heparin Unfractionated: 0.64 IU/mL (ref 0.30–0.70)

## 2021-08-23 MED ORDER — HYDROMORPHONE HCL 1 MG/ML IJ SOLN
1.0000 mg | Freq: Once | INTRAMUSCULAR | Status: AC
  Administered 2021-08-23: 1 mg via INTRAVENOUS
  Filled 2021-08-23: qty 1

## 2021-08-23 MED ORDER — OXYCODONE HCL ER 10 MG PO T12A
10.0000 mg | EXTENDED_RELEASE_TABLET | Freq: Two times a day (BID) | ORAL | Status: DC
  Administered 2021-08-23 – 2021-08-24 (×3): 10 mg via ORAL
  Filled 2021-08-23 (×2): qty 1

## 2021-08-23 MED ORDER — POLYETHYLENE GLYCOL 3350 17 G PO PACK
17.0000 g | PACK | Freq: Every day | ORAL | Status: DC | PRN
Start: 2021-08-23 — End: 2021-08-24
  Administered 2021-08-23: 17 g via ORAL
  Filled 2021-08-23: qty 1

## 2021-08-23 MED ORDER — OXYCODONE-ACETAMINOPHEN 5-325 MG PO TABS
1.0000 | ORAL_TABLET | ORAL | Status: DC | PRN
  Administered 2021-08-23 – 2021-08-26 (×13): 2 via ORAL
  Filled 2021-08-23 (×14): qty 2

## 2021-08-23 MED ORDER — SENNOSIDES-DOCUSATE SODIUM 8.6-50 MG PO TABS
1.0000 | ORAL_TABLET | Freq: Every evening | ORAL | Status: DC | PRN
  Administered 2021-08-23: 1 via ORAL
  Filled 2021-08-23: qty 1

## 2021-08-23 NOTE — Plan of Care (Signed)

## 2021-08-23 NOTE — Progress Notes (Signed)
Mobility Specialist Progress Note   08/23/21 1606  Mobility  Activity Ambulated with assistance in hallway  Level of Assistance Contact guard assist, steadying assist  Assistive Device Front wheel walker  Distance Ambulated (ft) 120 ft  Activity Response Tolerated well  $Mobility charge 1 Mobility   Pre Mobility: 87 HR, 94% SpO2 on RA During Mobility: 112 HR, 85% SpO2 on RA  Post Mobility: 88 HR, 98% SpO2 on 2LO2  Patient received in chair c/o "wooziness" from medicine but agreeable to participate in mobility. Ambulated in hallway w/ chair follow to progress distance. Pt requiring increase time d/t LE weakness + min cues to widen base in gait. X1 seated break d/t minimal chest tightness, SpO2 desat to 85% - 86% requiring 2LO2 to get pt to remain >92% SpO2, per advisement of RN. Pt requesting to be rolled back to room after break, no incident recorded during session. Was left back in chair with all needs met and call bell in reach.   Holland Falling Mobility Specialist MS [facility] #:  [phone] Acute Rehab Office:  [phone]

## 2021-08-23 NOTE — Progress Notes (Signed)
PROGRESS NOTE    [name]  MVH:[id number] DOB: 10/04/1965 DOA: 08/21/2021 PCP: Merryl Hacker, No   Brief Narrative:  [name] is a 56 y.o. female with medical history significant of metastatic adenocarcinoma (presumably of the lung), who is being admitted for acute bilateral pulmonary emboli after presenting with new onset left-sided pleuritic chest pain associate with shortness of breath over the last few days.  Patient had been previously living in [place] with work-up there for abnormal CT scan showing a PET scan with metastatic disease, distant history of left upper lobectomy 2020 for presumed primary adenocarcinoma.  Liver biopsy at outpatient facility remarkable for adenocarcinoma.     Patient admitted with worsening chest pain, hypoxia and tachycardia in the setting of acute bilateral pulmonary emboli.  Initiated on heparin drip, discussed with oncology for close outpatient follow-up, once pain is well controlled and hypoxia has resolved or least improved patient will discharge home with very close outpatient follow-up for further discussion on treatment options as well as referral to palliative care given patient's metastatic disease a curative course is unlikely.     Assessment & Plan:   Principal Problem:   Acute pulmonary embolism (HCC) Active Problems:   Acute pulmonary embolism, unspecified pulmonary embolism type, unspecified whether acute cor pulmonale present (HCC)  Acute hypoxic respiratory failure in the setting of acute provoked PE, POA -Continue heparin drip, transition to DOAC at discharge -Provoked in the setting of adenocarcinoma, metastatic, discussed with oncology for outpatient follow-up as below -Continue pain control, titrate off IV narcotics as appropriate -Wean oxygen as tolerated, discussed with patient and family she may discharge on oxygen pending further hypoxia screening   Metastatic adenocarcinoma Lung primary presumed -Discussed with  oncology, close outpatient follow-up next week -Pathology in care everywhere at previous facility in [place] -We will likely follow-up with Dr. Earlie Server given adenocarcinoma of the lung, appears to be recurrent given negative imaging and surgery from 2020  Acute intractable pain -Multifactorial in the setting of above -Concern for metastatic disease to bone/spine given location of pain -Wean off IV dilaudid - increase PO oxycodone PRN and oxycontin scheduled doses -Discussed briefly with oncology - will attempt to get PET scan results from [place] - may have more advanced disease than previously noted. Possible transition to PCA pump in 24 hours if pain stil poorly controlled  DVT prophylaxis: heparin gtt Code Status: Full Family Communication: At bedside  Status is: Inpt  Dispo: The patient is from: Home              Anticipated d/c is to: Home              Anticipated d/c date is: TBD (24-48h)              Patient currently NOT medically stable for discharge  Consultants:  None  Procedures:  None  Antimicrobials:  None   Subjective: No acute issues/events overnight  Objective: Vitals:   08/22/21 2337 08/23/21 0426 08/23/21 0749 08/23/21 1514  BP: 114/84 110/72 121/78 116/71  Pulse: 94 89 92 99  Resp: 18 18    Temp: 98.3 F (36.8 C) 98.4 F (36.9 C) 98.5 F (36.9 C) 98.3 F (36.8 C)  TempSrc: Oral Oral Oral Oral  SpO2: 96% 96% 97% 90%  Weight:      Height:        Intake/Output Summary (Last 24 hours) at 08/23/2021 1931 Last data filed at 08/23/2021 1300 Gross per 24 hour  Intake 638.9 ml  Output 2 ml  Net 636.9 ml   Filed Weights   08/22/21 0148  Weight: 83.9 kg    Examination:  General exam: Appears calm and comfortable  Respiratory system: Clear to auscultation. Respiratory effort normal. Cardiovascular system: S1 & S2 heard, RRR. No JVD, murmurs, rubs, gallops or clicks. No pedal edema. Gastrointestinal system: Abdomen is nondistended, soft and  nontender. No organomegaly or masses felt. Normal bowel sounds heard. Central nervous system: Alert and oriented. No focal neurological deficits. Extremities: Symmetric 5 x 5 power. Skin: No rashes, lesions or ulcers Psychiatry: Judgement and insight appear normal. Mood & affect appropriate.     Data Reviewed: I have personally reviewed following labs and imaging studies  CBC: Recent Labs  Lab 08/21/21 2137 08/22/21 0325 08/23/21 0149  WBC 17.0* 17.3* 16.8*  NEUTROABS  --  12.4*  --   HGB 10.5* 9.2* 9.6*  HCT 34.0* 29.8* 30.6*  MCV 77.4* 78.2* 77.7*  PLT 384 357 976   Basic Metabolic Panel: Recent Labs  Lab 08/21/21 2137 08/22/21 0325  NA 138 136  K 4.1 4.0  CL 102 103  CO2 22 22  GLUCOSE 123* 100*  BUN 12 12  CREATININE 0.75 0.67  CALCIUM 9.4 9.0  MG  --  2.0   GFR: Estimated Creatinine Clearance: 88.7 mL/min (by C-G formula based on SCr of 0.67 mg/dL). Liver Function Tests: Recent Labs  Lab 08/22/21 0325  AST 33  ALT 27  ALKPHOS 219*  BILITOT 0.4  PROT 6.8  ALBUMIN 2.6*   No results for input(s): "LIPASE", "AMYLASE" in the last 168 hours. No results for input(s): "AMMONIA" in the last 168 hours. Coagulation Profile: No results for input(s): "INR", "PROTIME" in the last 168 hours. Cardiac Enzymes: No results for input(s): "CKTOTAL", "CKMB", "CKMBINDEX", "TROPONINI" in the last 168 hours. BNP (last 3 results) No results for input(s): "PROBNP" in the last 8760 hours. HbA1C: No results for input(s): "HGBA1C" in the last 72 hours. CBG: No results for input(s): "GLUCAP" in the last 168 hours. Lipid Profile: No results for input(s): "CHOL", "HDL", "LDLCALC", "TRIG", "CHOLHDL", "LDLDIRECT" in the last 72 hours. Thyroid Function Tests: No results for input(s): "TSH", "T4TOTAL", "FREET4", "T3FREE", "THYROIDAB" in the last 72 hours. Anemia Panel: No results for input(s): "VITAMINB12", "FOLATE", "FERRITIN", "TIBC", "IRON", "RETICCTPCT" in the last 72  hours. Sepsis Labs: No results for input(s): "PROCALCITON", "LATICACIDVEN" in the last 168 hours.  No results found for this or any previous visit (from the past 240 hour(s)).       Radiology Studies: ECHOCARDIOGRAM COMPLETE  Result Date: 08/22/2021    ECHOCARDIOGRAM REPORT   Patient Name:   RUVI FULLENWIDER [facility] Date of Exam: 08/22/2021 Medical Rec #:  [id number]        Height:       69.0 in Accession #:    [id number]       Weight:       185.0 lb Date of Birth:  11-20-1965         BSA:          1.999 m Patient Age:    20 years         BP:           120/80 mmHg Patient Gender: F                HR:           100 bpm. Exam Location:  Inpatient Procedure: 2D Echo, Cardiac Doppler and Color Doppler Indications:  Pulmonary embolism  History:        Patient has no prior history of Echocardiogram examinations.                 Signs/Symptoms:Chest Pain and Shortness of Breath.  Sonographer:    Joette Catching RCS Referring Phys: [id number] [place]  1. Left ventricular ejection fraction, by estimation, is 60 to 65%. The left ventricle has normal function. The left ventricle has no regional wall motion abnormalities. Left ventricular diastolic parameters are consistent with Grade I diastolic dysfunction (impaired relaxation).  2. Right ventricular systolic function is normal. The right ventricular size is normal. There is moderately elevated pulmonary artery systolic pressure. The estimated right ventricular systolic pressure is 18.2 mmHg.  3. The mitral valve is normal in structure. No evidence of mitral valve regurgitation. No evidence of mitral stenosis.  4. The aortic valve is normal in structure. Aortic valve regurgitation is not visualized. No aortic stenosis is present.  5. The inferior vena cava is normal in size with <50% respiratory variability, suggesting right atrial pressure of 8 mmHg.  6. There is a small to moderate circumferential pericardial effusion present. The pericardial effusion  measures 1.45cm at greatest diameter posteriorly.     There is no evidence of cardiac tamponade. FINDINGS  Left Ventricle: Left ventricular ejection fraction, by estimation, is 60 to 65%. The left ventricle has normal function. The left ventricle has no regional wall motion abnormalities. The left ventricular internal cavity size was normal in size. There is  no left ventricular hypertrophy. Left ventricular diastolic parameters are consistent with Grade I diastolic dysfunction (impaired relaxation). Normal left ventricular filling pressure. Right Ventricle: The right ventricular size is normal. No increase in right ventricular wall thickness. Right ventricular systolic function is normal. There is moderately elevated pulmonary artery systolic pressure. The tricuspid regurgitant velocity is 3.45 m/s, and with an assumed right atrial pressure of 8 mmHg, the estimated right ventricular systolic pressure is 99.3 mmHg. Left Atrium: Left atrial size was normal in size. Right Atrium: Right atrial size was normal in size. Pericardium: The pericardial effusion measures 1.45cm at greatest diameter posteriorly. A small pericardial effusion is present. The pericardial effusion is circumferential. There is no evidence of cardiac tamponade. Mitral Valve: The mitral valve is normal in structure. No evidence of mitral valve regurgitation. No evidence of mitral valve stenosis. Tricuspid Valve: The tricuspid valve is normal in structure. Tricuspid valve regurgitation is mild . No evidence of tricuspid stenosis. Aortic Valve: The aortic valve is normal in structure. Aortic valve regurgitation is not visualized. No aortic stenosis is present. Aortic valve mean gradient measures 5.0 mmHg. Aortic valve peak gradient measures 8.6 mmHg. Aortic valve area, by VTI measures 2.81 cm. Pulmonic Valve: The pulmonic valve was normal in structure. Pulmonic valve regurgitation is not visualized. No evidence of pulmonic stenosis. Aorta: The aortic  root is normal in size and structure. Venous: The inferior vena cava is normal in size with less than 50% respiratory variability, suggesting right atrial pressure of 8 mmHg. IAS/Shunts: No atrial level shunt detected by color flow Doppler.  LEFT VENTRICLE PLAX 2D LVIDd:         3.50 cm   Diastology LVIDs:         2.20 cm   LV e' medial:    7.18 cm/s LV PW:         1.00 cm   LV E/e' medial:  9.2 LV IVS:  0.90 cm   LV e' lateral:   10.60 cm/s LVOT diam:     2.10 cm   LV E/e' lateral: 6.2 LV SV:         73 LV SV Index:   37 LVOT Area:     3.46 cm  RIGHT VENTRICLE             IVC RV Basal diam:  3.10 cm     IVC diam: 1.70 cm RV Mid diam:    2.30 cm RV S prime:     14.20 cm/s TAPSE (M-mode): 2.0 cm LEFT ATRIUM             Index        RIGHT ATRIUM           Index LA diam:        2.70 cm 1.35 cm/m   RA Area:     10.70 cm LA Vol (A2C):   29.2 ml 14.61 ml/m  RA Volume:   23.00 ml  11.51 ml/m LA Vol (A4C):   22.8 ml 11.41 ml/m LA Biplane Vol: 27.1 ml 13.56 ml/m  AORTIC VALVE AV Area (Vmax):    3.04 cm AV Area (Vmean):   2.82 cm AV Area (VTI):     2.81 cm AV Vmax:           147.00 cm/s AV Vmean:          110.000 cm/s AV VTI:            0.261 m AV Peak Grad:      8.6 mmHg AV Mean Grad:      5.0 mmHg LVOT Vmax:         129.00 cm/s LVOT Vmean:        89.600 cm/s LVOT VTI:          0.212 m LVOT/AV VTI ratio: 0.81  AORTA Ao Root diam: 3.10 cm Ao Asc diam:  2.70 cm MITRAL VALVE               TRICUSPID VALVE MV Area (PHT): 9.14 cm    TR Peak grad:   47.6 mmHg MV Decel Time: 83 msec     TR Vmax:        345.00 cm/s MV E velocity: 66.00 cm/s MV A velocity: 91.30 cm/s  SHUNTS MV E/A ratio:  0.72        Systemic VTI:  0.21 m                            Systemic Diam: 2.10 cm Fransico Him MD Electronically signed by Fransico Him MD Signature Date/Time: 08/22/2021/10:10:00 AM    Final    CT Angio Chest PE W/Cm &/Or Wo Cm  Result Date: 08/22/2021 CLINICAL DATA:  Pulmonary embolus suspected. Positive D-dimer. Chest pain  and shortness of breath. EXAM: CT ANGIOGRAPHY CHEST WITH CONTRAST TECHNIQUE: Multidetector CT imaging of the chest was performed using the standard protocol during bolus administration of intravenous contrast. Multiplanar CT image reconstructions and MIPs were obtained to evaluate the vascular anatomy. RADIATION DOSE REDUCTION: This exam was performed according to the departmental dose-optimization program which includes automated exposure control, adjustment of the mA and/or kV according to patient size and/or use of iterative reconstruction technique. CONTRAST:  57mL OMNIPAQUE IOHEXOL 350 MG/ML SOLN COMPARISON:  None Available. FINDINGS: Cardiovascular: Good opacification of the central and segmental pulmonary arteries. The examination is positive for multiple pulmonary emboli demonstrated  in bilateral upper and lower lobe segmental and subsegmental branches. No large central pulmonary embolus. The RV to LV ratio is elevated at 1.1, however, there is evidence of left ventricular hypertrophy which may be responsible for this. Cardiac enlargement. Small pericardial effusion. Normal caliber thoracic aorta. No aortic dissection. Mediastinum/Nodes: There is a large right suprahilar mass extending into the mediastinum and likely involving right hilar and mediastinal lymph nodes as well. The mass surrounds the right mainstem bronchus and crosses the midline at the level of the carina. Overall, the mass measures about 4.4 x 6.5 cm. The esophagus is decompressed. Lungs/Pleura: Small left pleural effusion with consolidation or atelectasis in the left lung base. Mild atelectasis in the right lung base. No pneumothorax. Upper Abdomen: Diffusely heterogeneous nodular appearance to the liver likely indicating diffuse liver metastasis. There is a mass in the tail of the pancreas measuring 3.3 cm diameter. Mass lesions are identified in the left inferior outer breast tissue, with largest measuring 2.7 cm in diameter.  Musculoskeletal: There is an expansile mass lesion involving the anterior left fifth rib with soft tissue component measuring 3.7 x 5.7 cm. Lucent lesion with superior endplate compression fracture at T10 and additional smaller vertebral lucencies are likely metastatic. Review of the MIP images confirms the above findings. IMPRESSION: 1. Positive examination for bilateral segmental and subsegmental pulmonary emboli. 2. Large right suprahilar mass extending contiguously into the mediastinum and measuring about 6.5 cm maximal diameter. Malignant lesions are also demonstrated throughout the liver, in the tail of the pancreas, in the left anterior fifth rib and multiple vertebral bodies as well as in the left breast tissue and pancreas. The primary lesion is uncertain and could represent the lung mass which is the largest, the breast lesions, or the pancreatic lesion. Tissue sampling is recommended. 3. Small left pleural effusion with consolidation or atelectasis in the left lung base. 4. Cardiac enlargement with left ventricular hypertrophy and small pericardial effusion. Critical Value/emergent results were called by telephone at the time of interpretation on 08/22/2021 at 1:19 am to provider [facility] , who verbally acknowledged these results. Electronically Signed   By: Lucienne Capers M.D.   On: 08/22/2021 01:29   DG Chest 1 View  Result Date: 08/21/2021 CLINICAL DATA:  Chest pain, back pain EXAM: CHEST  1 VIEW COMPARISON:  05/13/2020 FINDINGS: Lungs volumes are small and there is left basilar atelectasis. No pneumothorax or pleural effusion. Cardiac size within normal limits. Pulmonary vascularity is normal. Osseous structures are age-appropriate. No acute bone abnormality. IMPRESSION: Pulmonary hypoinflation. Electronically Signed   By: Fidela Salisbury M.D.   On: 08/21/2021 21:57      Scheduled Meds:  oxyCODONE  10 mg Oral Q12H   Continuous Infusions:  heparin 1,400 Units/hr (08/23/21 0912)      LOS: 1 day   Time spent: 15min  Canuto Kingston C Latandra Loureiro, DO [facility]  If 7PM-7AM, please contact night-coverage [email or web address]  08/23/2021, 7:31 PM

## 2021-08-23 NOTE — Progress Notes (Signed)
  SATURATION QUALIFICATIONS: (This note is used to comply with regulatory documentation for home oxygen)  Patient Saturations on Room Air at Rest = 94%  Patient Saturations on Room Air while Ambulating = 86%  Patient Saturations on 2 Liters of oxygen while Ambulating = 98%  Please briefly explain why patient needs home oxygen: N/A  Holland Falling Mobility Specialist MS [facility] #:  [phone] Acute Rehab Office:  [phone]

## 2021-08-23 NOTE — Progress Notes (Signed)
ANTICOAGULATION CONSULT NOTE  Pharmacy Consult for Heparin Indication: pulmonary embolus  Allergies  Allergen Reactions   Fish Allergy Hives   Penicillins Hives   Augmentin [Amoxicillin-Pot Clavulanate] Hives   Betadine [Povidone Iodine] Hives    Bad hives per pt   Tetanus Toxoids Hives    Patient Measurements: Height: 5\' 9"  (175.3 cm) Weight: 83.9 kg (185 lb) IBW/kg (Calculated) : 66.2  Heparin Dosing Weight: 84 kg  Vital Signs: Temp: 98.5 F (36.9 C) (06/30 0749) Temp Source: Oral (06/30 0749) BP: 121/78 (06/30 0749) Pulse Rate: 92 (06/30 0749)  Labs: Recent Labs    08/21/21 2137 08/21/21 2333 08/22/21 0325 08/22/21 0806 08/22/21 1956 08/23/21 0149  HGB 10.5*  --  9.2*  --   --  9.6*  HCT 34.0*  --  29.8*  --   --  30.6*  PLT 384  --  357  --   --  366  HEPARINUNFRC  --   --   --  0.73* 0.62 0.64  CREATININE 0.75  --  0.67  --   --   --   TROPONINIHS 8 8  --   --   --   --     Estimated Creatinine Clearance: 88.7 mL/min (by C-G formula based on SCr of 0.67 mg/dL).   Assessment: 56 YO F with medical history of recently diagnosed metastatic cancer who presented with new SOB found to have bilateral pulmonary emboli. No anticoagulation PTA. Pharmacy consulted to manage heparin.  Hgb with small drop 10.5 > 9.6, platelets 366. No issues with heparin infusion or signs of bleeding reported. Heparin level at goal at x2 at 0.62 and 0.64 on heparin 1400 units/hr.    Goal of Therapy:  Heparin level 0.3-0.7 units/ml Monitor platelets by anticoagulation protocol: Yes   Plan:  Continue heparin infusion at 1400 units/hr Check heparin level daily while on heparin Continue to monitor H&H and platelets    Thank you for allowing pharmacy to be a part of this patient's care.  Ardyth Harps, PharmD Clinical Pharmacist

## 2021-08-24 DIAGNOSIS — I2699 Other pulmonary embolism without acute cor pulmonale: Secondary | ICD-10-CM | POA: Diagnosis not present

## 2021-08-24 LAB — CBC
HCT: 31.5 % — ABNORMAL LOW (ref 36.0–46.0)
Hemoglobin: 9.7 g/dL — ABNORMAL LOW (ref 12.0–15.0)
MCH: 23.9 pg — ABNORMAL LOW (ref 26.0–34.0)
MCHC: 30.8 g/dL (ref 30.0–36.0)
MCV: 77.6 fL — ABNORMAL LOW (ref 80.0–100.0)
Platelets: 319 10*3/uL (ref 150–400)
RBC: 4.06 MIL/uL (ref 3.87–5.11)
RDW: 14.6 % (ref 11.5–15.5)
WBC: 16.1 10*3/uL — ABNORMAL HIGH (ref 4.0–10.5)
nRBC: 0.1 % (ref 0.0–0.2)

## 2021-08-24 LAB — HEPARIN LEVEL (UNFRACTIONATED): Heparin Unfractionated: 0.54 IU/mL (ref 0.30–0.70)

## 2021-08-24 MED ORDER — HYDROMORPHONE HCL 1 MG/ML IJ SOLN
0.5000 mg | Freq: Four times a day (QID) | INTRAMUSCULAR | Status: DC | PRN
  Administered 2021-08-24 – 2021-08-25 (×3): 0.5 mg via INTRAVENOUS
  Filled 2021-08-24 (×3): qty 0.5

## 2021-08-24 MED ORDER — HYDROMORPHONE HCL 1 MG/ML IJ SOLN: 0.5000 mg | INTRAMUSCULAR | Status: DC | PRN

## 2021-08-24 MED ORDER — FLEET ENEMA 7-19 GM/118ML RE ENEM
1.0000 | ENEMA | Freq: Once | RECTAL | Status: AC
  Administered 2021-08-24: 1 via RECTAL
  Filled 2021-08-24: qty 1

## 2021-08-24 MED ORDER — OXYCODONE HCL ER 15 MG PO T12A: 15.0000 mg | EXTENDED_RELEASE_TABLET | Freq: Two times a day (BID) | ORAL | Status: DC

## 2021-08-24 MED ORDER — POLYETHYLENE GLYCOL 3350 17 G PO PACK
17.0000 g | PACK | Freq: Two times a day (BID) | ORAL | Status: DC
  Administered 2021-08-24 – 2021-08-26 (×5): 17 g via ORAL
  Filled 2021-08-24 (×5): qty 1

## 2021-08-24 MED ORDER — OXYCODONE HCL ER 15 MG PO T12A
15.0000 mg | EXTENDED_RELEASE_TABLET | Freq: Two times a day (BID) | ORAL | Status: DC
  Administered 2021-08-24 – 2021-08-25 (×3): 15 mg via ORAL
  Filled 2021-08-24 (×3): qty 1

## 2021-08-24 MED ORDER — SENNOSIDES-DOCUSATE SODIUM 8.6-50 MG PO TABS
1.0000 | ORAL_TABLET | Freq: Two times a day (BID) | ORAL | Status: DC
  Administered 2021-08-24 – 2021-08-26 (×5): 1 via ORAL
  Filled 2021-08-24 (×5): qty 1

## 2021-08-24 MED ORDER — DIPHENHYDRAMINE HCL 25 MG PO CAPS
25.0000 mg | ORAL_CAPSULE | ORAL | Status: DC | PRN
  Administered 2021-08-24: 25 mg via ORAL
  Filled 2021-08-24: qty 1

## 2021-08-24 NOTE — Progress Notes (Signed)
Mobility Specialist Criteria Algorithm Info.    08/24/21 1400  Pain Assessment  Pain Assessment 0-10  Pain Score 8  Pain Location back  Pain Descriptors / Indicators Discomfort;Grimacing;Sore  Pain Intervention(s) Premedicated before session  Mobility  Activity Transferred from chair to bed;Moved into chair position in bed; Refused Mobility (ambulation)  Range of Motion/Exercises Active;All extremities  Level of Assistance Contact guard assist, steadying assist  Assistive Device Front wheel walker  Distance Ambulated (ft) 5 ft  Activity Response Tolerated well   Patient deferred hallway ambulation second to 8/10 back pain. Requested assistance back to bed. Transfer B2B min guard with RW. Required max A to reposition in bed.Tolerated without complaint or incident. Was left in supine with all needs met, call bell in reach.   08/24/2021 2:12 PM  [name], [place], [place]  JZBFM:[phone] Office: [phone]

## 2021-08-24 NOTE — Progress Notes (Signed)
ANTICOAGULATION CONSULT NOTE  Pharmacy Consult for Heparin Indication: pulmonary embolus  Allergies  Allergen Reactions   Fish Allergy Hives    **SHELLFISH   Penicillins Hives   Augmentin [Amoxicillin-Pot Clavulanate] Hives   Betadine [Povidone Iodine] Hives    Bad hives per pt   Tetanus Toxoids Hives    Patient Measurements: Height: 5\' 9"  (175.3 cm) Weight: 83.9 kg (185 lb) IBW/kg (Calculated) : 66.2 Heparin Dosing Weight: 84 kg  Vital Signs: Temp: 98 F (36.7 C) (07/01 0405) Temp Source: Oral (07/01 0405) BP: 97/75 (07/01 0405) Pulse Rate: 98 (07/01 0405)  Labs: Recent Labs    08/21/21 2137 08/21/21 2333 08/22/21 0325 08/22/21 0806 08/22/21 1956 08/23/21 0149 08/24/21 0058  HGB 10.5*  --  9.2*  --   --  9.6* 9.7*  HCT 34.0*  --  29.8*  --   --  30.6* 31.5*  PLT 384  --  357  --   --  366 319  HEPARINUNFRC  --   --   --    < > 0.62 0.64 0.54  CREATININE 0.75  --  0.67  --   --   --   --   TROPONINIHS 8 8  --   --   --   --   --    < > = values in this interval not displayed.     Estimated Creatinine Clearance: 88.7 mL/min (by C-G formula based on SCr of 0.67 mg/dL).   Assessment: 56 YO F with medical history of recently diagnosed metastatic cancer who presented with new SOB found to have bilateral pulmonary emboli. No anticoagulation PTA. Pharmacy consulted to manage heparin.  Heparin level therapeutic (0.54) on infusions at 1400 units/hr. CBC stable. No bleeding noted.  Goal of Therapy:  Heparin level 0.3-0.7 units/ml Monitor platelets by anticoagulation protocol: Yes   Plan:  Continue heparin infusion at 1400 units/hr Check heparin level daily while on heparin Continue to monitor H&H and platelets F/u transition to [place], PharmD, BCPS Please see amion for complete clinical pharmacist phone list 08/24/2021 7:25 AM

## 2021-08-24 NOTE — Progress Notes (Signed)
PROGRESS NOTE    [name]  OVZ:[id number] DOB: [birth year] DOA: 08/21/2021 PCP: Merryl Hacker, No   Brief Narrative:  [name] is a 56 y.o. female with medical history significant of metastatic adenocarcinoma (presumably of the lung), who is being admitted for acute bilateral pulmonary emboli after presenting with new onset left-sided pleuritic chest pain associate with shortness of breath over the last few days.  Patient had been previously living in [place] with work-up there for abnormal CT scan showing a PET scan with metastatic disease, distant history of left upper lobectomy 2020 for presumed primary adenocarcinoma.  Liver biopsy at outpatient facility remarkable for adenocarcinoma.     Patient admitted with worsening chest pain, hypoxia and tachycardia in the setting of acute bilateral pulmonary emboli.  Initiated on heparin drip, discussed with oncology for close outpatient follow-up, once pain is well controlled and hypoxia has resolved or least improved patient will discharge home with very close outpatient follow-up for further discussion on treatment options as well as referral to palliative care given patient's metastatic disease a curative course is unlikely.     Assessment & Plan:   Principal Problem:   Acute pulmonary embolism (HCC) Active Problems:   Acute pulmonary embolism, unspecified pulmonary embolism type, unspecified whether acute cor pulmonale present (HCC)  Acute hypoxic respiratory failure in the setting of acute provoked PE, POA -Continue heparin drip, transition to DOAC at discharge -Provoked in the setting of adenocarcinoma, metastatic, discussed with oncology for outpatient follow-up as below -Continue pain control, titrate off IV narcotics as appropriate -Pain is poorly controlled today, increasing both long-acting and short acting p.o. medications in hopes to wean off IV Dilaudid. -Patient without hypoxia today, ambulatory oxygen screen  pending   Metastatic adenocarcinoma Lung primary presumed -Discussed with oncology, close outpatient follow-up next week -Pathology in care everywhere at previous facility in [place] -We will likely follow-up with Dr. Earlie Server given adenocarcinoma of the lung, appears to be recurrent given negative imaging and surgery from 2020  Acute intractable pain -Multifactorial in the setting of above -Concern for metastatic disease to bone/spine given location of pain -Wean off IV dilaudid - increase PO oxycodone PRN and oxycontin scheduled doses -Discussed briefly with oncology - will attempt to get PET scan results from [place] - may have more advanced disease than previously noted. Possible transition to PCA pump in 24 hours if pain stil poorly controlled  DVT prophylaxis: heparin gtt Code Status: Full Family Communication: At bedside  Status is: Inpt  Dispo: The patient is from: Home              Anticipated d/c is to: Home              Anticipated d/c date is: TBD (24-48h)              Patient currently NOT medically stable for discharge  Consultants:  None  Procedures:  None  Antimicrobials:  None   Subjective: No acute issues/events overnight, pain remains poorly controlled requiring IV narcotics for symptom management even this afternoon at bedside.  Objective: Vitals:   08/23/21 1940 08/24/21 0405 08/24/21 0737 08/24/21 1505  BP: (!) 138/93 97/75 (!) 135/96 131/80  Pulse: (!) 103 98 (!) 116 (!) 105  Resp: 16 18    Temp: 98.6 F (37 C) 98 F (36.7 C) 97.8 F (36.6 C) 98.6 F (37 C)  TempSrc: Oral Oral Oral Oral  SpO2: 97% 92% 94% 94%  Weight:  Height:        Intake/Output Summary (Last 24 hours) at 08/24/2021 1731 Last data filed at 08/24/2021 1400 Gross per 24 hour  Intake 581.91 ml  Output --  Net 581.91 ml    Filed Weights   08/22/21 0148  Weight: 83.9 kg    Examination:  General exam: Uncomfortable somewhat diaphoretic, no acute  distress Respiratory system: Clear to auscultation. Respiratory effort normal. Cardiovascular system: S1 & S2 heard, RRR. No JVD, murmurs, rubs, gallops or clicks. No pedal edema. Gastrointestinal system: Abdomen is nondistended, soft and nontender. No organomegaly or masses felt. Normal bowel sounds heard. Central nervous system: Alert and oriented. No focal neurological deficits. Extremities: Symmetric 5 x 5 power. Skin: No rashes, lesions or ulcers Psychiatry: Judgement and insight appear normal. Mood & affect appropriate.     Data Reviewed: I have personally reviewed following labs and imaging studies  CBC: Recent Labs  Lab 08/21/21 2137 08/22/21 0325 08/23/21 0149 08/24/21 0058  WBC 17.0* 17.3* 16.8* 16.1*  NEUTROABS  --  12.4*  --   --   HGB 10.5* 9.2* 9.6* 9.7*  HCT 34.0* 29.8* 30.6* 31.5*  MCV 77.4* 78.2* 77.7* 77.6*  PLT 384 357 366 875    Basic Metabolic Panel: Recent Labs  Lab 08/21/21 2137 08/22/21 0325  NA 138 136  K 4.1 4.0  CL 102 103  CO2 22 22  GLUCOSE 123* 100*  BUN 12 12  CREATININE 0.75 0.67  CALCIUM 9.4 9.0  MG  --  2.0    GFR: Estimated Creatinine Clearance: 88.7 mL/min (by C-G formula based on SCr of 0.67 mg/dL). Liver Function Tests: Recent Labs  Lab 08/22/21 0325  AST 33  ALT 27  ALKPHOS 219*  BILITOT 0.4  PROT 6.8  ALBUMIN 2.6*    No results for input(s): "LIPASE", "AMYLASE" in the last 168 hours. No results for input(s): "AMMONIA" in the last 168 hours. Coagulation Profile: No results for input(s): "INR", "PROTIME" in the last 168 hours. Cardiac Enzymes: No results for input(s): "CKTOTAL", "CKMB", "CKMBINDEX", "TROPONINI" in the last 168 hours. BNP (last 3 results) No results for input(s): "PROBNP" in the last 8760 hours. HbA1C: No results for input(s): "HGBA1C" in the last 72 hours. CBG: No results for input(s): "GLUCAP" in the last 168 hours. Lipid Profile: No results for input(s): "CHOL", "HDL", "LDLCALC", "TRIG",  "CHOLHDL", "LDLDIRECT" in the last 72 hours. Thyroid Function Tests: No results for input(s): "TSH", "T4TOTAL", "FREET4", "T3FREE", "THYROIDAB" in the last 72 hours. Anemia Panel: No results for input(s): "VITAMINB12", "FOLATE", "FERRITIN", "TIBC", "IRON", "RETICCTPCT" in the last 72 hours. Sepsis Labs: No results for input(s): "PROCALCITON", "LATICACIDVEN" in the last 168 hours.  No results found for this or any previous visit (from the past 240 hour(s)).       Radiology Studies: No results found.    Scheduled Meds:  oxyCODONE  15 mg Oral Q12H   polyethylene glycol  17 g Oral BID   senna-docusate  1 tablet Oral BID   Continuous Infusions:  heparin 1,400 Units/hr (08/24/21 0224)     LOS: 2 days   Time spent: 82min  Wessley Emert C Neleh Muldoon, DO [facility]  If 7PM-7AM, please contact night-coverage [email or web address]  08/24/2021, 5:31 PM

## 2021-08-24 NOTE — Progress Notes (Signed)
Patient with home meds in room (Gabapentin, linzess, trazodone, & eye drops) Patient advised that she can not take. Patient verbally stated that she would not. RN asked MD if patient would be able to get orders for these home meds, but he advised that patient speak to her attending about adding medications.

## 2021-08-25 DIAGNOSIS — K5903 Drug induced constipation: Secondary | ICD-10-CM | POA: Diagnosis not present

## 2021-08-25 DIAGNOSIS — T402X5A Adverse effect of other opioids, initial encounter: Secondary | ICD-10-CM | POA: Diagnosis not present

## 2021-08-25 DIAGNOSIS — I2699 Other pulmonary embolism without acute cor pulmonale: Secondary | ICD-10-CM | POA: Diagnosis not present

## 2021-08-25 DIAGNOSIS — J9601 Acute respiratory failure with hypoxia: Secondary | ICD-10-CM | POA: Diagnosis not present

## 2021-08-25 LAB — CBC
HCT: 32.2 % — ABNORMAL LOW (ref 36.0–46.0)
Hemoglobin: 10 g/dL — ABNORMAL LOW (ref 12.0–15.0)
MCH: 24.2 pg — ABNORMAL LOW (ref 26.0–34.0)
MCHC: 31.1 g/dL (ref 30.0–36.0)
MCV: 77.8 fL — ABNORMAL LOW (ref 80.0–100.0)
Platelets: 409 10*3/uL — ABNORMAL HIGH (ref 150–400)
RBC: 4.14 MIL/uL (ref 3.87–5.11)
RDW: 14.4 % (ref 11.5–15.5)
WBC: 15.9 10*3/uL — ABNORMAL HIGH (ref 4.0–10.5)
nRBC: 0.2 % (ref 0.0–0.2)

## 2021-08-25 LAB — HEPARIN LEVEL (UNFRACTIONATED): Heparin Unfractionated: 0.49 IU/mL (ref 0.30–0.70)

## 2021-08-25 MED ORDER — OXYCODONE HCL ER 10 MG PO T12A
20.0000 mg | EXTENDED_RELEASE_TABLET | Freq: Two times a day (BID) | ORAL | Status: DC
  Administered 2021-08-25 – 2021-08-26 (×2): 20 mg via ORAL
  Filled 2021-08-25 (×2): qty 2

## 2021-08-25 MED ORDER — POLYETHYLENE GLYCOL 3350 17 GM/SCOOP PO POWD
1.0000 | Freq: Once | ORAL | Status: AC
Start: 2021-08-25 — End: 2021-08-25
  Administered 2021-08-25: 255 g via ORAL
  Filled 2021-08-25 (×2): qty 255

## 2021-08-25 MED ORDER — APIXABAN 5 MG PO TABS: 5.0000 mg | ORAL_TABLET | Freq: Two times a day (BID) | ORAL | Status: DC

## 2021-08-25 MED ORDER — APIXABAN 5 MG PO TABS
10.0000 mg | ORAL_TABLET | Freq: Two times a day (BID) | ORAL | Status: DC
  Administered 2021-08-25 – 2021-08-26 (×3): 10 mg via ORAL
  Filled 2021-08-25 (×3): qty 2

## 2021-08-25 NOTE — Progress Notes (Signed)
PROGRESS NOTE    [name]  OZH:[id number] DOB: 1965/03/27 DOA: 08/21/2021 PCP: Merryl Hacker, No   Brief Narrative:  [name] is a 56 y.o. female with medical history significant of metastatic adenocarcinoma (presumably of the lung), who is being admitted for acute bilateral pulmonary emboli after presenting with new onset left-sided pleuritic chest pain associate with shortness of breath over the last few days.  Patient had been previously living in [place] with work-up there for abnormal CT scan showing a PET scan with metastatic disease, distant history of left upper lobectomy 2020 for presumed primary adenocarcinoma.  Liver biopsy at outpatient facility remarkable for adenocarcinoma.     Patient admitted with worsening chest pain, hypoxia and tachycardia in the setting of acute bilateral pulmonary emboli.  Initiated on heparin drip, discussed with oncology for close outpatient follow-up, once pain is well controlled and hypoxia has resolved or least improved patient will discharge home with very close outpatient follow-up for further discussion on treatment options as well as referral to palliative care given patient's metastatic disease a curative course is unlikely.     Assessment & Plan:   Principal Problem:   Acute pulmonary embolism (HCC) Active Problems:   Acute pulmonary embolism, unspecified pulmonary embolism type, unspecified whether acute cor pulmonale present (HCC)  Acute hypoxic respiratory failure in the setting of acute provoked PE, POA -Transition to Eliquis -Provoked in the setting of adenocarcinoma, metastatic, discussed with oncology for outpatient follow-up as below -Continue pain control, titrate off IV narcotics as appropriate -Pain continues to be poorly controlled today, increasing both long-acting and short acting p.o. medications in hopes to wean off IV Dilaudid. -Patient without hypoxia today, ambulatory oxygen screen pending   Metastatic  adenocarcinoma Lung primary presumed -Discussed with oncology, close outpatient follow-up next week -Pathology in care everywhere at previous facility in [place] -We will likely follow-up with Dr. Earlie Server given adenocarcinoma of the lung, appears to be recurrent given negative imaging and surgery from 2020  Acute intractable pain -Multifactorial in the setting of above -Concern for metastatic disease to bone/spine given location of pain -Increase OxyContin 20 mg twice daily, continue 1-2 Percocet 5 mg as needed -Decrease Dilaudid every 6 hours 0.5 mg -Discussed briefly with oncology - will attempt to get PET scan results from [place] - may have more advanced disease than previously noted. Possible transition to PCA pump in 24 hours if pain stil poorly controlled  DVT prophylaxis: heparin gtt Code Status: Full Family Communication: At bedside  Status is: Inpt  Dispo: The patient is from: Home              Anticipated d/c is to: Home              Anticipated d/c date is: TBD (24-48h)              Patient currently NOT medically stable for discharge  Consultants:  None  Procedures:  None  Antimicrobials:  None   Subjective: No acute issues/events overnight, pain remains poorly controlled requiring IV narcotics for symptom management even this afternoon at bedside.  Objective: Vitals:   08/24/21 1505 08/24/21 2025 08/25/21 0500 08/25/21 0930  BP: 131/80 119/78  119/85  Pulse: (!) 105 100    Resp:    20  Temp: 98.6 F (37 C) 98.6 F (37 C)  98.9 F (37.2 C)  TempSrc: Oral Oral  Oral  SpO2: 94% 100%  96%  Weight:   82.3 kg   Height:  Intake/Output Summary (Last 24 hours) at 08/25/2021 1450 Last data filed at 08/24/2021 1805 Gross per 24 hour  Intake 277.93 ml  Output --  Net 277.93 ml    Filed Weights   08/22/21 0148 08/25/21 0500  Weight: 83.9 kg 82.3 kg    Examination:  General exam: Uncomfortable somewhat diaphoretic, no acute distress Respiratory  system: Clear to auscultation. Respiratory effort normal. Cardiovascular system: S1 & S2 heard, RRR. No JVD, murmurs, rubs, gallops or clicks. No pedal edema. Gastrointestinal system: Abdomen is nondistended, soft and nontender. No organomegaly or masses felt. Normal bowel sounds heard. Central nervous system: Alert and oriented. No focal neurological deficits. Extremities: Symmetric 5 x 5 power. Skin: No rashes, lesions or ulcers Psychiatry: Judgement and insight appear normal. Mood & affect appropriate.     Data Reviewed: I have personally reviewed following labs and imaging studies  CBC: Recent Labs  Lab 08/21/21 2137 08/22/21 0325 08/23/21 0149 08/24/21 0058 08/25/21 0033  WBC 17.0* 17.3* 16.8* 16.1* 15.9*  NEUTROABS  --  12.4*  --   --   --   HGB 10.5* 9.2* 9.6* 9.7* 10.0*  HCT 34.0* 29.8* 30.6* 31.5* 32.2*  MCV 77.4* 78.2* 77.7* 77.6* 77.8*  PLT 384 357 366 319 409*    Basic Metabolic Panel: Recent Labs  Lab 08/21/21 2137 08/22/21 0325  NA 138 136  K 4.1 4.0  CL 102 103  CO2 22 22  GLUCOSE 123* 100*  BUN 12 12  CREATININE 0.75 0.67  CALCIUM 9.4 9.0  MG  --  2.0    GFR: Estimated Creatinine Clearance: 87.9 mL/min (by C-G formula based on SCr of 0.67 mg/dL). Liver Function Tests: Recent Labs  Lab 08/22/21 0325  AST 33  ALT 27  ALKPHOS 219*  BILITOT 0.4  PROT 6.8  ALBUMIN 2.6*    No results for input(s): "LIPASE", "AMYLASE" in the last 168 hours. No results for input(s): "AMMONIA" in the last 168 hours. Coagulation Profile: No results for input(s): "INR", "PROTIME" in the last 168 hours. Cardiac Enzymes: No results for input(s): "CKTOTAL", "CKMB", "CKMBINDEX", "TROPONINI" in the last 168 hours. BNP (last 3 results) No results for input(s): "PROBNP" in the last 8760 hours. HbA1C: No results for input(s): "HGBA1C" in the last 72 hours. CBG: No results for input(s): "GLUCAP" in the last 168 hours. Lipid Profile: No results for input(s): "CHOL",  "HDL", "LDLCALC", "TRIG", "CHOLHDL", "LDLDIRECT" in the last 72 hours. Thyroid Function Tests: No results for input(s): "TSH", "T4TOTAL", "FREET4", "T3FREE", "THYROIDAB" in the last 72 hours. Anemia Panel: No results for input(s): "VITAMINB12", "FOLATE", "FERRITIN", "TIBC", "IRON", "RETICCTPCT" in the last 72 hours. Sepsis Labs: No results for input(s): "PROCALCITON", "LATICACIDVEN" in the last 168 hours.  No results found for this or any previous visit (from the past 240 hour(s)).       Radiology Studies: No results found.    Scheduled Meds:  apixaban  10 mg Oral BID   Followed by   Derrill Memo ON 09/01/2021] apixaban  5 mg Oral BID   oxyCODONE  20 mg Oral Q12H   polyethylene glycol  17 g Oral BID   polyethylene glycol powder  1 Container Oral Once   senna-docusate  1 tablet Oral BID   Continuous Infusions:     LOS: 3 days   Time spent: 2min  Marlea Gambill C Chelan Heringer, DO [facility]  If 7PM-7AM, please contact night-coverage [email or web address]  08/25/2021, 2:50 PM

## 2021-08-25 NOTE — Progress Notes (Addendum)
[facility] for Heparin ->apixaban Indication: pulmonary embolus  Allergies  Allergen Reactions   Fish Allergy Hives    **SHELLFISH   Penicillins Hives   Augmentin [Amoxicillin-Pot Clavulanate] Hives   Betadine [Povidone Iodine] Hives    Bad hives per pt   Tetanus Toxoids Hives    Patient Measurements: Height: 5\' 9"  (175.3 cm) Weight: 82.3 kg (181 lb 7 oz) IBW/kg (Calculated) : 66.2 Heparin Dosing Weight: 84 kg  Vital Signs: Temp: 98.9 F (37.2 C) (07/02 0930) Temp Source: Oral (07/02 0930) BP: 119/85 (07/02 0930)  Labs: Recent Labs    08/23/21 0149 08/24/21 0058 08/25/21 0033  HGB 9.6* 9.7* 10.0*  HCT 30.6* 31.5* 32.2*  PLT 366 319 409*  HEPARINUNFRC 0.64 0.54 0.49     Estimated Creatinine Clearance: 87.9 mL/min (by C-G formula based on SCr of 0.67 mg/dL).   Assessment: 56 YO F with medical history of recently diagnosed metastatic cancer who presented with new SOB found to have bilateral pulmonary emboli. No anticoagulation PTA. Pharmacy consulted to manage heparin.  Heparin level therapeutic (0.49) on infusions at 1400 units/hr. CBC stable. No bleeding noted.  Goal of Therapy:  Heparin level 0.3-0.7 units/ml Monitor platelets by anticoagulation protocol: Yes   Plan:  Continue heparin infusion at 1400 units/hr Check heparin level daily while on heparin Continue to monitor H&H and platelets F/u transition to Newton Hamilton, PharmD, BCPS Please see amion for complete clinical pharmacist phone list 08/25/2021 1:06 PM  Addendum (1340) Okay to transition to heparin to apixaban per MD. Noted copay is $40/month.  Plan: Apixaban 10mg  po BID x 7 days then on 7/9 change to 5mg  po BID D/c heparin when first dose of apixaban given  Sherlon Handing, PharmD, BCPS Please see amion for complete clinical pharmacist phone list 08/25/2021 1:38 PM

## 2021-08-25 NOTE — Discharge Summary (Signed)
Physician Discharge Summary  [name] OIZ:[id number] DOB: [birth year] DOA: 08/21/2021  PCP: Pcp, No  Admit date: 08/21/2021 Discharge date: 08/26/2021  Admitted From: Home Disposition: Home  Recommendations for Outpatient Follow-up:  Follow up with PCP in 1-2 weeks Follow-up with oncology as scheduled  Home Health: None Equipment/Devices: None  Discharge Condition: Stable CODE STATUS: Full Diet recommendation: Regular diet  Brief/Interim Summary: [name] is a 56 y.o. female with medical history significant of metastatic adenocarcinoma (presumably of the lung), who is being admitted for acute bilateral pulmonary emboli after presenting with new onset left-sided pleuritic chest pain associate with shortness of breath over the last few days.  Patient had been previously living in [place] with work-up there for abnormal CT scan showing a PET scan with metastatic disease, distant history of left upper lobectomy 2020 for presumed primary adenocarcinoma.  Liver biopsy at outpatient facility remarkable for adenocarcinoma.     Patient admitted with worsening chest pain, hypoxia and tachycardia in the setting of acute bilateral pulmonary emboli.  Initiated on heparin drip, discussed with oncology for close outpatient follow-up, once pain is well controlled and hypoxia has resolved or least improved patient will discharge home with very close outpatient follow-up for further discussion on treatment options as well as referral to palliative care given patient's metastatic disease.  Pain currently well controlled today, patient had mild hypoxia with exertion, will discharge home with 2 L nasal cannula oxygen for exertion only, otherwise stable and agreeable for discharge home.  Will discharge on Eliquis as discussed. Patient's clinical course was complicated by profound constipation, had been admitted with approximately 5 days of constipation only exacerbated by increased narcotics  here in the inpatient setting.  At this time after increasing patient's PO and PR medications she was able to have a bowel movement, explained the importance of continuing treatment of constipation at home in the setting of elevated narcotic use.   Discharge Diagnoses:  Principal Problem:   Acute pulmonary embolism (HCC) Active Problems:   Acute pulmonary embolism, unspecified pulmonary embolism type, unspecified whether acute cor pulmonale present (HCC)   Acute hypoxic respiratory failure in the setting of acute provoked PE, POA -Continue Eliquis -Provoked in the setting of adenocarcinoma, metastatic, discussed with oncology for outpatient follow-up as below -Pain currently well controlled, ambulatory oxygen provided at discharge   Metastatic adenocarcinoma Lung primary presumed -Discussed with oncology, close outpatient follow-up next week -Pathology in care everywhere at previous facility in [place] -We will likely follow-up with Dr. Earlie Server given adenocarcinoma of the lung, appears to be recurrent given negative imaging and surgery from 2020   Acute intractable pain -Multifactorial in the setting of above -Concern for metastatic disease to bone/spine given location of pain -Wean off IV dilaudid - increase PO oxycodone PRN and oxycontin scheduled doses -Discussed briefly with oncology - will attempt to get PET scan results from [place] - may have more advanced disease than previously noted. Possible transition to PCA pump in 24 hours if pain stil poorly controlled   Discharge Instructions  Discharge Instructions     Discharge patient   Complete by: As directed    Discharge disposition: 01-Home or Self Care   Discharge patient date: 08/26/2021      Allergies as of 08/26/2021       Reactions   Fish Allergy Hives   **SHELLFISH   Penicillins Hives   Augmentin [amoxicillin-pot Clavulanate] Hives   Betadine [povidone Iodine] Hives   Bad hives per pt  Tetanus Toxoids Hives         Medication List     STOP taking these medications    HYDROcodone-acetaminophen 7.5-325 MG tablet Commonly known as: NORCO   Tagrisso 80 MG tablet Generic drug: osimertinib mesylate       TAKE these medications    acetaminophen 325 MG tablet Commonly known as: TYLENOL Take 2 tablets (650 mg total) by mouth every 6 (six) hours as needed for mild pain (or Fever >/= 101).   apixaban 5 MG Tabs tablet Commonly known as: ELIQUIS Take 2 tablets (10 mg total) by mouth 2 (two) times daily for 5 days.   apixaban 5 MG Tabs tablet Commonly known as: ELIQUIS Take 1 tablet (5 mg total) by mouth 2 (two) times daily. Start taking on: September 01, 2021   baclofen 10 MG tablet Commonly known as: LIORESAL Take 10 mg by mouth 3 (three) times daily. Notes to patient: Resume home regimen   famotidine 20 MG tablet Commonly known as: PEPCID Take 20 mg by mouth 2 (two) times daily. Notes to patient: Resume home regimen   gabapentin 100 MG capsule Commonly known as: NEURONTIN Take 200 mg by mouth 2 (two) times daily. Notes to patient: Resume home regimen   oxyCODONE 20 mg 12 hr tablet Commonly known as: OXYCONTIN Take 1 tablet (20 mg total) by mouth every 12 (twelve) hours.   oxyCODONE-acetaminophen 5-325 MG tablet Commonly known as: PERCOCET/ROXICET Take 1-2 tablets by mouth every 4 (four) hours as needed for moderate pain or severe pain. Notes to patient: Last dose given 07/03 04:13pm   polyethylene glycol 17 g packet Commonly known as: MIRALAX / GLYCOLAX Take 17 g by mouth 2 (two) times daily.   senna-docusate 8.6-50 MG tablet Commonly known as: Senokot-S Take 1 tablet by mouth 2 (two) times daily.   traZODone 50 MG tablet Commonly known as: DESYREL Take 100 mg by mouth at bedtime. Notes to patient: Resume home regimen               Durable Medical Equipment  (From admission, onward)           Start     Ordered   08/26/21 1551  For home use only DME  oxygen  Once       Question Answer Comment  Length of Need 6 Months   Mode or (Route) Nasal cannula   Liters per Minute 2   Frequency Continuous (stationary and portable oxygen unit needed)   Oxygen delivery system Gas      08/26/21 1550   08/26/21 1531  For home use only DME oxygen  Once       Question Answer Comment  Length of Need 6 Months   Mode or (Route) Nasal cannula   Liters per Minute 2   Frequency Continuous (stationary and portable oxygen unit needed)   Oxygen conserving device No   Oxygen delivery system Gas      08/26/21 1531            Allergies  Allergen Reactions   Fish Allergy Hives    **SHELLFISH   Penicillins Hives   Augmentin [Amoxicillin-Pot Clavulanate] Hives   Betadine [Povidone Iodine] Hives    Bad hives per pt   Tetanus Toxoids Hives    Consultations: None -oncology discussed for outpatient follow-up   Procedures/Studies: ECHOCARDIOGRAM COMPLETE  Result Date: 08/22/2021    ECHOCARDIOGRAM REPORT   Patient Name:   [name] Date of Exam: 08/22/2021 Medical Rec #:  [id number]        Height:       69.0 in Accession #:    [id number]       Weight:       185.0 lb Date of Birth:  1965/07/11         BSA:          1.999 m Patient Age:    8 years         BP:           120/80 mmHg Patient Gender: F                HR:           100 bpm. Exam Location:  Inpatient Procedure: 2D Echo, Cardiac Doppler and Color Doppler Indications:    Pulmonary embolism  History:        Patient has no prior history of Echocardiogram examinations.                 Signs/Symptoms:Chest Pain and Shortness of Breath.  Sonographer:    Joette Catching RCS Referring Phys: [id number] [place]  1. Left ventricular ejection fraction, by estimation, is 60 to 65%. The left ventricle has normal function. The left ventricle has no regional wall motion abnormalities. Left ventricular diastolic parameters are consistent with Grade I diastolic dysfunction (impaired  relaxation).  2. Right ventricular systolic function is normal. The right ventricular size is normal. There is moderately elevated pulmonary artery systolic pressure. The estimated right ventricular systolic pressure is 30.1 mmHg.  3. The mitral valve is normal in structure. No evidence of mitral valve regurgitation. No evidence of mitral stenosis.  4. The aortic valve is normal in structure. Aortic valve regurgitation is not visualized. No aortic stenosis is present.  5. The inferior vena cava is normal in size with <50% respiratory variability, suggesting right atrial pressure of 8 mmHg.  6. There is a small to moderate circumferential pericardial effusion present. The pericardial effusion measures 1.45cm at greatest diameter posteriorly.     There is no evidence of cardiac tamponade. FINDINGS  Left Ventricle: Left ventricular ejection fraction, by estimation, is 60 to 65%. The left ventricle has normal function. The left ventricle has no regional wall motion abnormalities. The left ventricular internal cavity size was normal in size. There is  no left ventricular hypertrophy. Left ventricular diastolic parameters are consistent with Grade I diastolic dysfunction (impaired relaxation). Normal left ventricular filling pressure. Right Ventricle: The right ventricular size is normal. No increase in right ventricular wall thickness. Right ventricular systolic function is normal. There is moderately elevated pulmonary artery systolic pressure. The tricuspid regurgitant velocity is 3.45 m/s, and with an assumed right atrial pressure of 8 mmHg, the estimated right ventricular systolic pressure is 60.1 mmHg. Left Atrium: Left atrial size was normal in size. Right Atrium: Right atrial size was normal in size. Pericardium: The pericardial effusion measures 1.45cm at greatest diameter posteriorly. A small pericardial effusion is present. The pericardial effusion is circumferential. There is no evidence of cardiac tamponade.  Mitral Valve: The mitral valve is normal in structure. No evidence of mitral valve regurgitation. No evidence of mitral valve stenosis. Tricuspid Valve: The tricuspid valve is normal in structure. Tricuspid valve regurgitation is mild . No evidence of tricuspid stenosis. Aortic Valve: The aortic valve is normal in structure. Aortic valve regurgitation is not visualized. No aortic stenosis is present. Aortic valve mean gradient measures 5.0 mmHg. Aortic valve peak gradient measures 8.6 mmHg.  Aortic valve area, by VTI measures 2.81 cm. Pulmonic Valve: The pulmonic valve was normal in structure. Pulmonic valve regurgitation is not visualized. No evidence of pulmonic stenosis. Aorta: The aortic root is normal in size and structure. Venous: The inferior vena cava is normal in size with less than 50% respiratory variability, suggesting right atrial pressure of 8 mmHg. IAS/Shunts: No atrial level shunt detected by color flow Doppler.  LEFT VENTRICLE PLAX 2D LVIDd:         3.50 cm   Diastology LVIDs:         2.20 cm   LV e' medial:    7.18 cm/s LV PW:         1.00 cm   LV E/e' medial:  9.2 LV IVS:        0.90 cm   LV e' lateral:   10.60 cm/s LVOT diam:     2.10 cm   LV E/e' lateral: 6.2 LV SV:         73 LV SV Index:   37 LVOT Area:     3.46 cm  RIGHT VENTRICLE             IVC RV Basal diam:  3.10 cm     IVC diam: 1.70 cm RV Mid diam:    2.30 cm RV S prime:     14.20 cm/s TAPSE (M-mode): 2.0 cm LEFT ATRIUM             Index        RIGHT ATRIUM           Index LA diam:        2.70 cm 1.35 cm/m   RA Area:     10.70 cm LA Vol (A2C):   29.2 ml 14.61 ml/m  RA Volume:   23.00 ml  11.51 ml/m LA Vol (A4C):   22.8 ml 11.41 ml/m LA Biplane Vol: 27.1 ml 13.56 ml/m  AORTIC VALVE AV Area (Vmax):    3.04 cm AV Area (Vmean):   2.82 cm AV Area (VTI):     2.81 cm AV Vmax:           147.00 cm/s AV Vmean:          110.000 cm/s AV VTI:            0.261 m AV Peak Grad:      8.6 mmHg AV Mean Grad:      5.0 mmHg LVOT Vmax:          129.00 cm/s LVOT Vmean:        89.600 cm/s LVOT VTI:          0.212 m LVOT/AV VTI ratio: 0.81  AORTA Ao Root diam: 3.10 cm Ao Asc diam:  2.70 cm MITRAL VALVE               TRICUSPID VALVE MV Area (PHT): 9.14 cm    TR Peak grad:   47.6 mmHg MV Decel Time: 83 msec     TR Vmax:        345.00 cm/s MV E velocity: 66.00 cm/s MV A velocity: 91.30 cm/s  SHUNTS MV E/A ratio:  0.72        Systemic VTI:  0.21 m                            Systemic Diam: 2.10 cm Fransico Him MD Electronically signed by Fransico Him MD Signature Date/Time: 08/22/2021/10:10:00  AM    Final    CT Angio Chest PE W/Cm &/Or Wo Cm  Result Date: 08/22/2021 CLINICAL DATA:  Pulmonary embolus suspected. Positive D-dimer. Chest pain and shortness of breath. EXAM: CT ANGIOGRAPHY CHEST WITH CONTRAST TECHNIQUE: Multidetector CT imaging of the chest was performed using the standard protocol during bolus administration of intravenous contrast. Multiplanar CT image reconstructions and MIPs were obtained to evaluate the vascular anatomy. RADIATION DOSE REDUCTION: This exam was performed according to the departmental dose-optimization program which includes automated exposure control, adjustment of the mA and/or kV according to patient size and/or use of iterative reconstruction technique. CONTRAST:  41mL OMNIPAQUE IOHEXOL 350 MG/ML SOLN COMPARISON:  None Available. FINDINGS: Cardiovascular: Good opacification of the central and segmental pulmonary arteries. The examination is positive for multiple pulmonary emboli demonstrated in bilateral upper and lower lobe segmental and subsegmental branches. No large central pulmonary embolus. The RV to LV ratio is elevated at 1.1, however, there is evidence of left ventricular hypertrophy which may be responsible for this. Cardiac enlargement. Small pericardial effusion. Normal caliber thoracic aorta. No aortic dissection. Mediastinum/Nodes: There is a large right suprahilar mass extending into the mediastinum and likely  involving right hilar and mediastinal lymph nodes as well. The mass surrounds the right mainstem bronchus and crosses the midline at the level of the carina. Overall, the mass measures about 4.4 x 6.5 cm. The esophagus is decompressed. Lungs/Pleura: Small left pleural effusion with consolidation or atelectasis in the left lung base. Mild atelectasis in the right lung base. No pneumothorax. Upper Abdomen: Diffusely heterogeneous nodular appearance to the liver likely indicating diffuse liver metastasis. There is a mass in the tail of the pancreas measuring 3.3 cm diameter. Mass lesions are identified in the left inferior outer breast tissue, with largest measuring 2.7 cm in diameter. Musculoskeletal: There is an expansile mass lesion involving the anterior left fifth rib with soft tissue component measuring 3.7 x 5.7 cm. Lucent lesion with superior endplate compression fracture at T10 and additional smaller vertebral lucencies are likely metastatic. Review of the MIP images confirms the above findings. IMPRESSION: 1. Positive examination for bilateral segmental and subsegmental pulmonary emboli. 2. Large right suprahilar mass extending contiguously into the mediastinum and measuring about 6.5 cm maximal diameter. Malignant lesions are also demonstrated throughout the liver, in the tail of the pancreas, in the left anterior fifth rib and multiple vertebral bodies as well as in the left breast tissue and pancreas. The primary lesion is uncertain and could represent the lung mass which is the largest, the breast lesions, or the pancreatic lesion. Tissue sampling is recommended. 3. Small left pleural effusion with consolidation or atelectasis in the left lung base. 4. Cardiac enlargement with left ventricular hypertrophy and small pericardial effusion. Critical Value/emergent results were called by telephone at the time of interpretation on 08/22/2021 at 1:19 am to provider [facility] , who verbally acknowledged  these results. Electronically Signed   By: Lucienne Capers M.D.   On: 08/22/2021 01:29   DG Chest 1 View  Result Date: 08/21/2021 CLINICAL DATA:  Chest pain, back pain EXAM: CHEST  1 VIEW COMPARISON:  05/13/2020 FINDINGS: Lungs volumes are small and there is left basilar atelectasis. No pneumothorax or pleural effusion. Cardiac size within normal limits. Pulmonary vascularity is normal. Osseous structures are age-appropriate. No acute bone abnormality. IMPRESSION: Pulmonary hypoinflation. Electronically Signed   By: Fidela Salisbury M.D.   On: 08/21/2021 21:57     Subjective: No acute issues or events overnight  denies nausea vomiting diarrhea headache fevers chills or chest pain.  Constipation improving with regimen   Discharge Exam: Vitals:   08/25/21 2026 08/26/21 0432  BP: 120/77 120/75  Pulse: (!) 104 100  Resp: 16 16  Temp: 98.5 F (36.9 C) 98.2 F (36.8 C)  SpO2: 94% 92%   Vitals:   08/25/21 0930 08/25/21 0931 08/25/21 2026 08/26/21 0432  BP: 119/85 119/85 120/77 120/75  Pulse:  (!) 104 (!) 104 100  Resp: 20 18 16 16   Temp: 98.9 F (37.2 C) 98.9 F (37.2 C) 98.5 F (36.9 C) 98.2 F (36.8 C)  TempSrc: Oral  Oral Oral  SpO2: 96% 97% 94% 92%  Weight:      Height:        General: Pt is alert, awake, not in acute distress Cardiovascular: RRR, S1/S2 +, no rubs, no gallops Respiratory: CTA bilaterally, no wheezing, no rhonchi Abdominal: Soft, NT, ND, bowel sounds + Extremities: no edema, no cyanosis    The results of significant diagnostics from this hospitalization (including imaging, microbiology, ancillary and laboratory) are listed below for reference.     Microbiology: No results found for this or any previous visit (from the past 240 hour(s)).   Labs: BNP (last 3 results) Recent Labs    08/21/21 2137  BNP 66.4   Basic Metabolic Panel: Recent Labs  Lab 08/21/21 2137 08/22/21 0325  NA 138 136  K 4.1 4.0  CL 102 103  CO2 22 22  GLUCOSE 123* 100*   BUN 12 12  CREATININE 0.75 0.67  CALCIUM 9.4 9.0  MG  --  2.0   Liver Function Tests: Recent Labs  Lab 08/22/21 0325  AST 33  ALT 27  ALKPHOS 219*  BILITOT 0.4  PROT 6.8  ALBUMIN 2.6*   No results for input(s): "LIPASE", "AMYLASE" in the last 168 hours. No results for input(s): "AMMONIA" in the last 168 hours. CBC: Recent Labs  Lab 08/21/21 2137 08/22/21 0325 08/23/21 0149 08/24/21 0058 08/25/21 0033  WBC 17.0* 17.3* 16.8* 16.1* 15.9*  NEUTROABS  --  12.4*  --   --   --   HGB 10.5* 9.2* 9.6* 9.7* 10.0*  HCT 34.0* 29.8* 30.6* 31.5* 32.2*  MCV 77.4* 78.2* 77.7* 77.6* 77.8*  PLT 384 357 366 319 409*   Cardiac Enzymes: No results for input(s): "CKTOTAL", "CKMB", "CKMBINDEX", "TROPONINI" in the last 168 hours. BNP: Invalid input(s): "POCBNP" CBG: No results for input(s): "GLUCAP" in the last 168 hours. D-Dimer No results for input(s): "DDIMER" in the last 72 hours. Hgb A1c No results for input(s): "HGBA1C" in the last 72 hours. Lipid Profile No results for input(s): "CHOL", "HDL", "LDLCALC", "TRIG", "CHOLHDL", "LDLDIRECT" in the last 72 hours. Thyroid function studies No results for input(s): "TSH", "T4TOTAL", "T3FREE", "THYROIDAB" in the last 72 hours.  Invalid input(s): "FREET3" Anemia work up No results for input(s): "VITAMINB12", "FOLATE", "FERRITIN", "TIBC", "IRON", "RETICCTPCT" in the last 72 hours. Urinalysis    Component Value Date/Time   COLORURINE YELLOW 02/14/2010 0600   APPEARANCEUR CLEAR 02/14/2010 0600   LABSPEC 1.028 02/14/2010 0600   PHURINE 5.5 02/14/2010 0600   GLUCOSEU NEGATIVE 02/14/2010 0600   HGBUR NEGATIVE 02/14/2010 0600   BILIRUBINUR LARGE (A) 02/14/2010 0600   KETONESUR NEGATIVE 02/14/2010 0600   PROTEINUR NEGATIVE 02/14/2010 0600   UROBILINOGEN 1.0 02/14/2010 0600   NITRITE NEGATIVE 02/14/2010 0600   LEUKOCYTESUR  02/14/2010 0600    NEGATIVE MICROSCOPIC NOT DONE ON URINES WITH NEGATIVE PROTEIN, BLOOD, LEUKOCYTES, NITRITE, OR  GLUCOSE <1000 mg/dL.   Sepsis Labs Recent Labs  Lab 08/22/21 0325 08/23/21 0149 08/24/21 0058 08/25/21 0033  WBC 17.3* 16.8* 16.1* 15.9*   Microbiology No results found for this or any previous visit (from the past 240 hour(s)).   Time coordinating discharge: Over 30 minutes  SIGNED:   [name], DO [facility] 08/26/2021, 7:53 PM Pager   If 7PM-7AM, please contact night-coverage [email or web address]

## 2021-08-25 NOTE — Plan of Care (Signed)

## 2021-08-25 NOTE — Progress Notes (Signed)
  Mobility Specialist Criteria Algorithm Info.   08/25/21 1045  Pain Assessment  Pain Assessment 0-10  Pain Score 8  Pain Location back & chest  Pain Descriptors / Indicators Discomfort;Grimacing;Sharp;Shooting  Pain Intervention(s) Limited activity within patient's tolerance;Premedicated before session;Patient requesting pain meds-RN notified  Mobility  Activity Ambulated with assistance in hallway  Range of Motion/Exercises Active;All extremities  Level of Assistance Contact guard assist, steadying assist  Assistive Device Front wheel walker  Distance Ambulated (ft) 30 ft  Activity Response Tolerated fair;RN notified   Patient received in supine agreeable to participate in mobility despite pain. Complained of 8/10 chest and back pain, described as sharp or shooting that gets worst with deep inspirations. Was independent to EOB from supine>sit but needed extra time to do so. Required min A sit>stand + cues for hand placement. Ambulated short distance min guard. Had one occurrence of unsteadiness but denied dizziness or lightheadedness. Was unable to obtain accurate SpO2 reading as oximeter pleth was weak and inconsistent.  Participation and distance limited by pain, weakness and fatigue. Returned to room without incident. Was left in bed with all needs met, call bell in reach.  08/25/2021 10:45 AM  [place] Debie Ashline, [place], [place]  RNHAF:[phone] Office: [phone]

## 2021-08-26 DIAGNOSIS — I2699 Other pulmonary embolism without acute cor pulmonale: Secondary | ICD-10-CM | POA: Diagnosis not present

## 2021-08-26 MED ORDER — SENNOSIDES-DOCUSATE SODIUM 8.6-50 MG PO TABS: 1.0000 | ORAL_TABLET | Freq: Two times a day (BID) | ORAL | 0 refills | Status: DC

## 2021-08-26 MED ORDER — APIXABAN 5 MG PO TABS: 5.0000 mg | ORAL_TABLET | Freq: Two times a day (BID) | ORAL | 2 refills | Status: AC

## 2021-08-26 MED ORDER — POLYETHYLENE GLYCOL 3350 17 G PO PACK: 17.0000 g | PACK | Freq: Two times a day (BID) | ORAL | 0 refills | Status: AC

## 2021-08-26 MED ORDER — APIXABAN 5 MG PO TABS: 10.0000 mg | ORAL_TABLET | Freq: Two times a day (BID) | ORAL | 0 refills | Status: AC

## 2021-08-26 MED ORDER — ACETAMINOPHEN 325 MG PO TABS: 650.0000 mg | ORAL_TABLET | Freq: Four times a day (QID) | ORAL | 0 refills | Status: DC | PRN

## 2021-08-26 MED ORDER — OXYCODONE HCL ER 20 MG PO T12A: 20.0000 mg | EXTENDED_RELEASE_TABLET | Freq: Two times a day (BID) | ORAL | 0 refills | Status: DC

## 2021-08-26 MED ORDER — ACETAMINOPHEN 325 MG PO TABS: 650.0000 mg | ORAL_TABLET | Freq: Four times a day (QID) | ORAL | 0 refills | Status: AC | PRN

## 2021-08-26 MED ORDER — APIXABAN 5 MG PO TABS: 5.0000 mg | ORAL_TABLET | Freq: Two times a day (BID) | ORAL | 2 refills | Status: DC

## 2021-08-26 MED ORDER — SENNOSIDES-DOCUSATE SODIUM 8.6-50 MG PO TABS: 1.0000 | ORAL_TABLET | Freq: Two times a day (BID) | ORAL | 0 refills | Status: AC

## 2021-08-26 MED ORDER — APIXABAN 5 MG PO TABS: 10.0000 mg | ORAL_TABLET | Freq: Two times a day (BID) | ORAL | 0 refills | Status: DC

## 2021-08-26 MED ORDER — OXYCODONE-ACETAMINOPHEN 5-325 MG PO TABS: 1.0000 | ORAL_TABLET | ORAL | 0 refills | Status: DC | PRN

## 2021-08-26 MED ORDER — POLYETHYLENE GLYCOL 3350 17 G PO PACK: 17.0000 g | PACK | Freq: Two times a day (BID) | ORAL | 0 refills | Status: DC

## 2021-08-26 NOTE — Plan of Care (Signed)

## 2021-08-26 NOTE — Progress Notes (Signed)
SATURATION QUALIFICATIONS:   Patient Saturations on Room Air at Rest = 97%  Patient Saturations on Room Air while Ambulating = 79%  Patient Saturations on 2 Liters of oxygen while Ambulating = 99%

## 2021-08-26 NOTE — TOC Transition Note (Signed)
Transition of Care [facility]) - CM/SW Discharge Note   Patient Details  Name: [name] MRN: [id number] Date of Birth: February 23, 1966  [facility] CM/SW Contact:  Bartholomew Crews, RN Phone Number: [phone] 08/26/2021, 3:48 PM   Clinical Narrative:     Spoke with patient at the bedside to discuss post acute transition. Patient agreeable to home oxygen. Referral to [employer] who will bring tank to hospital and do home set up once patient is home.   Final next level of care: Home/Self Care Barriers to Discharge: No Barriers Identified   Patient Goals and CMS Choice        Discharge Placement                       Discharge Plan and Services                DME Arranged: Oxygen DME Agency: [employer] Date DME Agency Contacted: 08/26/21 Time DME Agency Contacted: [phone] Representative spoke with at DME Agency: [place]            Social Determinants of Health ([place]) Interventions     Readmission Risk Interventions     No data to display

## 2021-08-26 NOTE — Progress Notes (Signed)
Patients medications were sent to a pharmacy that was closed at discharged, so family found a 24 hour pharmacy ([facility] [address] On call MD notified and reordered non narcotic medications. MD was unable to order narcotics due to not being set up. RN spoke with patients sister which this was explained. MD stated that patient can pick up meds that were sent and tomorrow she would have to go to original pharmacy to pick up remaining meds or try and speak with MD Avon Gully in the morning.

## 2021-08-26 NOTE — Progress Notes (Signed)
Pt assisted to bed, soap sud enema done per order. Pt tolerated well.

## 2021-08-26 NOTE — Progress Notes (Signed)
Mobility Specialist Progress Note   08/26/21 1100  Mobility  HOB Elevated/Bed Position Self regulated  Activity Ambulated with assistance in hallway  Range of Motion/Exercises Active;All extremities  Level of Assistance +2 (takes two people)  Geographical information systems officer (Chair follow to progress ambulation)  Distance Ambulated (ft) 132 ft  Activity Response Tolerated well  Transport method Ambulatory  $Mobility charge 1 Mobility   Pre Mobility: 97% SpO2 on 2LO2 During Mobility: 79 -85% SpO2 Post Mobility: 99% SpO2 on 2LO2  Received pt in door way doing an ambulatory sat qualification test w/ RN's. Assisted during ambulation w/ a chair follow and O2 checks throughout. Pt claiming no pain but becoming increasingly SOB as ambulation progressed, SpO2 ranged from 79% - 85% on RA. 2LO2 provide per advisement of RN and pt remained >98%SpO2 for rest of session. X1 seated rest break d/t fatigue, pt rolled back to there room w/o any faults. Left in room w/ RN's   Holland Falling Mobility Specialist [place] #:  [phone] Acute Rehab Office:  [phone]

## 2021-08-26 NOTE — Progress Notes (Signed)
Provided discharge education/instructions, all questions and concerns addressed. Pt not in acute distress, O2 tank delivered to room, discharged home with belongings accompanied by family.

## 2021-08-26 NOTE — Discharge Instructions (Signed)
Information on my medicine - ELIQUIS (apixaban)  This medication education was reviewed with me or my healthcare representative as part of my discharge preparation.    Why was Eliquis prescribed for you? Eliquis was prescribed to treat blood clots that may have been found in the veins of your legs (deep vein thrombosis) or in your lungs (pulmonary embolism) and to reduce the risk of them occurring again.  What do You need to know about Eliquis ? The starting dose is 10 mg (two 5 mg tablets) taken TWICE daily for the FIRST SEVEN (7) DAYS, then on (enter date)  09/01/21  the dose is reduced to ONE 5 mg tablet taken TWICE daily.  Eliquis may be taken with or without food.   Try to take the dose about the same time in the morning and in the evening. If you have difficulty swallowing the tablet whole please discuss with your pharmacist how to take the medication safely.  Take Eliquis exactly as prescribed and DO NOT stop taking Eliquis without talking to the doctor who prescribed the medication.  Stopping may increase your risk of developing a new blood clot.  Refill your prescription before you run out.  After discharge, you should have regular check-up appointments with your healthcare provider that is prescribing your Eliquis.    What do you do if you miss a dose? If a dose of ELIQUIS is not taken at the scheduled time, take it as soon as possible on the same day and twice-daily administration should be resumed. The dose should not be doubled to make up for a missed dose.  Important Safety Information A possible side effect of Eliquis is bleeding. You should call your healthcare provider right away if you experience any of the following: Bleeding from an injury or your nose that does not stop. Unusual colored urine (red or dark brown) or unusual colored stools (red or black). Unusual bruising for unknown reasons. A serious fall or if you hit your head (even if there is no  bleeding).  Some medicines may interact with Eliquis and might increase your risk of bleeding or clotting while on Eliquis. To help avoid this, consult your healthcare provider or pharmacist prior to using any new prescription or non-prescription medications, including herbals, vitamins, non-steroidal anti-inflammatory drugs (NSAIDs) and supplements.  This website has more information on Eliquis (apixaban): http://www.eliquis.com/eliquis/home

## 2021-08-27 ENCOUNTER — Other Ambulatory Visit: Payer: Self-pay | Admitting: Internal Medicine

## 2021-08-27 DIAGNOSIS — I2699 Other pulmonary embolism without acute cor pulmonale: Secondary | ICD-10-CM

## 2021-08-27 MED ORDER — OXYCODONE HCL ER 20 MG PO T12A: 20.0000 mg | EXTENDED_RELEASE_TABLET | Freq: Two times a day (BID) | ORAL | 0 refills | Status: DC

## 2021-08-27 MED ORDER — OXYCODONE-ACETAMINOPHEN 5-325 MG PO TABS: 1.0000 | ORAL_TABLET | ORAL | 0 refills | Status: DC | PRN

## 2021-08-27 NOTE — Progress Notes (Unsigned)
{  Select_TRH_Note:26780} 

## 2021-08-27 NOTE — Progress Notes (Signed)
Contacted by staff that patient has been unable to obtain her narcotic prescriptions sent yesterday afternoon due to pharmacy closure today on 4th of July.  As such we will send 1 days worth of both Percocet and OxyContin as per previous prescription to hold her over until July 5 at which time her normal pharmacy will open and she will be able to obtain her previous prescription.

## 2021-08-28 ENCOUNTER — Telehealth: Payer: Self-pay | Admitting: Internal Medicine

## 2021-08-28 ENCOUNTER — Other Ambulatory Visit: Payer: Self-pay | Admitting: Internal Medicine

## 2021-08-28 ENCOUNTER — Encounter: Payer: Self-pay | Admitting: *Deleted

## 2021-08-28 MED ORDER — OXYCODONE-ACETAMINOPHEN 5-325 MG PO TABS: 1.0000 | ORAL_TABLET | ORAL | 0 refills | Status: AC | PRN

## 2021-08-28 MED ORDER — OXYCODONE HCL ER 20 MG PO T12A: 20.0000 mg | EXTENDED_RELEASE_TABLET | Freq: Two times a day (BID) | ORAL | 0 refills | Status: AC

## 2021-08-28 NOTE — Telephone Encounter (Signed)
Scheduled appt per 6/29 referral. Pt's sister is aware of appt date and time. Pt's sister is aware to arrive 15 mins prior to appt time and to bring and updated insurance card. Pt's sister is aware of appt location.

## 2021-08-28 NOTE — Progress Notes (Signed)
Patient discharged from [facility] and new patient referral was received for metastatic lung cancer.  In reviewing patient's chart I saw that she has a scheduled medical oncology appointment at the [facility] with Dr. Baird Cancer.  I called patient and informed her that we had received the referral and inquired whether she plans to seek care there.  Patient informed me that she prefers to receive care at [facility] since it is closer to home.  Information sent to New Patient Scheduler to schedule patient.

## 2021-09-03 ENCOUNTER — Encounter (HOSPITAL_COMMUNITY): Payer: Self-pay

## 2021-09-03 ENCOUNTER — Emergency Department (HOSPITAL_COMMUNITY): Payer: BLUE CROSS/BLUE SHIELD

## 2021-09-03 ENCOUNTER — Other Ambulatory Visit: Payer: Self-pay

## 2021-09-03 ENCOUNTER — Inpatient Hospital Stay (HOSPITAL_COMMUNITY)
Admission: EM | Admit: 2021-09-03 | Discharge: 2021-10-25 | DRG: 939 | Disposition: E | Payer: BLUE CROSS/BLUE SHIELD | Attending: Internal Medicine | Admitting: Internal Medicine

## 2021-09-03 DIAGNOSIS — G893 Neoplasm related pain (acute) (chronic): Secondary | ICD-10-CM | POA: Diagnosis not present

## 2021-09-03 DIAGNOSIS — C799 Secondary malignant neoplasm of unspecified site: Secondary | ICD-10-CM | POA: Diagnosis present

## 2021-09-03 DIAGNOSIS — K2211 Ulcer of esophagus with bleeding: Secondary | ICD-10-CM | POA: Diagnosis present

## 2021-09-03 DIAGNOSIS — E872 Acidosis, unspecified: Secondary | ICD-10-CM

## 2021-09-03 DIAGNOSIS — R0902 Hypoxemia: Secondary | ICD-10-CM | POA: Diagnosis present

## 2021-09-03 DIAGNOSIS — C7901 Secondary malignant neoplasm of right kidney and renal pelvis: Secondary | ICD-10-CM | POA: Diagnosis present

## 2021-09-03 DIAGNOSIS — R071 Chest pain on breathing: Secondary | ICD-10-CM | POA: Diagnosis not present

## 2021-09-03 DIAGNOSIS — Z79899 Other long term (current) drug therapy: Secondary | ICD-10-CM

## 2021-09-03 DIAGNOSIS — L8990 Pressure ulcer of unspecified site, unspecified stage: Secondary | ICD-10-CM | POA: Insufficient documentation

## 2021-09-03 DIAGNOSIS — D509 Iron deficiency anemia, unspecified: Secondary | ICD-10-CM | POA: Diagnosis present

## 2021-09-03 DIAGNOSIS — R52 Pain, unspecified: Secondary | ICD-10-CM | POA: Diagnosis present

## 2021-09-03 DIAGNOSIS — Z880 Allergy status to penicillin: Secondary | ICD-10-CM

## 2021-09-03 DIAGNOSIS — Z7901 Long term (current) use of anticoagulants: Secondary | ICD-10-CM

## 2021-09-03 DIAGNOSIS — R000 Tachycardia, unspecified: Secondary | ICD-10-CM

## 2021-09-03 DIAGNOSIS — Z86718 Personal history of other venous thrombosis and embolism: Secondary | ICD-10-CM

## 2021-09-03 DIAGNOSIS — C7951 Secondary malignant neoplasm of bone: Secondary | ICD-10-CM | POA: Diagnosis present

## 2021-09-03 DIAGNOSIS — Z515 Encounter for palliative care: Secondary | ICD-10-CM

## 2021-09-03 DIAGNOSIS — I2699 Other pulmonary embolism without acute cor pulmonale: Secondary | ICD-10-CM | POA: Diagnosis present

## 2021-09-03 DIAGNOSIS — Z90710 Acquired absence of both cervix and uterus: Secondary | ICD-10-CM

## 2021-09-03 DIAGNOSIS — Z888 Allergy status to other drugs, medicaments and biological substances status: Secondary | ICD-10-CM

## 2021-09-03 DIAGNOSIS — C3411 Malignant neoplasm of upper lobe, right bronchus or lung: Secondary | ICD-10-CM | POA: Diagnosis present

## 2021-09-03 DIAGNOSIS — R0789 Other chest pain: Secondary | ICD-10-CM | POA: Diagnosis present

## 2021-09-03 DIAGNOSIS — J9811 Atelectasis: Secondary | ICD-10-CM | POA: Diagnosis present

## 2021-09-03 DIAGNOSIS — R944 Abnormal results of kidney function studies: Secondary | ICD-10-CM | POA: Diagnosis present

## 2021-09-03 DIAGNOSIS — Z902 Acquired absence of lung [part of]: Secondary | ICD-10-CM

## 2021-09-03 DIAGNOSIS — Z9079 Acquired absence of other genital organ(s): Secondary | ICD-10-CM

## 2021-09-03 DIAGNOSIS — D638 Anemia in other chronic diseases classified elsewhere: Secondary | ICD-10-CM

## 2021-09-03 DIAGNOSIS — Z8249 Family history of ischemic heart disease and other diseases of the circulatory system: Secondary | ICD-10-CM

## 2021-09-03 DIAGNOSIS — Z90722 Acquired absence of ovaries, bilateral: Secondary | ICD-10-CM

## 2021-09-03 DIAGNOSIS — C7931 Secondary malignant neoplasm of brain: Secondary | ICD-10-CM

## 2021-09-03 DIAGNOSIS — K644 Residual hemorrhoidal skin tags: Secondary | ICD-10-CM | POA: Diagnosis present

## 2021-09-03 DIAGNOSIS — H6122 Impacted cerumen, left ear: Secondary | ICD-10-CM | POA: Diagnosis present

## 2021-09-03 DIAGNOSIS — R0781 Pleurodynia: Secondary | ICD-10-CM

## 2021-09-03 DIAGNOSIS — Z9981 Dependence on supplemental oxygen: Secondary | ICD-10-CM

## 2021-09-03 DIAGNOSIS — L89312 Pressure ulcer of right buttock, stage 2: Secondary | ICD-10-CM | POA: Diagnosis not present

## 2021-09-03 DIAGNOSIS — C7989 Secondary malignant neoplasm of other specified sites: Secondary | ICD-10-CM | POA: Diagnosis present

## 2021-09-03 DIAGNOSIS — M549 Dorsalgia, unspecified: Secondary | ICD-10-CM | POA: Diagnosis present

## 2021-09-03 DIAGNOSIS — K2961 Other gastritis with bleeding: Secondary | ICD-10-CM | POA: Diagnosis present

## 2021-09-03 DIAGNOSIS — K869 Disease of pancreas, unspecified: Secondary | ICD-10-CM | POA: Diagnosis present

## 2021-09-03 DIAGNOSIS — D72825 Bandemia: Secondary | ICD-10-CM

## 2021-09-03 DIAGNOSIS — Z91013 Allergy to seafood: Secondary | ICD-10-CM

## 2021-09-03 DIAGNOSIS — R591 Generalized enlarged lymph nodes: Secondary | ICD-10-CM | POA: Diagnosis present

## 2021-09-03 DIAGNOSIS — Z66 Do not resuscitate: Secondary | ICD-10-CM | POA: Diagnosis not present

## 2021-09-03 DIAGNOSIS — R079 Chest pain, unspecified: Secondary | ICD-10-CM

## 2021-09-03 DIAGNOSIS — Z803 Family history of malignant neoplasm of breast: Secondary | ICD-10-CM

## 2021-09-03 DIAGNOSIS — K2950 Unspecified chronic gastritis without bleeding: Secondary | ICD-10-CM | POA: Diagnosis present

## 2021-09-03 DIAGNOSIS — I3139 Other pericardial effusion (noninflammatory): Secondary | ICD-10-CM | POA: Diagnosis present

## 2021-09-03 DIAGNOSIS — Z9049 Acquired absence of other specified parts of digestive tract: Secondary | ICD-10-CM

## 2021-09-03 DIAGNOSIS — M1990 Unspecified osteoarthritis, unspecified site: Secondary | ICD-10-CM | POA: Diagnosis present

## 2021-09-03 DIAGNOSIS — C7902 Secondary malignant neoplasm of left kidney and renal pelvis: Secondary | ICD-10-CM | POA: Diagnosis present

## 2021-09-03 DIAGNOSIS — Z887 Allergy status to serum and vaccine status: Secondary | ICD-10-CM

## 2021-09-03 DIAGNOSIS — C787 Secondary malignant neoplasm of liver and intrahepatic bile duct: Secondary | ICD-10-CM | POA: Diagnosis present

## 2021-09-03 DIAGNOSIS — C7802 Secondary malignant neoplasm of left lung: Secondary | ICD-10-CM | POA: Diagnosis present

## 2021-09-03 DIAGNOSIS — Z86711 Personal history of pulmonary embolism: Secondary | ICD-10-CM

## 2021-09-03 LAB — CBC
HCT: 30.5 % — ABNORMAL LOW (ref 36.0–46.0)
Hemoglobin: 9.6 g/dL — ABNORMAL LOW (ref 12.0–15.0)
MCH: 24.2 pg — ABNORMAL LOW (ref 26.0–34.0)
MCHC: 31.5 g/dL (ref 30.0–36.0)
MCV: 76.8 fL — ABNORMAL LOW (ref 80.0–100.0)
Platelets: 557 10*3/uL — ABNORMAL HIGH (ref 150–400)
RBC: 3.97 MIL/uL (ref 3.87–5.11)
RDW: 15.8 % — ABNORMAL HIGH (ref 11.5–15.5)
WBC: 17.3 10*3/uL — ABNORMAL HIGH (ref 4.0–10.5)
nRBC: 0 % (ref 0.0–0.2)

## 2021-09-03 LAB — DIFFERENTIAL
Abs Immature Granulocytes: 0.25 10*3/uL — ABNORMAL HIGH (ref 0.00–0.07)
Basophils Absolute: 0.1 10*3/uL (ref 0.0–0.1)
Basophils Relative: 1 %
Eosinophils Absolute: 0.2 10*3/uL (ref 0.0–0.5)
Eosinophils Relative: 1 %
Immature Granulocytes: 1 %
Lymphocytes Relative: 11 %
Lymphs Abs: 1.9 10*3/uL (ref 0.7–4.0)
Monocytes Absolute: 1.9 10*3/uL — ABNORMAL HIGH (ref 0.1–1.0)
Monocytes Relative: 11 %
Neutro Abs: 13 10*3/uL — ABNORMAL HIGH (ref 1.7–7.7)
Neutrophils Relative %: 75 %

## 2021-09-03 LAB — BASIC METABOLIC PANEL
Anion gap: 14 (ref 5–15)
BUN: 22 mg/dL — ABNORMAL HIGH (ref 6–20)
CO2: 24 mmol/L (ref 22–32)
Calcium: 9.1 mg/dL (ref 8.9–10.3)
Chloride: 98 mmol/L (ref 98–111)
Creatinine, Ser: 0.64 mg/dL (ref 0.44–1.00)
GFR, Estimated: >60 mL/min (ref >60)
Glucose, Bld: 148 mg/dL — ABNORMAL HIGH (ref 70–99)
Potassium: 5 mmol/L (ref 3.5–5.1)
Sodium: 136 mmol/L (ref 135–145)

## 2021-09-03 LAB — TROPONIN I (HIGH SENSITIVITY): Troponin I (High Sensitivity): 9 ng/L (ref <18)

## 2021-09-03 MED ORDER — ONDANSETRON HCL 4 MG/2ML IJ SOLN
4.0000 mg | Freq: Once | INTRAMUSCULAR | Status: AC
  Administered 2021-09-03: 4 mg via INTRAVENOUS
  Filled 2021-09-03: qty 2

## 2021-09-03 MED ORDER — MORPHINE SULFATE (PF) 4 MG/ML IV SOLN
4.0000 mg | Freq: Once | INTRAVENOUS | Status: AC
  Administered 2021-09-03: 4 mg via INTRAVENOUS
  Filled 2021-09-03: qty 1

## 2021-09-03 NOTE — ED Provider Notes (Signed)
[facility] DEPT Provider Note   CSN: [id number] Arrival date & time: 09/05/2021  2126     History {Add pertinent medical, surgical, social history, OB history to HPI:1} Chief Complaint  Patient presents with   Chest Pain    SHAQUAVIA WHISONANT is a 56 y.o. female.   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  States this feels very similar to her pulmonary embolism on 08/21/2021.  Chest pain and shortness of breath started today, though the pain is to the left side of her chest and is pleuritic.  Denies any cough, fevers.  She is having lower extremity swelling which is been present since admission on the 28th.  No missed dose of the Eliquis although she has not had her evening dose yet today.  She is followed by Dr. Earlie Server with oncology.  Home Medications Prior to Admission medications   Medication Sig Start Date End Date Taking? Authorizing Provider  acetaminophen (TYLENOL) 325 MG tablet Take 2 tablets (650 mg total) by mouth every 6 (six) hours as needed for mild pain (or Fever >/= 101). 08/26/21   Shalhoub, Sherryll Burger, MD  apixaban (ELIQUIS) 5 MG TABS tablet Take 2 tablets (10 mg total) by mouth 2 (two) times daily for 5 days. 08/26/21 08/31/21  Shalhoub, Sherryll Burger, MD  apixaban (ELIQUIS) 5 MG TABS tablet Take 1 tablet (5 mg total) by mouth 2 (two) times daily. 09/01/21   Shalhoub, Sherryll Burger, MD  baclofen (LIORESAL) 10 MG tablet Take 10 mg by mouth 3 (three) times daily. 08/04/21   [provider]  famotidine (PEPCID) 20 MG tablet Take 20 mg by mouth 2 (two) times daily. 07/02/21   [provider]  gabapentin (NEURONTIN) 100 MG capsule Take 200 mg by mouth 2 (two) times daily. 08/05/21   [provider]  polyethylene glycol (MIRALAX / GLYCOLAX) 17 g packet Take 17 g by  mouth 2 (two) times daily. 08/26/21   Shalhoub, Sherryll Burger, MD  senna-docusate (SENOKOT-S) 8.6-50 MG tablet Take 1 tablet by mouth 2 (two) times daily. 08/26/21   Shalhoub, Sherryll Burger, MD  traZODone (DESYREL) 50 MG tablet Take 100 mg by mouth at bedtime. 08/03/21   [provider]      Allergies    Fish allergy, Penicillins, Augmentin [amoxicillin-pot clavulanate], Betadine [povidone iodine], and Tetanus toxoids    Review of Systems   Review of Systems  Cardiovascular:  Positive for chest pain.    Physical Exam Updated Vital Signs BP (!) 142/73 (BP Location: Left Arm)   Pulse (!) 130   Temp 98.8 F (37.1 C) (Oral)   Resp 20   Ht 5\' 9"  (1.753 m)   Wt 83.9 kg   SpO2 95%   BMI 27.32 kg/m  Physical Exam Vitals and nursing note reviewed. Exam conducted with a chaperone present.  Constitutional:      Appearance: Normal appearance. She is ill-appearing.  HENT:     Head: Normocephalic and atraumatic.  Eyes:     General: No scleral icterus.       Right eye: No discharge.        Left eye: No discharge.     Extraocular Movements: Extraocular movements intact.     Pupils: Pupils are equal, round, and reactive to light.  Cardiovascular:     Rate and  Rhythm: Regular rhythm. Tachycardia present.     Pulses: Normal pulses.     Heart sounds: Normal heart sounds. No murmur heard.    No friction rub. No gallop.  Pulmonary:     Effort: Pulmonary effort is normal. Tachypnea present. No respiratory distress.     Breath sounds: Decreased breath sounds present.  Chest:     Chest wall: No tenderness.  Abdominal:     General: Abdomen is flat. Bowel sounds are normal. There is no distension.     Palpations: Abdomen is soft.     Tenderness: There is no abdominal tenderness.  Musculoskeletal:     Right lower leg: Edema present.     Left lower leg: Edema present.  Skin:    General: Skin is warm and dry.     Coloration: Skin is not jaundiced.  Neurological:     Mental Status: She is alert.  Mental status is at baseline.     Coordination: Coordination normal.     ED Results / Procedures / Treatments   Labs (all labs ordered are listed, but only abnormal results are displayed) Labs Reviewed  BASIC METABOLIC PANEL  CBC  DIFFERENTIAL  BRAIN NATRIURETIC PEPTIDE  I-STAT BETA HCG BLOOD, ED ([facility], [facility], [facility] ONLY)  TROPONIN I (HIGH SENSITIVITY)    EKG None  Radiology No results found.  Procedures Procedures  {Document cardiac monitor, telemetry assessment procedure when appropriate:1}  Medications Ordered in ED Medications - No data to display  ED Course/ Medical Decision Making/ A&P Clinical Course as of 08/26/2021 2355  Tue Sep 03, 2021  2324 Tachycardia, pain, inability to perform ADLs secondary to pain.  [HS]    Clinical Course User Index [HS] Sherrill Raring, PA-C                           Medical Decision Making Amount and/or Complexity of Data Reviewed Labs: ordered. Radiology: ordered.  Risk Prescription drug management. Decision regarding hospitalization.   Patient presents due to chest pain shortness of breath.  She is anticoagulated on Xarelto.  Differential includes but not limited to CHF, ACS, pneumonia, worsening chronic diseases.  On exam she is persistently tachycardic, not hypoxic at rest.  She is unable to ambulate or do ADLs at home, she is ill-appearing.  There are decreased lung sounds, rate is regular although tachycardic.  I viewed external records as documented in HPI.  Patient has metastatic cancer, also anticoagulated due to multiple subsegmental PEs on Xarelto.  Echocardiogram obtained a week ago was without signs of heart failure.  I ordered and reviewed laboratory work-up.  Patient has leukocytosis of 17.3 with left shift.  She is anemic with a hemoglobin of 9.6 with this appears to be roughly baseline.  Also thrombocytosis at 557.  Troponin 9, BMP is without gross electrolyte derangement or AKI.  Chest x-ray does not show any acute  process, agree radiology interpretation.  EKG shows tachycardia, no specific ischemic findings.  Patient is on cardiac monitoring and has been tachycardic throughout the ED stay.  I considered discharge with outpatient follow-up but patient is not able to perform ADLs secondary to pain in her chest.  She is also tachycardic, not ambulating secondary to pain.  I did put in a consult for palliative care but they were unable to to see the patient tonight as they leave after 5 PM.  I will consult hospitalist as I think patient would benefit due to compensation from her  chronic disease, tachycardia and inability to perform ADLs due to pain.  Discussed HPI, physical exam and plan of care for this patient with attending Ankit Nanavati.. The attending physician evaluated this patient as part of a shared visit and agrees with plan of care.     {Document critical care time when appropriate:1} {Document review of labs and clinical decision tools ie heart score, Chads2Vasc2 etc:1}  {Document your independent review of radiology images, and any outside records:1} {Document your discussion with family members, caretakers, and with consultants:1} {Document social determinants of health affecting pt's care:1} {Document your decision making why or why not admission, treatments were needed:1} Final Clinical Impression(s) / ED Diagnoses Final diagnoses:  None    Rx / DC Orders ED Discharge Orders     None

## 2021-09-03 NOTE — ED Triage Notes (Signed)
Patient was recently released last Wednesday with the same chest pain that she is having tonight. Pain radiates to her lower back. Diagnosed with pulmonary embolism last week. Currently has lung cancer stage 4. Has not started treatment yet.

## 2021-09-04 ENCOUNTER — Inpatient Hospital Stay (HOSPITAL_COMMUNITY): Payer: BLUE CROSS/BLUE SHIELD

## 2021-09-04 ENCOUNTER — Encounter (HOSPITAL_COMMUNITY): Payer: Self-pay | Admitting: Family Medicine

## 2021-09-04 DIAGNOSIS — R079 Chest pain, unspecified: Secondary | ICD-10-CM | POA: Diagnosis present

## 2021-09-04 DIAGNOSIS — Z66 Do not resuscitate: Secondary | ICD-10-CM | POA: Diagnosis not present

## 2021-09-04 DIAGNOSIS — Z515 Encounter for palliative care: Secondary | ICD-10-CM | POA: Diagnosis not present

## 2021-09-04 DIAGNOSIS — C7800 Secondary malignant neoplasm of unspecified lung: Secondary | ICD-10-CM | POA: Diagnosis not present

## 2021-09-04 DIAGNOSIS — D638 Anemia in other chronic diseases classified elsewhere: Secondary | ICD-10-CM | POA: Diagnosis present

## 2021-09-04 DIAGNOSIS — Z86711 Personal history of pulmonary embolism: Secondary | ICD-10-CM | POA: Diagnosis not present

## 2021-09-04 DIAGNOSIS — R609 Edema, unspecified: Secondary | ICD-10-CM | POA: Diagnosis not present

## 2021-09-04 DIAGNOSIS — R0789 Other chest pain: Secondary | ICD-10-CM | POA: Diagnosis present

## 2021-09-04 DIAGNOSIS — J9811 Atelectasis: Secondary | ICD-10-CM | POA: Diagnosis present

## 2021-09-04 DIAGNOSIS — C7902 Secondary malignant neoplasm of left kidney and renal pelvis: Secondary | ICD-10-CM | POA: Diagnosis present

## 2021-09-04 DIAGNOSIS — Z902 Acquired absence of lung [part of]: Secondary | ICD-10-CM | POA: Diagnosis not present

## 2021-09-04 DIAGNOSIS — R52 Pain, unspecified: Secondary | ICD-10-CM | POA: Diagnosis present

## 2021-09-04 DIAGNOSIS — R000 Tachycardia, unspecified: Secondary | ICD-10-CM | POA: Diagnosis present

## 2021-09-04 DIAGNOSIS — K5903 Drug induced constipation: Secondary | ICD-10-CM | POA: Diagnosis not present

## 2021-09-04 DIAGNOSIS — Z86718 Personal history of other venous thrombosis and embolism: Secondary | ICD-10-CM | POA: Diagnosis not present

## 2021-09-04 DIAGNOSIS — G893 Neoplasm related pain (acute) (chronic): Secondary | ICD-10-CM | POA: Diagnosis present

## 2021-09-04 DIAGNOSIS — D72825 Bandemia: Secondary | ICD-10-CM | POA: Diagnosis present

## 2021-09-04 DIAGNOSIS — K2961 Other gastritis with bleeding: Secondary | ICD-10-CM | POA: Diagnosis present

## 2021-09-04 DIAGNOSIS — E872 Acidosis, unspecified: Secondary | ICD-10-CM | POA: Diagnosis present

## 2021-09-04 DIAGNOSIS — R071 Chest pain on breathing: Secondary | ICD-10-CM | POA: Diagnosis not present

## 2021-09-04 DIAGNOSIS — C7901 Secondary malignant neoplasm of right kidney and renal pelvis: Secondary | ICD-10-CM | POA: Diagnosis present

## 2021-09-04 DIAGNOSIS — C3411 Malignant neoplasm of upper lobe, right bronchus or lung: Secondary | ICD-10-CM | POA: Diagnosis present

## 2021-09-04 DIAGNOSIS — K2211 Ulcer of esophagus with bleeding: Secondary | ICD-10-CM | POA: Diagnosis present

## 2021-09-04 DIAGNOSIS — C7989 Secondary malignant neoplasm of other specified sites: Secondary | ICD-10-CM | POA: Diagnosis present

## 2021-09-04 DIAGNOSIS — K2950 Unspecified chronic gastritis without bleeding: Secondary | ICD-10-CM | POA: Diagnosis present

## 2021-09-04 DIAGNOSIS — C7951 Secondary malignant neoplasm of bone: Secondary | ICD-10-CM | POA: Diagnosis present

## 2021-09-04 DIAGNOSIS — C7802 Secondary malignant neoplasm of left lung: Secondary | ICD-10-CM | POA: Diagnosis present

## 2021-09-04 DIAGNOSIS — L89312 Pressure ulcer of right buttock, stage 2: Secondary | ICD-10-CM | POA: Diagnosis not present

## 2021-09-04 DIAGNOSIS — I3139 Other pericardial effusion (noninflammatory): Secondary | ICD-10-CM | POA: Diagnosis present

## 2021-09-04 DIAGNOSIS — C801 Malignant (primary) neoplasm, unspecified: Secondary | ICD-10-CM | POA: Diagnosis not present

## 2021-09-04 DIAGNOSIS — C787 Secondary malignant neoplasm of liver and intrahepatic bile duct: Secondary | ICD-10-CM | POA: Diagnosis present

## 2021-09-04 DIAGNOSIS — C799 Secondary malignant neoplasm of unspecified site: Secondary | ICD-10-CM | POA: Diagnosis present

## 2021-09-04 DIAGNOSIS — I2699 Other pulmonary embolism without acute cor pulmonale: Secondary | ICD-10-CM | POA: Diagnosis not present

## 2021-09-04 DIAGNOSIS — C7931 Secondary malignant neoplasm of brain: Secondary | ICD-10-CM | POA: Diagnosis present

## 2021-09-04 LAB — COMPREHENSIVE METABOLIC PANEL
ALT: 28 U/L (ref 0–44)
AST: 39 U/L (ref 15–41)
Albumin: 2.7 g/dL — ABNORMAL LOW (ref 3.5–5.0)
Alkaline Phosphatase: 391 U/L — ABNORMAL HIGH (ref 38–126)
Anion gap: 10 (ref 5–15)
BUN: 18 mg/dL (ref 6–20)
CO2: 28 mmol/L (ref 22–32)
Calcium: 9 mg/dL (ref 8.9–10.3)
Chloride: 99 mmol/L (ref 98–111)
Creatinine, Ser: 0.52 mg/dL (ref 0.44–1.00)
GFR, Estimated: >60 mL/min (ref >60)
Glucose, Bld: 106 mg/dL — ABNORMAL HIGH (ref 70–99)
Potassium: 4.3 mmol/L (ref 3.5–5.1)
Sodium: 137 mmol/L (ref 135–145)
Total Bilirubin: 0.6 mg/dL (ref 0.3–1.2)
Total Protein: 6.9 g/dL (ref 6.5–8.1)

## 2021-09-04 LAB — CBC
HCT: 28.7 % — ABNORMAL LOW (ref 36.0–46.0)
Hemoglobin: 8.9 g/dL — ABNORMAL LOW (ref 12.0–15.0)
MCH: 24.1 pg — ABNORMAL LOW (ref 26.0–34.0)
MCHC: 31 g/dL (ref 30.0–36.0)
MCV: 77.6 fL — ABNORMAL LOW (ref 80.0–100.0)
Platelets: 503 10*3/uL — ABNORMAL HIGH (ref 150–400)
RBC: 3.7 MIL/uL — ABNORMAL LOW (ref 3.87–5.11)
RDW: 15.6 % — ABNORMAL HIGH (ref 11.5–15.5)
WBC: 16.1 10*3/uL — ABNORMAL HIGH (ref 4.0–10.5)
nRBC: 0 % (ref 0.0–0.2)

## 2021-09-04 LAB — MAGNESIUM: Magnesium: 2.2 mg/dL (ref 1.7–2.4)

## 2021-09-04 LAB — HEPATIC FUNCTION PANEL
ALT: 36 U/L (ref 0–44)
AST: 53 U/L — ABNORMAL HIGH (ref 15–41)
Albumin: 2.9 g/dL — ABNORMAL LOW (ref 3.5–5.0)
Alkaline Phosphatase: 426 U/L — ABNORMAL HIGH (ref 38–126)
Bilirubin, Direct: 0.4 mg/dL — ABNORMAL HIGH (ref 0.0–0.2)
Indirect Bilirubin: 0.4 mg/dL (ref 0.3–0.9)
Total Bilirubin: 0.8 mg/dL (ref 0.3–1.2)
Total Protein: 7.6 g/dL (ref 6.5–8.1)

## 2021-09-04 LAB — HIV ANTIBODY (ROUTINE TESTING W REFLEX): HIV Screen 4th Generation wRfx: NONREACTIVE

## 2021-09-04 LAB — BRAIN NATRIURETIC PEPTIDE: B Natriuretic Peptide: 44.5 pg/mL (ref 0.0–100.0)

## 2021-09-04 MED ORDER — SODIUM CHLORIDE 0.9% FLUSH
3.0000 mL | Freq: Two times a day (BID) | INTRAVENOUS | Status: DC
  Administered 2021-09-04 – 2021-10-04 (×52): 3 mL via INTRAVENOUS

## 2021-09-04 MED ORDER — LIDOCAINE 5 % EX PTCH
1.0000 | MEDICATED_PATCH | CUTANEOUS | Status: DC
  Administered 2021-09-04 – 2021-10-04 (×28): 1 via TRANSDERMAL
  Filled 2021-09-04 (×33): qty 1

## 2021-09-04 MED ORDER — APIXABAN 5 MG PO TABS
5.0000 mg | ORAL_TABLET | Freq: Two times a day (BID) | ORAL | Status: DC
  Administered 2021-09-04 – 2021-09-21 (×36): 5 mg via ORAL
  Filled 2021-09-04 (×36): qty 1

## 2021-09-04 MED ORDER — ONDANSETRON HCL 4 MG/2ML IJ SOLN
4.0000 mg | Freq: Four times a day (QID) | INTRAMUSCULAR | Status: DC | PRN
  Administered 2021-09-20 – 2021-09-28 (×12): 4 mg via INTRAVENOUS
  Filled 2021-09-04 (×14): qty 2

## 2021-09-04 MED ORDER — OXYCODONE-ACETAMINOPHEN 5-325 MG PO TABS
1.0000 | ORAL_TABLET | ORAL | Status: DC | PRN
  Administered 2021-09-04 – 2021-09-16 (×46): 2 via ORAL
  Filled 2021-09-04 (×47): qty 2

## 2021-09-04 MED ORDER — OXYCODONE HCL ER 20 MG PO T12A
20.0000 mg | EXTENDED_RELEASE_TABLET | Freq: Two times a day (BID) | ORAL | Status: DC
  Administered 2021-09-04 (×3): 20 mg via ORAL
  Filled 2021-09-04: qty 2
  Filled 2021-09-04 (×2): qty 1
  Filled 2021-09-04: qty 2

## 2021-09-04 MED ORDER — MAGNESIUM HYDROXIDE 400 MG/5ML PO SUSP: 30.0000 mL | Freq: Every day | ORAL | Status: DC | PRN

## 2021-09-04 MED ORDER — POLYETHYLENE GLYCOL 3350 17 G PO PACK: 17.0000 g | PACK | Freq: Every day | ORAL | Status: DC | PRN

## 2021-09-04 MED ORDER — SODIUM CHLORIDE 0.9 % IV SOLN: INTRAVENOUS | Status: AC

## 2021-09-04 MED ORDER — SENNOSIDES-DOCUSATE SODIUM 8.6-50 MG PO TABS
1.0000 | ORAL_TABLET | Freq: Every day | ORAL | Status: DC
  Administered 2021-09-04 – 2021-09-09 (×6): 1 via ORAL
  Filled 2021-09-04 (×6): qty 1

## 2021-09-04 MED ORDER — ACETAMINOPHEN 650 MG RE SUPP: 650.0000 mg | Freq: Four times a day (QID) | RECTAL | Status: DC | PRN

## 2021-09-04 MED ORDER — HYDROMORPHONE HCL 1 MG/ML IJ SOLN
1.0000 mg | INTRAMUSCULAR | Status: DC | PRN
  Administered 2021-09-04 – 2021-09-05 (×6): 1 mg via INTRAVENOUS
  Filled 2021-09-04 (×6): qty 1

## 2021-09-04 MED ORDER — ONDANSETRON HCL 4 MG PO TABS: 4.0000 mg | ORAL_TABLET | Freq: Four times a day (QID) | ORAL | Status: DC | PRN

## 2021-09-04 MED ORDER — TRAZODONE HCL 100 MG PO TABS
100.0000 mg | ORAL_TABLET | Freq: Every day | ORAL | Status: DC
  Administered 2021-09-04 – 2021-09-30 (×28): 100 mg via ORAL
  Filled 2021-09-04 (×28): qty 1

## 2021-09-04 MED ORDER — LATANOPROST 0.005 % OP SOLN
1.0000 [drp] | Freq: Every day | OPHTHALMIC | Status: DC
  Administered 2021-09-04 – 2021-10-03 (×30): 1 [drp] via OPHTHALMIC
  Filled 2021-09-04 (×2): qty 2.5

## 2021-09-04 MED ORDER — BISACODYL 5 MG PO TBEC: 5.0000 mg | DELAYED_RELEASE_TABLET | Freq: Every day | ORAL | Status: DC | PRN

## 2021-09-04 MED ORDER — GABAPENTIN 100 MG PO CAPS
200.0000 mg | ORAL_CAPSULE | Freq: Two times a day (BID) | ORAL | Status: DC
Start: 2021-09-04 — End: 2021-09-12
  Administered 2021-09-04 – 2021-09-12 (×18): 200 mg via ORAL
  Filled 2021-09-04 (×18): qty 2

## 2021-09-04 MED ORDER — POLYETHYLENE GLYCOL 3350 17 G PO PACK
17.0000 g | PACK | Freq: Two times a day (BID) | ORAL | Status: DC
  Administered 2021-09-04 (×2): 17 g via ORAL
  Filled 2021-09-04 (×2): qty 1

## 2021-09-04 MED ORDER — ACETAMINOPHEN 325 MG PO TABS: 650.0000 mg | ORAL_TABLET | Freq: Four times a day (QID) | ORAL | Status: DC | PRN

## 2021-09-04 NOTE — Progress Notes (Signed)
PROGRESS NOTE  [name]  DOB: 1965/08/26  PCP: Kathyrn Lass YSA:[id number]  DOA: 09/05/2021  LOS: 0 days  Hospital Day: 2  Brief narrative: [name] is a 56 y.o. 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 At home, patient continued to have pleuritic chest pain despite short acting and long-acting pain medicines, unable to perform ADLs and hence he returned to the ED on 7/11.  In the ED, patient was afebrile, tachycardic to 130, blood pressure 142/73, breathing on room air at rest. EKG showed sinus tachycardia at the rate of 118 bpm. Labs showed WC count elevated 17.3, hemoglobin low at 9.6, BUN/creatinine elevated at 22/0.64, alk phos elevated to 426, troponin normal. Chest x-ray with stable right hilar/suprahilar mass and stable right basilar opacity.  Patient was started on IV pain medicine Admitted to hospitalist service  Subjective: Patient was seen and examined this afternoon.  Pleasant middle-aged African-American female.  Sitting up at the edge of the bed.  Getting started with physical therapy.  Heart rate up to 120s.  Continues to have chest pain. Chart reviewed  Assessment and plan: Intractable pleuritic chest pain  -Probably related to a combination of recent pulm embolism as well as lung mass.   Pain was intractable at home despite short acting long-acting pain medicines. -Currently on IV Dilaudid as needed, OxyContin and Percocet.  Also on gabapentin. -Also added Phenergan patch and incentive spirometry. -Bowel regimen as needed   Recent pulmonary embolism  -Bilateral PE diagnosed on 08/22/21.  Continue Eliquis. -No longer requiring supplemental oxygen.  Check ambulatory oxygen requirement.  Sinus tachycardia -Heart rate over 100 today.  Worse with exertion. -Related to pain, hypoxia elevated recently. -Continue to monitor  in telemetry.   Metastatic adenocarcinoma  -Suspected lung primary, has appt scheduled with Dr. Julien Nordmann for 09/11/21     Goals of care   Code Status: Full Code    Mobility: Encourage ambulation  Skin assessment:     Nutritional status:  Body mass index is 27.32 kg/m.          Diet:  Diet Order             Diet regular Room service appropriate? Yes; Fluid consistency: Thin  Diet effective now                   DVT prophylaxis:   apixaban (ELIQUIS) tablet 5 mg   Antimicrobials: None Fluid: None Consultants: None Family Communication: None at bedside  Status is: Observation  Continue in-hospital care because: Needs IV pain medicines Level of care: Telemetry   Dispo: The patient is from: Home              Anticipated d/c is to: Home              Patient currently is not medically stable to d/c.   Difficult to place patient No     Infusions:    Scheduled Meds:  apixaban  5 mg Oral BID   gabapentin  200 mg Oral BID   latanoprost  1 drop Both Eyes QHS   lidocaine  1 patch Transdermal Q24H   oxyCODONE  20 mg Oral Q12H   senna-docusate  1 tablet Oral QHS   sodium chloride flush  3 mL Intravenous Q12H   traZODone  100 mg Oral QHS    PRN meds: acetaminophen **OR** acetaminophen, bisacodyl, HYDROmorphone (DILAUDID)  injection, magnesium hydroxide, ondansetron **OR** ondansetron (ZOFRAN) IV, oxyCODONE-acetaminophen, polyethylene glycol   Antimicrobials: Anti-infectives (From admission, onward)    None       Objective: Vitals:   09/04/21 1100 09/04/21 1320  BP: 110/70 134/74  Pulse: (!) 108 (!) 118  Resp: 12 16  Temp:  98.7 F (37.1 C)  SpO2: 98% 94%    Intake/Output Summary (Last 24 hours) at 09/04/2021 1456 Last data filed at 09/04/2021 0847 Gross per 24 hour  Intake 1004.17 ml  Output --  Net 1004.17 ml   Filed Weights   09/15/2021 2130  Weight: 83.9 kg   Weight change:  Body mass index is 27.32 kg/m.   Physical  Exam: General exam: Pleasant, middle-aged African-American female.  Sitting up at the edge of the bed. Skin: No rashes, lesions or ulcers. HEENT: Atraumatic, normocephalic, no obvious bleeding Lungs: Diminished air entry in both bases CVS: Tachycardic, regular rhythm, no murmur GI/Abd soft, nontender, nondistended, bowel sound present CNS: Alert, awake, oriented x3 Psychiatry: Mood appropriate Extremities: No pedal edema, no calf tenderness  Data Review: I have personally reviewed the laboratory data and studies available.  F/u labs ordered Unresulted Labs (From admission, onward)    None       Signed, Terrilee Croak, MD [facility] 09/04/2021

## 2021-09-04 NOTE — H&P (Signed)
History and Physical    [name] VWU:[id number] DOB: Aug 06, 1965 DOA: 09/12/2021  PCP: Pcp, No   Patient coming from: Home   Chief Complaint: Severe pain   HPI: [name] is a pleasant 56 y.o. 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  Patient reports that she has been experiencing severe pleuritic pain in her chest that has left her unable to perform her ADLs at home.  She was discharged from the hospital on 08/26/2021 with long- and short-acting oxycodone.  She had improvement in her pain initially and was only using a long-acting oxycodone as needed, but the pain began to worsen again and became intolerable despite her home medications.  She denies any new cough or lightheadedness.  Leg swelling has improved some.  ED Course: Upon arrival to the ED, patient is found to be afebrile and saturating mid 90s on room air with elevated heart rate and stable blood pressure.  EKG features sinus tachycardia with rate 118.  Chest x-ray with stable right hilar/suprahilar mass and stable right basilar opacity.  Chemistry panel notable for increased BUN to creatinine ratio, alkaline phosphatase 426, and albumin 2.9.  There is a stable microcytic anemia and leukocytosis on CBC.  Troponin and BNP are normal.  Patient was treated with morphine and Zofran in the ED.  Review of Systems:  All other systems reviewed and apart from HPI, are negative.  Past Medical History:  Diagnosis Date   Allergy    Arthritis    Complication of anesthesia    No pertinent past medical history    PONV (postoperative nausea and vomiting)    nausea with dizziness    Past Surgical History:  Procedure Laterality Date   ABDOMINAL HYSTERECTOMY     CHOLECYSTECTOMY     ENDOMETRIAL ABLATION     KNEE ARTHROSCOPY     LAPAROSCOPIC ASSISTED VAGINAL HYSTERECTOMY  02/13/2011    Procedure: LAPAROSCOPIC ASSISTED VAGINAL HYSTERECTOMY;  Surgeon: Olga Millers;  Location: [facility];  Service: Gynecology;  Laterality: N/A;   SALPINGOOPHORECTOMY  02/13/2011   Procedure: SALPINGO OOPHERECTOMY;  Surgeon: Olga Millers;  Location: [facility];  Service: Gynecology;  Laterality: Bilateral;   TONSILLECTOMY      Social History:   reports that she has never smoked. She does not have any smokeless tobacco history on file. She reports current alcohol use of about 2.0 standard drinks of alcohol per week. She reports that she does not use drugs.  Allergies  Allergen Reactions   Fish Allergy Hives   Penicillins Hives   Augmentin [Amoxicillin-Pot Clavulanate] Hives   Betadine [Povidone Iodine] Hives and Other (See Comments)    Bad hives, per pt   Shellfish-Derived Products Hives   Tetanus Toxoids Hives    Family History  Problem Relation Age of Onset   Cancer Mother    Heart disease Father    Cancer Brother    Mental illness Son    Cancer Paternal Aunt    Cancer Paternal Uncle    Cancer Maternal Aunt      Prior to Admission medications   Medication Sig Start Date End Date Taking? Authorizing Provider  acetaminophen (TYLENOL) 325 MG tablet Take 2 tablets (650 mg total) by mouth every 6 (six) hours as needed for mild pain (or Fever >/= 101). 08/26/21  Yes Shalhoub, Sherryll Burger, MD  apixaban (ELIQUIS) 5 MG TABS  tablet Take 1 tablet (5 mg total) by mouth 2 (two) times daily. 09/01/21  Yes Shalhoub, Sherryll Burger, MD  gabapentin (NEURONTIN) 100 MG capsule Take 200 mg by mouth in the morning and at bedtime. 08/05/21  Yes [provider]  latanoprost (XALATAN) 0.005 % ophthalmic solution Place 1 drop into both eyes at bedtime.   Yes [provider]  oxyCODONE (OXYCONTIN) 20 mg 12 hr tablet Take 20 mg by mouth every 12 (twelve) hours as needed (for pain).   Yes [provider]  oxyCODONE-acetaminophen (PERCOCET/ROXICET) 5-325 MG tablet Take 1-2 tablets by mouth  every 4 (four) hours as needed for severe pain.   Yes [provider]  polyethylene glycol (MIRALAX / GLYCOLAX) 17 g packet Take 17 g by mouth 2 (two) times daily. 08/26/21  Yes Shalhoub, Sherryll Burger, MD  senna-docusate (SENOKOT-S) 8.6-50 MG tablet Take 1 tablet by mouth 2 (two) times daily. 08/26/21  Yes Shalhoub, Sherryll Burger, MD  traZODone (DESYREL) 50 MG tablet Take 100 mg by mouth at bedtime. 08/03/21  Yes [provider]  apixaban (ELIQUIS) 5 MG TABS tablet Take 2 tablets (10 mg total) by mouth 2 (two) times daily for 5 days. Patient not taking: Reported on 09/16/2021 08/26/21 09/08/2021  Vernelle Emerald, MD  baclofen (LIORESAL) 10 MG tablet Take 10 mg by mouth 3 (three) times daily. Patient not taking: Reported on 09/17/2021 08/04/21   [provider]  famotidine (PEPCID) 20 MG tablet Take 20 mg by mouth 2 (two) times daily. Patient not taking: Reported on 09/20/2021 07/02/21   [provider]    Physical Exam: Vitals:   08/31/2021 2230 09/11/2021 2300 09/21/2021 2330 09/04/21 0000  BP: 136/79 134/76 128/68 124/72  Pulse: (!) 124 (!) 117 (!) 116 (!) 118  Resp: (!) 21 (!) 24 17 20   Temp:      TempSrc:      SpO2: 94% 95% 94% 94%  Weight:      Height:        Constitutional: NAD, calm  Eyes: PERTLA, lids and conjunctivae normal ENMT: Mucous membranes are moist. Posterior pharynx clear of any exudate or lesions.   Neck: supple, no masses  Respiratory:  no wheezing, no crackles. No accessory muscle use.  Cardiovascular: Rate ~120 and regular. Pitting edema involving lower legs b/l. Abdomen: No distension, no tenderness, soft. Bowel sounds active.  Musculoskeletal: no clubbing / cyanosis. No joint deformity upper and lower extremities.   Skin: no significant rashes, lesions, ulcers. Warm, dry, well-perfused. Neurologic: CN 2-12 grossly intact. Moving all extremities. Alert and oriented.  Psychiatric: Pleasant. Cooperative.    Labs and Imaging on Admission: I have  personally reviewed following labs and imaging studies  CBC: Recent Labs  Lab 09/18/2021 2149  WBC 17.3*  NEUTROABS 13.0*  HGB 9.6*  HCT 30.5*  MCV 76.8*  PLT 161*   Basic Metabolic Panel: Recent Labs  Lab 08/25/2021 2149  NA 136  K 5.0  CL 98  CO2 24  GLUCOSE 148*  BUN 22*  CREATININE 0.64  CALCIUM 9.1   GFR: Estimated Creatinine Clearance: 88.7 mL/min (by C-G formula based on SCr of 0.64 mg/dL). Liver Function Tests: Recent Labs  Lab 09/10/2021 2149  AST 53*  ALT 36  ALKPHOS 426*  BILITOT 0.8  PROT 7.6  ALBUMIN 2.9*   No results for input(s): "LIPASE", "AMYLASE" in the last 168 hours. No results for input(s): "AMMONIA" in the last 168 hours. Coagulation Profile: No results for input(s): "INR", "PROTIME"  in the last 168 hours. Cardiac Enzymes: No results for input(s): "CKTOTAL", "CKMB", "CKMBINDEX", "TROPONINI" in the last 168 hours. BNP (last 3 results) No results for input(s): "PROBNP" in the last 8760 hours. HbA1C: No results for input(s): "HGBA1C" in the last 72 hours. CBG: No results for input(s): "GLUCAP" in the last 168 hours. Lipid Profile: No results for input(s): "CHOL", "HDL", "LDLCALC", "TRIG", "CHOLHDL", "LDLDIRECT" in the last 72 hours. Thyroid Function Tests: No results for input(s): "TSH", "T4TOTAL", "FREET4", "T3FREE", "THYROIDAB" in the last 72 hours. Anemia Panel: No results for input(s): "VITAMINB12", "FOLATE", "FERRITIN", "TIBC", "IRON", "RETICCTPCT" in the last 72 hours. Urine analysis:    Component Value Date/Time   COLORURINE YELLOW 02/14/2010 0600   APPEARANCEUR CLEAR 02/14/2010 0600   LABSPEC 1.028 02/14/2010 0600   PHURINE 5.5 02/14/2010 0600   GLUCOSEU NEGATIVE 02/14/2010 0600   HGBUR NEGATIVE 02/14/2010 0600   BILIRUBINUR LARGE (A) 02/14/2010 0600   KETONESUR NEGATIVE 02/14/2010 0600   PROTEINUR NEGATIVE 02/14/2010 0600   UROBILINOGEN 1.0 02/14/2010 0600   NITRITE NEGATIVE 02/14/2010 0600   LEUKOCYTESUR  02/14/2010 0600     NEGATIVE MICROSCOPIC NOT DONE ON URINES WITH NEGATIVE PROTEIN, BLOOD, LEUKOCYTES, NITRITE, OR GLUCOSE <1000 mg/dL.   Sepsis Labs: @LABRCNTIP (procalcitonin:4,lacticidven:4) )No results found for this or any previous visit (from the past 240 hour(s)).   Radiological Exams on Admission: DG Chest 2 View  Result Date: 09/05/2021 CLINICAL DATA:  Mid chest pain.  Lung cancer. EXAM: CHEST - 2 VIEW COMPARISON:  Chest CT 08/22/2021 FINDINGS: Right hilar/suprahilar mass appears grossly unchanged. There is some atelectasis and airspace disease in the left lung base which is unchanged. There is no pleural effusion or pneumothorax. Cardiac silhouette within normal limits. No acute fracture. IMPRESSION: 1. Stable right hilar/suprahilar mass. 2. Stable left basilar atelectasis/airspace disease. Electronically Signed   By: Ronney Asters M.D.   On: 09/19/2021 22:39    EKG: Independently reviewed. Sinus tachycardia, rate 118.   Assessment/Plan   1. Intractable pain  - Presents with severe pleuritic chest pain despite home medications though was only taking the long-acting oxycodone as needed  - Initial troponin is normal, no acute ischemic features on EKG, and this is likely related to her PE with possible infarction  - Treat acute pain with IV analgesics, schedule the long-acting oxycodone, continue gabapentin, and transition back to short-acting oxycodone as tolerated    2. Pulmonary embolism  - Bilateral PE diagnosed on 08/22/21  - No longer requiring supplemental oxygen - Continue Eliquis    3. Metastatic adenocarcinoma  - Suspected lung primary, has appt scheduled with Dr. Julien Nordmann for 09/11/21    DVT prophylaxis: Eliquis  Code Status: Full  Level of Care: Level of care: Telemetry Family Communication: None present  Disposition Plan:  Patient is from: home  Anticipated d/c is to: TBD Anticipated d/c date is: 7/12 or 09/05/21  Patient currently: pending pain-control, transition to oral  medications, PT eval  Consults called: None  Admission status: Observation     Vianne Bulls, MD [facility]  09/04/2021, 2:23 AM

## 2021-09-04 NOTE — Evaluation (Signed)
Physical Therapy Evaluation Patient Details Name: [name] MRN: [id number] DOB: [birth year] Today's Date: 09/04/2021  History of Present Illness  [name] is a pleasant 56 y.o. 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  Patient reports that she has been experiencing severe pleuritic pain in her chest that has left her unable to perform her ADLs at home.  She was discharged from the hospital on 08/26/2021 with long- and short-acting oxycodone.  Clinical Impression  Patient reporting chest discomfort. Patient assisted with min guard to and from [facility]. HR 110 -123, 965 on 2 L [place]. Patient  resides with sister who provides support. Pt admitted with above diagnosis.  Pt currently with functional limitations due to the deficits listed below (see PT Problem List). Pt will benefit from skilled PT to increase their independence and safety with mobility to allow discharge to the venue listed below.         Recommendations for follow up therapy are one component of a multi-disciplinary discharge planning process, led by the attending physician.  Recommendations may be updated based on patient status, additional functional criteria and insurance authorization.  Follow Up Recommendations Home health PT      Assistance Recommended at Discharge Intermittent Supervision/Assistance  Patient can return home with the following  A little help with walking and/or transfers;A little help with bathing/dressing/bathroom;Help with stairs or ramp for entrance;Assistance with cooking/housework;Assist for transportation    Equipment Recommendations Rolling walker (2 wheels)  Recommendations for Other Services       Functional Status Assessment Patient has had a recent decline in their functional status and demonstrates the ability to make significant  improvements in function in a reasonable and predictable amount of time.     Precautions / Restrictions Precautions Precaution Comments: on O2 2 L, monitor HR      Mobility  Bed Mobility Overal bed mobility: Needs Assistance Bed Mobility: Supine to Sit, Sit to Supine     Supine to sit: Supervision Sit to supine: Supervision        Transfers Overall transfer level: Needs assistance Equipment used: Rolling walker (2 wheels) Transfers: Sit to/from Stand, Bed to chair/wheelchair/BSC Sit to Stand: Min guard   Step pivot transfers: Min guard       General transfer comment: assists with lines    Ambulation/Gait               General Gait Details: NT  Stairs            Wheelchair Mobility    Modified Rankin (Stroke Patients Only)       Balance Overall balance assessment: Mild deficits observed, not formally tested                                           Pertinent Vitals/Pain Pain Assessment Pain Score: 8  Pain Location: back & chest Pain Descriptors / Indicators: Discomfort, Grimacing, Sharp, Shooting Pain Intervention(s): Limited activity within patient's tolerance, Monitored during session    Home Living Family/patient expects to be discharged to:: Private residence Living Arrangements: Other relatives Available Help at Discharge: Family;Available 24 hours/day Type of Home: House Home Access: Stairs to enter Entrance Stairs-Rails: Psychiatric nurse of Steps: 8   Home Layout: One level Home Equipment: Rollator (4  wheels);Shower seat;BSC/3in1      Prior Function Prior Level of Function : Needs assist       Physical Assist : Mobility (physical) Mobility (physical): Gait   Mobility Comments: sister assists witrh gait and rollator, patienmt requesting RW ADLs Comments: sister assists     Hand Dominance   Dominant Hand: Right    Extremity/Trunk Assessment   Upper Extremity Assessment Upper  Extremity Assessment: Overall WFL for tasks assessed    Lower Extremity Assessment Lower Extremity Assessment: Generalized weakness    Cervical / Trunk Assessment Cervical / Trunk Assessment: Normal  Communication   Communication: No difficulties  Cognition Arousal/Alertness: Awake/alert Behavior During Therapy: WFL for tasks assessed/performed Overall Cognitive Status: Within Functional Limits for tasks assessed                                          General Comments      Exercises     Assessment/Plan    PT Assessment Patient needs continued PT services  PT Problem List Decreased strength;Decreased mobility;Decreased activity tolerance;Cardiopulmonary status limiting activity;Decreased knowledge of use of DME;Decreased knowledge of precautions       PT Treatment Interventions DME instruction;Therapeutic activities;Gait training;Therapeutic exercise;Patient/family education;Functional mobility training    PT Goals (Current goals can be found in the Care Plan section)  Acute Rehab PT Goals Patient Stated Goal: to feel better PT Goal Formulation: With patient Time For Goal Achievement: 09/18/21 Potential to Achieve Goals: Good    Frequency Min 3X/week     Co-evaluation               AM-PAC PT "6 Clicks" Mobility  Outcome Measure Help needed turning from your back to your side while in a flat bed without using bedrails?: A Little Help needed moving from lying on your back to sitting on the side of a flat bed without using bedrails?: A Little Help needed moving to and from a bed to a chair (including a wheelchair)?: A Little Help needed standing up from a chair using your arms (e.g., wheelchair or bedside chair)?: A Little Help needed to walk in hospital room?: Total Help needed climbing 3-5 steps with a railing? : Total 6 Click Score: 14    End of Session   Activity Tolerance: Patient limited by fatigue Patient left: in bed;with call  bell/phone within reach Nurse Communication: Mobility status PT Visit Diagnosis: Unsteadiness on feet (R26.81);Muscle weakness (generalized) (M62.81);Difficulty in walking, not elsewhere classified (R26.2);Pain    Time: 1230-1250 PT Time Calculation (min) (ACUTE ONLY): 20 min   Charges:   PT Evaluation $PT Eval Low Complexity: 1 Low          [place] Office [phone] Weekend [phone]   Claretha Cooper 09/04/2021, 2:44 PM

## 2021-09-05 ENCOUNTER — Inpatient Hospital Stay (HOSPITAL_COMMUNITY): Payer: BLUE CROSS/BLUE SHIELD

## 2021-09-05 DIAGNOSIS — R071 Chest pain on breathing: Secondary | ICD-10-CM | POA: Diagnosis not present

## 2021-09-05 MED ORDER — HYDROMORPHONE HCL 1 MG/ML IJ SOLN
0.5000 mg | INTRAMUSCULAR | Status: DC | PRN
  Administered 2021-09-05 – 2021-09-06 (×4): 0.5 mg via INTRAVENOUS
  Filled 2021-09-05 (×4): qty 0.5

## 2021-09-05 MED ORDER — OXYCODONE HCL ER 20 MG PO T12A
30.0000 mg | EXTENDED_RELEASE_TABLET | Freq: Two times a day (BID) | ORAL | Status: DC
  Administered 2021-09-05 – 2021-09-08 (×6): 30 mg via ORAL
  Filled 2021-09-05 (×7): qty 1

## 2021-09-05 MED ORDER — IOHEXOL 300 MG/ML  SOLN
100.0000 mL | Freq: Once | INTRAMUSCULAR | Status: AC | PRN
  Administered 2021-09-06: 75 mL via INTRAVENOUS

## 2021-09-05 NOTE — Plan of Care (Signed)
?  Problem: Clinical Measurements: ?Goal: Ability to maintain clinical measurements within normal limits will improve ?Outcome: Progressing ?Goal: Will remain free from infection ?Outcome: Progressing ?Goal: Diagnostic test results will improve ?Outcome: Progressing ?  ?

## 2021-09-05 NOTE — TOC Initial Note (Signed)
Transition of Care [facility]) - Initial/Assessment Note    Patient Details  Name: [name] MRN: [id number] Date of Birth: [birth year]  Transition of Care [facility]) CM/SW Contact:    Leeroy Cha, RN Phone Number: 09/05/2021, 7:32 AM  Clinical Narrative:                  Transition of Care [facility]) Screening Note   Patient Details  Name: [name] Date of Birth: 1965/11/30   Transition of Care [facility]) CM/SW Contact:    Leeroy Cha, RN Phone Number: 09/05/2021, 7:33 AM    Transition of Care Department [facility]) has reviewed patient and no TOC needs have been identified at this time. We will continue to monitor patient advancement through interdisciplinary progression rounds. If new patient transition needs arise, please place a TOC consult.    Expected Discharge Plan: Home/Self Care Barriers to Discharge: Continued Medical Work up   Patient Goals and CMS Choice Patient states their goals for this hospitalization and ongoing recovery are:: to return home CMS Medicare.gov Compare Post Acute Care list provided to:: Patient    Expected Discharge Plan and Services Expected Discharge Plan: Home/Self Care   Discharge Planning Services: CM Consult   Living arrangements for the past 2 months: Single Family Home                                      Prior Living Arrangements/Services Living arrangements for the past 2 months: Single Family Home Lives with:: Self Patient language and need for interpreter reviewed:: Yes Do you feel safe going back to the place where you live?: Yes            Criminal Activity/Legal Involvement Pertinent to Current Situation/Hospitalization: No - Comment as needed  Activities of Daily Living Home Assistive Devices/Equipment: Walker (specify type), Eyeglasses ADL Screening (condition at time of admission) Patient's cognitive ability adequate to safely complete daily activities?: Yes Is the patient deaf or have difficulty  hearing?: No Does the patient have difficulty seeing, even when wearing glasses/contacts?: No Does the patient have difficulty concentrating, remembering, or making decisions?: No Patient able to express need for assistance with ADLs?: Yes Does the patient have difficulty dressing or bathing?: Yes Independently performs ADLs?: No Communication: Independent Dressing (OT): Independent Grooming: Independent Feeding: Independent Bathing: Independent Toileting: Needs assistance Is this a change from baseline?: Pre-admission baseline In/Out Bed: Needs assistance Is this a change from baseline?: Pre-admission baseline Walks in Home: Needs assistance (walker) Is this a change from baseline?: Pre-admission baseline Does the patient have difficulty walking or climbing stairs?: Yes Weakness of Legs: Both Weakness of Arms/Hands: Both  Permission Sought/Granted                  Emotional Assessment Appearance:: Appears stated age Attitude/Demeanor/Rapport: Engaged Affect (typically observed): Calm Orientation: : Oriented to Self, Oriented to Place, Oriented to  Time, Oriented to Situation Alcohol / Substance Use: Alcohol Use Psych Involvement: No (comment)  Admission diagnosis:  Tachycardia [R00.0] Chest pain [R07.9] Nonspecific chest pain [R07.9] Patient Active Problem List   Diagnosis Date Noted   Chest pain 09/04/2021   Metastatic adenocarcinoma ([place]) 09/04/2021   Intractable pain 09/04/2021   Acute pulmonary embolism ([place]) 08/22/2021   Acute pulmonary embolism, unspecified pulmonary embolism type, unspecified whether acute cor pulmonale present ([place]) 08/22/2021   Urinary incontinence 04/05/2011   PCP:  Pcp, No Pharmacy:  [facility] [place], [place] AT [facility] OF [address] [place] [address] Phone: [phone] Fax: [phone]  [id number] - [place], [place] [address] [place] [address] Phone: [phone] Fax: [phone]     Social Determinants of Health (SDOH) Interventions    Readmission Risk Interventions     No data to display

## 2021-09-05 NOTE — Plan of Care (Signed)

## 2021-09-05 NOTE — Progress Notes (Addendum)
PROGRESS NOTE  [name]  DOB: 10-11-1965  PCP: [name] RWE:[id number]  DOA: 09/16/2021  LOS: 1 day  Hospital Day: 3  Brief narrative: [name] is a 56 y.o. 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 At home, patient continued to have pleuritic chest pain despite short acting and long-acting pain medicines, unable to perform ADLs and hence he returned to the ED on 7/11.  In the ED, patient was afebrile, tachycardic to 130, blood pressure 142/73, breathing on room air at rest. EKG showed sinus tachycardia at the rate of 118 bpm. Labs showed WC count elevated 17.3, hemoglobin low at 9.6, BUN/creatinine elevated at 22/0.64, alk phos elevated to 426, troponin normal. Chest x-ray with stable right hilar/suprahilar mass and stable right basilar opacity.  Patient was started on IV pain medicine Admitted to hospitalist service  Subjective: Patient was seen and examined this morning.  Propped up in bed.  Not in distress.  Not requiring oxygen at rest this morning but easily desats on exertion.  At rest, she is tachycardic to 110s. Still requiring multiple oral and IV pain medicine for pain control. Patient showed me swelling in her left arm  Assessment and plan: Intractable pleuritic chest pain  -Probably related to a combination of recent pulm embolism as well as lung mass and multiple metastasis.   Pain was intractable at home despite short acting long-acting pain medicines. -Currently on IV Dilaudid as needed, OxyContin and Percocet.  Also on gabapentin.  I will increase the dose of oxycodone and minimize the use of IV pain meds. -Also added Phenergan patch and incentive spirometry. -Bowel regimen as needed   Recent pulmonary embolism  -Bilateral PE diagnosed on 08/22/21. Continue Eliquis. -No longer requiring supplemental oxygen.  Check  ambulatory oxygen requirement.   Metastatic adenocarcinoma  -Patient has history of lung cancer requiring right upper lobe lung wedge resection while in [place] in 2020.  Per care everywhere, pathology at that time showed moderately differentiated adenocarcinoma of the lung. -6/29, along with pulm embolism, CT angio of chest also showed large right suprahilar mass extending contiguously into the mediastinum and measuring about 6.5 cm maximal diameter. Malignant lesions were also seen throughout the liver, in the tail of the pancreas, in the left anterior fifth rib and multiple vertebral bodies as well as in the left breast tissue and pancreas. The primary lesion is uncertain and could represent the lung mass which is the largest, the breast lesions, or the pancreatic lesion.  -During her hospitalization, hospitalist discussed the case with oncologist Dr. Julien Nordmann.  Patient was suspected to have recurrence of lung cancer.  An appointment has been scheduled with Dr. Julien Nordmann for 09/11/21.  Left arm swelling -feels firm, nontender, doesn't feel attached to bone. -Need to r/o a muscle tumor. Obtain CT left arm with contrast.  Sinus tachycardia -Heart rate remained 100 today.  Worse with exertion. -Related to pain, hypoxia as well as recent PE -Continue to monitor in telemetry.  If does not improve in next 1 to 2 days, will add low-dose beta-blocker.  Goals of care   Code Status: Full Code    Mobility: Encourage ambulation  Skin assessment:     Nutritional status:  Body mass index is 27.32 kg/m.          Diet:  Diet Order             Diet  regular Room service appropriate? Yes; Fluid consistency: Thin  Diet effective now                   DVT prophylaxis:   apixaban (ELIQUIS) tablet 5 mg   Antimicrobials: None Fluid: None Consultants: None Family Communication: None at bedside  Status is: Observation  Continue in-hospital care because: Continues to need IV pain  medicines Level of care: Telemetry   Dispo: The patient is from: Home              Anticipated d/c is to: Home              Patient currently is not medically stable to d/c.   Difficult to place patient No     Infusions:    Scheduled Meds:  apixaban  5 mg Oral BID   gabapentin  200 mg Oral BID   latanoprost  1 drop Both Eyes QHS   lidocaine  1 patch Transdermal Q24H   oxyCODONE  30 mg Oral Q12H   senna-docusate  1 tablet Oral QHS   sodium chloride flush  3 mL Intravenous Q12H   traZODone  100 mg Oral QHS    PRN meds: acetaminophen **OR** acetaminophen, bisacodyl, HYDROmorphone (DILAUDID) injection, magnesium hydroxide, ondansetron **OR** ondansetron (ZOFRAN) IV, oxyCODONE-acetaminophen, polyethylene glycol   Antimicrobials: Anti-infectives (From admission, onward)    None       Objective: Vitals:   09/05/21 0135 09/05/21 0432  BP: 117/69 123/68  Pulse: (!) 115 (!) 109  Resp: 18 18  Temp: 99.2 F (37.3 C) 98.6 F (37 C)  SpO2: 100% 99%    Intake/Output Summary (Last 24 hours) at 09/05/2021 1248 Last data filed at 09/05/2021 0916 Gross per 24 hour  Intake 600 ml  Output 500 ml  Net 100 ml   Filed Weights   09/02/2021 2130  Weight: 83.9 kg   Weight change:  Body mass index is 27.32 kg/m.   Physical Exam: General exam: Pleasant, middle-aged African-American female.  Not in distress at rest  skin: No rashes, lesions or ulcers. HEENT: Atraumatic, normocephalic, no obvious bleeding Lungs: Diminished air entry in both bases.  Multiple tender points anteriorly and posteriorly CVS: Tachycardic, regular rhythm, no murmur GI/Abd soft, nontender, nondistended, bowel sound present CNS: Alert, awake, oriented x3 Psychiatry: Mood appropriate Extremities: No pedal edema, no calf tenderness.  Left arm swelling  Data Review: I have personally reviewed the laboratory data and studies available.  F/u labs ordered Unresulted Labs (From admission, onward)     Start      Ordered   09/06/21 6840  Basic metabolic panel  Tomorrow morning,   R        09/05/21 0812   09/06/21 0500  CBC with Differential/Platelet  Tomorrow morning,   R        09/05/21 3353            Signed, Terrilee Croak, MD [facility] 09/05/2021

## 2021-09-06 ENCOUNTER — Inpatient Hospital Stay (HOSPITAL_COMMUNITY): Payer: BLUE CROSS/BLUE SHIELD

## 2021-09-06 ENCOUNTER — Encounter (HOSPITAL_COMMUNITY): Payer: Self-pay | Admitting: Family Medicine

## 2021-09-06 ENCOUNTER — Ambulatory Visit
Admit: 2021-09-06 | Discharge: 2021-09-06 | Disposition: A | Discharge status: Home / Self Care | Payer: BLUE CROSS/BLUE SHIELD | Attending: Radiation Oncology | Admitting: Radiation Oncology

## 2021-09-06 ENCOUNTER — Encounter: Payer: Self-pay | Admitting: Radiation Oncology

## 2021-09-06 DIAGNOSIS — C787 Secondary malignant neoplasm of liver and intrahepatic bile duct: Secondary | ICD-10-CM | POA: Insufficient documentation

## 2021-09-06 DIAGNOSIS — C7951 Secondary malignant neoplasm of bone: Secondary | ICD-10-CM | POA: Insufficient documentation

## 2021-09-06 DIAGNOSIS — C3411 Malignant neoplasm of upper lobe, right bronchus or lung: Secondary | ICD-10-CM

## 2021-09-06 DIAGNOSIS — R071 Chest pain on breathing: Secondary | ICD-10-CM | POA: Diagnosis not present

## 2021-09-06 LAB — CBC WITH DIFFERENTIAL/PLATELET
Abs Immature Granulocytes: 0.24 10*3/uL — ABNORMAL HIGH (ref 0.00–0.07)
Basophils Absolute: 0.1 10*3/uL (ref 0.0–0.1)
Basophils Relative: 1 %
Eosinophils Absolute: 0.2 10*3/uL (ref 0.0–0.5)
Eosinophils Relative: 2 %
HCT: 29.9 % — ABNORMAL LOW (ref 36.0–46.0)
Hemoglobin: 9.1 g/dL — ABNORMAL LOW (ref 12.0–15.0)
Immature Granulocytes: 2 %
Lymphocytes Relative: 13 %
Lymphs Abs: 2 10*3/uL (ref 0.7–4.0)
MCH: 23.8 pg — ABNORMAL LOW (ref 26.0–34.0)
MCHC: 30.4 g/dL (ref 30.0–36.0)
MCV: 78.3 fL — ABNORMAL LOW (ref 80.0–100.0)
Monocytes Absolute: 1.8 10*3/uL — ABNORMAL HIGH (ref 0.1–1.0)
Monocytes Relative: 11 %
Neutro Abs: 11.3 10*3/uL — ABNORMAL HIGH (ref 1.7–7.7)
Neutrophils Relative %: 71 %
Platelets: 508 10*3/uL — ABNORMAL HIGH (ref 150–400)
RBC: 3.82 MIL/uL — ABNORMAL LOW (ref 3.87–5.11)
RDW: 15.9 % — ABNORMAL HIGH (ref 11.5–15.5)
WBC: 15.6 10*3/uL — ABNORMAL HIGH (ref 4.0–10.5)
nRBC: 0 % (ref 0.0–0.2)

## 2021-09-06 LAB — BASIC METABOLIC PANEL
Anion gap: 12 (ref 5–15)
BUN: 16 mg/dL (ref 6–20)
CO2: 28 mmol/L (ref 22–32)
Calcium: 9.3 mg/dL (ref 8.9–10.3)
Chloride: 101 mmol/L (ref 98–111)
Creatinine, Ser: 0.59 mg/dL (ref 0.44–1.00)
GFR, Estimated: >60 mL/min (ref >60)
Glucose, Bld: 123 mg/dL — ABNORMAL HIGH (ref 70–99)
Potassium: 4.2 mmol/L (ref 3.5–5.1)
Sodium: 141 mmol/L (ref 135–145)

## 2021-09-06 MED ORDER — FENTANYL 12 MCG/HR TD PT72
1.0000 | MEDICATED_PATCH | TRANSDERMAL | Status: DC
  Administered 2021-09-07: 1 via TRANSDERMAL
  Filled 2021-09-06 (×3): qty 1

## 2021-09-06 MED ORDER — HYDROMORPHONE HCL 1 MG/ML IJ SOLN
1.0000 mg | INTRAMUSCULAR | Status: DC | PRN
  Administered 2021-09-06 – 2021-10-02 (×61): 1 mg via INTRAVENOUS
  Filled 2021-09-06 (×64): qty 1

## 2021-09-06 MED ORDER — METOPROLOL TARTRATE 25 MG PO TABS
12.5000 mg | ORAL_TABLET | Freq: Two times a day (BID) | ORAL | Status: DC
  Administered 2021-09-06 – 2021-09-09 (×7): 12.5 mg via ORAL
  Filled 2021-09-06 (×7): qty 1

## 2021-09-06 MED ORDER — SODIUM CHLORIDE (PF) 0.9 % IJ SOLN
INTRAMUSCULAR | Status: AC
  Filled 2021-09-06: qty 50

## 2021-09-06 NOTE — Progress Notes (Signed)
Physical Therapy Treatment Patient Details Name: [name] MRN: [id number] DOB: March 08, 1965 Today's Date: 09/06/2021   History of Present Illness [name] is a pleasant 56 y.o. 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  Patient reports that she has been experiencing severe pleuritic pain in her chest that has left her unable to perform her ADLs at home.  She was discharged from the hospital on 08/26/2021 with long- and short-acting oxycodone.    PT Comments    The patient ambulated x 175 " on 2 L [place],  SPO2 >955, HR max 110-135. Continue PT  Recommendations for follow up therapy are one component of a multi-disciplinary discharge planning process, led by the attending physician.  Recommendations may be updated based on patient status, additional functional criteria and insurance authorization.  Follow Up Recommendations  Home health PT     Assistance Recommended at Discharge Intermittent Supervision/Assistance  Patient can return home with the following A little help with walking and/or transfers;A little help with bathing/dressing/bathroom;Help with stairs or ramp for entrance;Assistance with cooking/housework;Assist for transportation   Equipment Recommendations  Rolling walker (2 wheels)    Recommendations for Other Services       Precautions / Restrictions Precautions Precaution Comments: on O2 2 L, monitor HR     Mobility  Bed Mobility   Bed Mobility: Supine to Sit, Sit to Supine     Supine to sit: Supervision Sit to supine: Supervision        Transfers   Equipment used: Rolling walker (2 wheels)   Sit to Stand: Supervision                Ambulation/Gait Ambulation/Gait assistance: Supervision Gait Distance (Feet): 175 Feet Assistive device: Rolling walker (2 wheels) Gait Pattern/deviations:  Step-through pattern           Stairs             Wheelchair Mobility    Modified Rankin (Stroke Patients Only)       Balance Overall balance assessment: Mild deficits observed, not formally tested                                          Cognition Arousal/Alertness: Awake/alert                                              Exercises      General Comments        Pertinent Vitals/Pain Pain Assessment Pain Score: 3  Pain Location: back Pain Descriptors / Indicators: Discomfort, Grimacing, Sharp, Shooting Pain Intervention(s): Monitored during session    Home Living                          Prior Function            PT Goals (current goals can now be found in the care plan section) Progress towards PT goals: Progressing toward goals    Frequency    Min 3X/week      PT Plan Current plan remains appropriate    Co-evaluation  AM-PAC PT "6 Clicks" Mobility   Outcome Measure  Help needed turning from your back to your side while in a flat bed without using bedrails?: None Help needed moving from lying on your back to sitting on the side of a flat bed without using bedrails?: None Help needed moving to and from a bed to a chair (including a wheelchair)?: A Little Help needed standing up from a chair using your arms (e.g., wheelchair or bedside chair)?: A Little Help needed to walk in hospital room?: A Little Help needed climbing 3-5 steps with a railing? : A Lot 6 Click Score: 19    End of Session Equipment Utilized During Treatment: Oxygen Activity Tolerance: Patient tolerated treatment well Patient left: in bed;with call bell/phone within reach;with family/visitor present Nurse Communication: Mobility status PT Visit Diagnosis: Difficulty in walking, not elsewhere classified (R26.2)     Time: 7564-3329 PT Time Calculation (min) (ACUTE ONLY): 20 min  Charges:  $Gait  Training: 8-22 mins                     [place] [facility] [phone] Weekend [phone]    Claretha Cooper 09/06/2021, 1:22 PM

## 2021-09-06 NOTE — Progress Notes (Signed)
PROGRESS NOTE  [name]  DOB: 11-Dec-1965  PCP: Kathyrn Lass HTD:[id number]  DOA: 09/22/2021  LOS: 2 days  Hospital Day: 4  Brief narrative: [name] is a 56 y.o. 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 At home, patient continued to have pleuritic chest pain despite short acting and long-acting pain medicines, unable to perform ADLs and hence he returned to the ED on 7/11.  In the ED, patient was afebrile, tachycardic to 130, blood pressure 142/73, breathing on room air at rest. EKG showed sinus tachycardia at the rate of 118 bpm. Labs showed WC count elevated 17.3, hemoglobin low at 9.6, BUN/creatinine elevated at 22/0.64, alk phos elevated to 426, troponin normal. Chest x-ray with stable right hilar/suprahilar mass and stable right basilar opacity.  Patient was started on IV pain medicine Admitted to hospitalist service  Subjective: Patient was seen and examined this morning. Propped up in bed.  On low-flow oxygen.  Able to ambulate on the hallway with PT today.  Continues to have pain and requires IV pain medicines. Daughter at bedside.  Assessment and plan: Intractable pleuritic chest pain  -Probably related to a combination of recent pulm embolism as well as lung mass and multiple metastasis.   Pain was intractable at home despite short acting long-acting pain medicines. -Currently on IV Dilaudid as needed, OxyContin and Percocet.  Also on gabapentin.  -I wonder if patient's pain would response better to radiation.  I called for radiation oncology consultation today. -Recommended patient to minimize use of IV pain medicines.  I will also add fentanyl patch. -Bowel regimen as needed    Metastatic adenocarcinoma  -Patient has history of lung cancer requiring right upper lobe lung wedge resection while in [place] in 2020.  Per care  everywhere, pathology at that time showed moderately differentiated adenocarcinoma of the lung. -6/29, along with pulm embolism, CT angio of chest also showed large right suprahilar mass extending contiguously into the mediastinum and measuring about 6.5 cm maximal diameter. Malignant lesions were also seen throughout the liver, in the tail of the pancreas, in the left anterior fifth rib and multiple vertebral bodies as well as in the left breast tissue and pancreas. The primary lesion is uncertain and could represent the lung mass which is the largest, the breast lesions, or the pancreatic lesion.  -During her hospitalization, hospitalist discussed the case with oncologist Dr. Julien Nordmann.  Patient was suspected to have recurrence of lung cancer.  An appointment has been scheduled with Dr. Julien Nordmann for 09/11/21.  Recent pulmonary embolism  -Bilateral PE diagnosed on 08/22/21. Continue Eliquis. -No longer requiring supplemental oxygen.  Check ambulatory oxygen requirement.  Left arm swelling -Reported on 7/13.   -CT left humerus was obtained which showed likely changes along the posterior aspect of the glenoid neck with overlying soft tissue mass centered within the left infraspinatus muscle measuring up to 3.7 cm.  Findings most suggestive of tissue metastatic lesion with associated bony abnormality.  There is also an intramuscular metastasis in the biceps muscle measuring 6.2 cm.  There are also multiple soft tissue masses along the left chest wall and within the left breast soft tissues consistent with metastatic disease  Sinus tachycardia -Heart rate remains consistently over 100 at rest. -Related to pain, hypoxia as well as recent PE -To prevent tachycardia induced cardiomyopathy, I will start her on low-dose metoprolol.  Goals of care  Code Status: Full Code    Mobility: Encourage ambulation  Skin assessment:     Nutritional status:  Body mass index is 27.32 kg/m.          Diet:   Diet Order             Diet regular Room service appropriate? Yes; Fluid consistency: Thin  Diet effective now                   DVT prophylaxis:   apixaban (ELIQUIS) tablet 5 mg   Antimicrobials: None Fluid: None Consultants: None Family Communication: None at bedside  Status is: Observation  Continue in-hospital care because: Continues to need IV pain medicines.  Pending radiation oncology consult. Level of care: Telemetry   Dispo: The patient is from: Home              Anticipated d/c is to: Home              Patient currently is not medically stable to d/c. Difficult to place patient No     Infusions:    Scheduled Meds:  apixaban  5 mg Oral BID   fentaNYL  1 patch Transdermal Q72H   gabapentin  200 mg Oral BID   latanoprost  1 drop Both Eyes QHS   lidocaine  1 patch Transdermal Q24H   metoprolol tartrate  12.5 mg Oral BID   oxyCODONE  30 mg Oral Q12H   senna-docusate  1 tablet Oral QHS   sodium chloride flush  3 mL Intravenous Q12H   traZODone  100 mg Oral QHS    PRN meds: acetaminophen **OR** acetaminophen, bisacodyl, HYDROmorphone (DILAUDID) injection, magnesium hydroxide, ondansetron **OR** ondansetron (ZOFRAN) IV, oxyCODONE-acetaminophen, polyethylene glycol   Antimicrobials: Anti-infectives (From admission, onward)    None       Objective: Vitals:   09/05/21 2023 09/06/21 0532  BP: 120/72 127/79  Pulse: (!) 110 (!) 109  Resp: 18 18  Temp: 98.5 F (36.9 C) 98.2 F (36.8 C)  SpO2: 99% 100%    Intake/Output Summary (Last 24 hours) at 09/06/2021 1359 Last data filed at 09/06/2021 1300 Gross per 24 hour  Intake 1083 ml  Output 1500 ml  Net -417 ml   Filed Weights   09/09/2021 2130  Weight: 83.9 kg   Weight change:  Body mass index is 27.32 kg/m.   Physical Exam: General exam: Pleasant, middle-aged African-American female.  Not in distress at rest  skin: No rashes, lesions or ulcers. HEENT: Atraumatic, normocephalic, no  obvious bleeding Lungs: Diminished air entry in both bases.  Multiple tender points anteriorly and posteriorly CVS: Tachycardic, regular rhythm, no murmur GI/Abd soft, nontender, nondistended, bowel sound present CNS: Alert, awake, oriented x3 Psychiatry: Mood appropriate Extremities: No pedal edema, no calf tenderness.  Left arm swelling  Data Review: I have personally reviewed the laboratory data and studies available.  F/u labs ordered Unresulted Labs (From admission, onward)    None       Signed, Terrilee Croak, MD [facility] 09/06/2021

## 2021-09-06 NOTE — Progress Notes (Signed)
[place]         [phone] ________________________________  Initial inpatient Consultation  Name: [name] MRN: [id number]  Date: 09/06/2021  DOB: Jul 31, 1965  REFERRING PHYSICIAN: Terrilee Croak, MD  DIAGNOSIS: The primary encounter diagnosis was Primary cancer of right upper lobe of lung ([place]). Diagnoses of Metastases to the liver [facility]) and Pain from bone metastases [facility]) were also pertinent to this visit.    ICD-10-CM   1. Primary cancer of right upper lobe of lung (HCC)  C34.11     2. 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[place])  C78.7     3. 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 woman with a past medical history of Arthritis, IBS (irritable bowel syndrome), Lung mass, Murmur, cardiac, and OAB (overactive bladder), who was noted to have a suspicious right upper lobe lung mass while she was living in [place] in 2020 and was seen in [facility] in mid April. Patient had a chest x-ray done which showed a mass in right upper lobe and was subsequently referred to thoracic surgery. Patient underwent right video-assisted upper lobe lung wedge resection and a right upper lobectomy along with thoracic lymphadenectomy in Jul 08, 2018 at [facility] in [place].  Pathology at that time showed a 3.5 cm moderately differentiated adenocarcinoma with negative lymph nodes (pT2a pN0 cM0).  Tumor expressed EGFR mutation, so she received osimertinib as adjuvant therapy, which began September, 2020.  In follow-up Chest CTs , 9/20, 2/21, 6/21, 12/21, 9/22, 12/22 were NED.    She developed constipation and CT abdomen/pelvis on 07/06/21 at the [place] [facility] [place].  This was notable for numerous hypodense lesions throughout the liver concerning for metastatic disease.  Liver biopsy there on 08/05/21 confirmed non-small cell lung cancer.  She also had back pain at approximately T11 with radiation to left; no  neurologic deficit.  She was admitted to the hospital here on 08/22/21 with SOB and Chest CT showed Large right suprahilar mass extending contiguously into the mediastinum and measuring about 6.5 cm maximal diameter.    A 5.7 cm in the left anterior fifth rib lesion     and T11 pathologic fracture.     She was discharge with oncology referral, set up for 09/11/21 with Dr. Julien Nordmann.  She was re-admitted on 09/23/2021 .  PREVIOUS RADIATION THERAPY: {EXAM; YES/NO:19492::"No"}  Past Medical History:  Diagnosis Date   Allergy    Arthritis    Complication of anesthesia    No pertinent past medical history    PONV (postoperative nausea and vomiting)    nausea with dizziness  :   Past Surgical History:  Procedure Laterality Date   ABDOMINAL HYSTERECTOMY     CHOLECYSTECTOMY     ENDOMETRIAL ABLATION     KNEE ARTHROSCOPY     LAPAROSCOPIC ASSISTED VAGINAL HYSTERECTOMY  02/13/2011   Procedure: LAPAROSCOPIC ASSISTED VAGINAL HYSTERECTOMY;  Surgeon: Olga Millers;  Location: [facility];  Service: Gynecology;  Laterality: N/A;   SALPINGOOPHORECTOMY  02/13/2011   Procedure: SALPINGO OOPHERECTOMY;  Surgeon: Olga Millers;  Location: [place] [place] [facility];  Service: Gynecology;  Laterality: Bilateral;   TONSILLECTOMY    :  No current facility-administered medications for this encounter. No current outpatient medications on file.  Facility-Administered Medications Ordered in Other Encounters:    acetaminophen (TYLENOL) tablet 650 mg, 650 mg, Oral, Q6H PRN **OR** acetaminophen (TYLENOL) suppository 650  mg, 650 mg, Rectal, Q6H PRN, Opyd, Ilene Qua, MD   apixaban (ELIQUIS) tablet 5 mg, 5 mg, Oral, BID, Opyd, Ilene Qua, MD, 5 mg at 09/05/21 2120   bisacodyl (DULCOLAX) EC tablet 5 mg, 5 mg, Oral, Daily PRN, Opyd, Ilene Qua, MD   gabapentin (NEURONTIN) capsule 200 mg, 200 mg, Oral, BID, Opyd, Ilene Qua, MD, 200 mg at 09/05/21 2119   HYDROmorphone (DILAUDID) injection 1 mg, 1 mg, Intravenous, Q4H PRN, Dahal,  Binaya, MD   iohexol (OMNIPAQUE) 300 MG/ML solution 100 mL, 100 mL, Intravenous, Once PRN, Dahal, Binaya, MD   latanoprost (XALATAN) 0.005 % ophthalmic solution 1 drop, 1 drop, Both Eyes, QHS, Opyd, Timothy S, MD, 1 drop at 09/05/21 2122   lidocaine (LIDODERM) 5 % 1 patch, 1 patch, Transdermal, Q24H, Dahal, Binaya, MD, 1 patch at 09/05/21 1243   magnesium hydroxide (MILK OF MAGNESIA) suspension 30 mL, 30 mL, Oral, Daily PRN, Opyd, Ilene Qua, MD   ondansetron (ZOFRAN) tablet 4 mg, 4 mg, Oral, Q6H PRN **OR** ondansetron (ZOFRAN) injection 4 mg, 4 mg, Intravenous, Q6H PRN, Opyd, Ilene Qua, MD   oxyCODONE (OXYCONTIN) 12 hr tablet 30 mg, 30 mg, Oral, Q12H, Dahal, Binaya, MD, 30 mg at 09/05/21 2120   oxyCODONE-acetaminophen (PERCOCET/ROXICET) 5-325 MG per tablet 1-2 tablet, 1-2 tablet, Oral, Q4H PRN, Opyd, Ilene Qua, MD, 2 tablet at 09/06/21 0902   polyethylene glycol (MIRALAX / GLYCOLAX) packet 17 g, 17 g, Oral, Daily PRN, Dahal, Binaya, MD   senna-docusate (Senokot-S) tablet 1 tablet, 1 tablet, Oral, QHS, Dahal, Binaya, MD, 1 tablet at 09/05/21 2119   sodium chloride flush (NS) 0.9 % injection 3 mL, 3 mL, Intravenous, Q12H, Opyd, Ilene Qua, MD, 3 mL at 09/05/21 2123   traZODone (DESYREL) tablet 100 mg, 100 mg, Oral, QHS, Opyd, Ilene Qua, MD, 100 mg at 09/05/21 2120:   Allergies  Allergen Reactions   Fish Allergy Hives   Penicillins Hives   Augmentin [Amoxicillin-Pot Clavulanate] Hives   Betadine [Povidone Iodine] Hives and Other (See Comments)    Bad hives, per pt   Shellfish-Derived Products Hives   Tetanus Toxoids Hives  :   Family History  Problem Relation Age of Onset   Cancer Mother    Heart disease Father    Cancer Brother    Mental illness Son    Cancer Paternal Aunt    Cancer Paternal Uncle    Cancer Maternal Aunt   :   Social History   Socioeconomic History   Marital status: Divorced    Spouse name: Not on file   Number of children: Not on file   Years of education:  Not on file   Highest education level: Not on file  Occupational History   Not on file  Tobacco Use   Smoking status: Never   Smokeless tobacco: Not on file  Substance and Sexual Activity   Alcohol use: Yes    Alcohol/week: 2.0 standard drinks of alcohol    Types: 2 Glasses of wine per week    Comment: occasionally   Drug use: No   Sexual activity: Not Currently  Other Topics Concern   Not on file  Social History Narrative   Not on file   Social Determinants of Health   Financial Resource Strain: Not on file  Food Insecurity: Not on file  Transportation Needs: Not on file  Physical Activity: Not on file  Stress: Not on file  Social Connections: Not on file  Intimate Partner Violence: Not on file  :  REVIEW OF SYSTEMS:  A 15 point review of systems is documented in the electronic medical record. This was obtained by the nursing staff. However, I reviewed this with the patient to discuss relevant findings and make appropriate changes.  {Ros - complete:30496}   PHYSICAL EXAM:  There were no vitals taken for this visit. {physical exam:3041130}  KPS = ***  100 - Normal; no complaints; no evidence of disease. 90   - Able to carry on normal activity; minor signs or symptoms of disease. 80   - Normal activity with effort; some signs or symptoms of disease. 32   - Cares for self; unable to carry on normal activity or to do active work. 60   - Requires occasional assistance, but is able to care for most of his personal needs. 50   - Requires considerable assistance and frequent medical care. 65   - Disabled; requires special care and assistance. 28   - Severely disabled; hospital admission is indicated although death not imminent. 24   - Very sick; hospital admission necessary; active supportive treatment necessary. 10   - Moribund; fatal processes progressing rapidly. 0     - Dead  Karnofsky DA, Abelmann [place], Craver LS and Burchenal [facility] [phone]) The use of the nitrogen mustards  in the palliative treatment of carcinoma: with particular reference to bronchogenic carcinoma Cancer 1 634-56  LABORATORY DATA:  Lab Results  Component Value Date   WBC 15.6 (H) 09/06/2021   HGB 9.1 (L) 09/06/2021   HCT 29.9 (L) 09/06/2021   MCV 78.3 (L) 09/06/2021   PLT 508 (H) 09/06/2021   Lab Results  Component Value Date   NA 141 09/06/2021   K 4.2 09/06/2021   CL 101 09/06/2021   CO2 28 09/06/2021   Lab Results  Component Value Date   ALT 28 09/04/2021   AST 39 09/04/2021   ALKPHOS 391 (H) 09/04/2021   BILITOT 0.6 09/04/2021     RADIOGRAPHY: DG Shoulder Left  Result Date: 09/04/2021 CLINICAL DATA:  Left arm swelling. EXAM: LEFT SHOULDER - 2+ VIEW COMPARISON:  None Available. FINDINGS: There is no evidence of fracture or dislocation. There is no evidence of arthropathy or other focal bone abnormality. Soft tissues are unremarkable. IMPRESSION: Negative. Electronically Signed   By: Ronney Asters M.D.   On: 09/04/2021 19:38   DG Chest 2 View  Result Date: 09/22/2021 CLINICAL DATA:  Mid chest pain.  Lung cancer. EXAM: CHEST - 2 VIEW COMPARISON:  Chest CT 08/22/2021 FINDINGS: Right hilar/suprahilar mass appears grossly unchanged. There is some atelectasis and airspace disease in the left lung base which is unchanged. There is no pleural effusion or pneumothorax. Cardiac silhouette within normal limits. No acute fracture. IMPRESSION: 1. Stable right hilar/suprahilar mass. 2. Stable left basilar atelectasis/airspace disease. Electronically Signed   By: Ronney Asters M.D.   On: 09/15/2021 22:39   ECHOCARDIOGRAM COMPLETE  Result Date: 08/22/2021    ECHOCARDIOGRAM REPORT   Patient Name:   MILESSA HOGAN Date of Exam: 08/22/2021 Medical Rec #:  [id number]        Height:       69.0 in Accession #:    [id number]       Weight:       185.0 lb Date of Birth:  Jun 29, 1965         BSA:          1.999 m Patient Age:    31 years  BP:           120/80 mmHg Patient Gender: F                 HR:           100 bpm. Exam Location:  Inpatient Procedure: 2D Echo, Cardiac Doppler and Color Doppler Indications:    Pulmonary embolism  History:        Patient has no prior history of Echocardiogram examinations.                 Signs/Symptoms:Chest Pain and Shortness of Breath.  Sonographer:    Joette Catching RCS Referring Phys: [id number] [place]  1. Left ventricular ejection fraction, by estimation, is 60 to 65%. The left ventricle has normal function. The left ventricle has no regional wall motion abnormalities. Left ventricular diastolic parameters are consistent with Grade I diastolic dysfunction (impaired relaxation).  2. Right ventricular systolic function is normal. The right ventricular size is normal. There is moderately elevated pulmonary artery systolic pressure. The estimated right ventricular systolic pressure is 94.1 mmHg.  3. The mitral valve is normal in structure. No evidence of mitral valve regurgitation. No evidence of mitral stenosis.  4. The aortic valve is normal in structure. Aortic valve regurgitation is not visualized. No aortic stenosis is present.  5. The inferior vena cava is normal in size with <50% respiratory variability, suggesting right atrial pressure of 8 mmHg.  6. There is a small to moderate circumferential pericardial effusion present. The pericardial effusion measures 1.45cm at greatest diameter posteriorly.     There is no evidence of cardiac tamponade. FINDINGS  Left Ventricle: Left ventricular ejection fraction, by estimation, is 60 to 65%. The left ventricle has normal function. The left ventricle has no regional wall motion abnormalities. The left ventricular internal cavity size was normal in size. There is  no left ventricular hypertrophy. Left ventricular diastolic parameters are consistent with Grade I diastolic dysfunction (impaired relaxation). Normal left ventricular filling pressure. Right Ventricle: The right ventricular size is normal. No  increase in right ventricular wall thickness. Right ventricular systolic function is normal. There is moderately elevated pulmonary artery systolic pressure. The tricuspid regurgitant velocity is 3.45 m/s, and with an assumed right atrial pressure of 8 mmHg, the estimated right ventricular systolic pressure is 74.0 mmHg. Left Atrium: Left atrial size was normal in size. Right Atrium: Right atrial size was normal in size. Pericardium: The pericardial effusion measures 1.45cm at greatest diameter posteriorly. A small pericardial effusion is present. The pericardial effusion is circumferential. There is no evidence of cardiac tamponade. Mitral Valve: The mitral valve is normal in structure. No evidence of mitral valve regurgitation. No evidence of mitral valve stenosis. Tricuspid Valve: The tricuspid valve is normal in structure. Tricuspid valve regurgitation is mild . No evidence of tricuspid stenosis. Aortic Valve: The aortic valve is normal in structure. Aortic valve regurgitation is not visualized. No aortic stenosis is present. Aortic valve mean gradient measures 5.0 mmHg. Aortic valve peak gradient measures 8.6 mmHg. Aortic valve area, by VTI measures 2.81 cm. Pulmonic Valve: The pulmonic valve was normal in structure. Pulmonic valve regurgitation is not visualized. No evidence of pulmonic stenosis. Aorta: The aortic root is normal in size and structure. Venous: The inferior vena cava is normal in size with less than 50% respiratory variability, suggesting right atrial pressure of 8 mmHg. IAS/Shunts: No atrial level shunt detected by color flow Doppler.  LEFT VENTRICLE PLAX 2D LVIDd:  3.50 cm   Diastology LVIDs:         2.20 cm   LV e' medial:    7.18 cm/s LV PW:         1.00 cm   LV E/e' medial:  9.2 LV IVS:        0.90 cm   LV e' lateral:   10.60 cm/s LVOT diam:     2.10 cm   LV E/e' lateral: 6.2 LV SV:         73 LV SV Index:   37 LVOT Area:     3.46 cm  RIGHT VENTRICLE             IVC RV Basal diam:   3.10 cm     IVC diam: 1.70 cm RV Mid diam:    2.30 cm RV S prime:     14.20 cm/s TAPSE (M-mode): 2.0 cm LEFT ATRIUM             Index        RIGHT ATRIUM           Index LA diam:        2.70 cm 1.35 cm/m   RA Area:     10.70 cm LA Vol (A2C):   29.2 ml 14.61 ml/m  RA Volume:   23.00 ml  11.51 ml/m LA Vol (A4C):   22.8 ml 11.41 ml/m LA Biplane Vol: 27.1 ml 13.56 ml/m  AORTIC VALVE AV Area (Vmax):    3.04 cm AV Area (Vmean):   2.82 cm AV Area (VTI):     2.81 cm AV Vmax:           147.00 cm/s AV Vmean:          110.000 cm/s AV VTI:            0.261 m AV Peak Grad:      8.6 mmHg AV Mean Grad:      5.0 mmHg LVOT Vmax:         129.00 cm/s LVOT Vmean:        89.600 cm/s LVOT VTI:          0.212 m LVOT/AV VTI ratio: 0.81  AORTA Ao Root diam: 3.10 cm Ao Asc diam:  2.70 cm MITRAL VALVE               TRICUSPID VALVE MV Area (PHT): 9.14 cm    TR Peak grad:   47.6 mmHg MV Decel Time: 83 msec     TR Vmax:        345.00 cm/s MV E velocity: 66.00 cm/s MV A velocity: 91.30 cm/s  SHUNTS MV E/A ratio:  0.72        Systemic VTI:  0.21 m                            Systemic Diam: 2.10 cm Fransico Him MD Electronically signed by Fransico Him MD Signature Date/Time: 08/22/2021/10:10:00 AM    Final    CT Angio Chest PE W/Cm &/Or Wo Cm  Result Date: 08/22/2021 CLINICAL DATA:  Pulmonary embolus suspected. Positive D-dimer. Chest pain and shortness of breath. EXAM: CT ANGIOGRAPHY CHEST WITH CONTRAST TECHNIQUE: Multidetector CT imaging of the chest was performed using the standard protocol during bolus administration of intravenous contrast. Multiplanar CT image reconstructions and MIPs were obtained to evaluate the vascular anatomy. RADIATION DOSE REDUCTION: This exam was performed according to the departmental dose-optimization  program which includes automated exposure control, adjustment of the mA and/or kV according to patient size and/or use of iterative reconstruction technique. CONTRAST:  53m OMNIPAQUE IOHEXOL 350 MG/ML SOLN  COMPARISON:  None Available. FINDINGS: Cardiovascular: Good opacification of the central and segmental pulmonary arteries. The examination is positive for multiple pulmonary emboli demonstrated in bilateral upper and lower lobe segmental and subsegmental branches. No large central pulmonary embolus. The RV to LV ratio is elevated at 1.1, however, there is evidence of left ventricular hypertrophy which may be responsible for this. Cardiac enlargement. Small pericardial effusion. Normal caliber thoracic aorta. No aortic dissection. Mediastinum/Nodes: There is a large right suprahilar mass extending into the mediastinum and likely involving right hilar and mediastinal lymph nodes as well. The mass surrounds the right mainstem bronchus and crosses the midline at the level of the carina. Overall, the mass measures about 4.4 x 6.5 cm. The esophagus is decompressed. Lungs/Pleura: Small left pleural effusion with consolidation or atelectasis in the left lung base. Mild atelectasis in the right lung base. No pneumothorax. Upper Abdomen: Diffusely heterogeneous nodular appearance to the liver likely indicating diffuse liver metastasis. There is a mass in the tail of the pancreas measuring 3.3 cm diameter. Mass lesions are identified in the left inferior outer breast tissue, with largest measuring 2.7 cm in diameter. Musculoskeletal: There is an expansile mass lesion involving the anterior left fifth rib with soft tissue component measuring 3.7 x 5.7 cm. Lucent lesion with superior endplate compression fracture at T10 and additional smaller vertebral lucencies are likely metastatic. Review of the MIP images confirms the above findings. IMPRESSION: 1. Positive examination for bilateral segmental and subsegmental pulmonary emboli. 2. Large right suprahilar mass extending contiguously into the mediastinum and measuring about 6.5 cm maximal diameter. Malignant lesions are also demonstrated throughout the liver, in the tail of the  pancreas, in the left anterior fifth rib and multiple vertebral bodies as well as in the left breast tissue and pancreas. The primary lesion is uncertain and could represent the lung mass which is the largest, the breast lesions, or the pancreatic lesion. Tissue sampling is recommended. 3. Small left pleural effusion with consolidation or atelectasis in the left lung base. 4. Cardiac enlargement with left ventricular hypertrophy and small pericardial effusion. Critical Value/emergent results were called by telephone at the time of interpretation on 08/22/2021 at 1:19 am [facility] verbally acknowledged these results. Electronically Signed   By: WLucienne CapersM.D.   On: 08/22/2021 01:29   DG Chest 1 View  Result Date: 08/21/2021 CLINICAL DATA:  Chest pain, back pain EXAM: CHEST  1 VIEW COMPARISON:  05/13/2020 FINDINGS: Lungs volumes are small and there is left basilar atelectasis. No pneumothorax or pleural effusion. Cardiac size within normal limits. Pulmonary vascularity is normal. Osseous structures are age-appropriate. No acute bone abnormality. IMPRESSION: Pulmonary hypoinflation. Electronically Signed   By: AFidela SalisburyM.D.   On: 08/21/2021 21:57      IMPRESSION: ***  PLAN:***  I spent {CHL ONC TIME VISIT - SVZCHY:[id number]}minutes face to face with the patient and more than 50% of that time was spent in counseling and/or coordination of care.   ------------------------------------------------   MTyler Pita MD CCape May PointDirector and Director of Stereotactic Radiosurgery Direct Dial: [phone] Fax: [phone]  Skype  LinkedIn

## 2021-09-07 ENCOUNTER — Inpatient Hospital Stay (HOSPITAL_COMMUNITY): Payer: BLUE CROSS/BLUE SHIELD

## 2021-09-07 DIAGNOSIS — R071 Chest pain on breathing: Secondary | ICD-10-CM | POA: Diagnosis not present

## 2021-09-07 MED ORDER — ORAL CARE MOUTH RINSE: 15.0000 mL | OROMUCOSAL | Status: DC | PRN

## 2021-09-07 NOTE — Plan of Care (Signed)

## 2021-09-07 NOTE — Progress Notes (Signed)
PROGRESS NOTE  [name]  DOB: 1965-04-26  PCP: Kathyrn Lass NOM:[id number]  DOA: 09/13/2021  LOS: 3 days  Hospital Day: 5  Brief narrative: [name] is a 56 y.o. 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 At home, patient continued to have pleuritic chest pain despite short acting and long-acting pain medicines, unable to perform ADLs and hence he returned to the ED on 7/11.  In the ED, patient was afebrile, tachycardic to 130, blood pressure 142/73, breathing on room air at rest. EKG showed sinus tachycardia at the rate of 118 bpm. Labs showed WC count elevated 17.3, hemoglobin low at 9.6, BUN/creatinine elevated at 22/0.64, alk phos elevated to 426, troponin normal. Chest x-ray with stable right hilar/suprahilar mass and stable right basilar opacity.  Patient was started on IV pain medicine Admitted to hospitalist service  Subjective: Patient was seen and examined this morning. Propped up in bed.  Not in distress.  Pain partially controlled.  I had ordered for fentanyl patch yesterday but patient is reluctant to try that. Seen by radiation oncology.  Assessment and plan: Intractable pleuritic chest pain  -Probably related to a combination of recent pulm embolism as well as lung mass and multiple metastasis.   Pain was intractable at home despite short acting long-acting pain medicines. -Currently on OxyContin 30 mg twice daily, Neurontin 200 mg twice daily, as needed Percocet and as needed IV Dilaudid.  Yesterday I started on fentanyl patch 12 mcg but patient is reluctant to try.  Counseling done this morning.  She says she would like to talk to her family before trying it. -7/14, radiation oncology consulted.  I do not see a formal note but patient states that she was seen and the plan is to start on radiation starting Monday 7/17. -At risk  of constipation because of additive use of opioids.  Obtain KUB.  Bowel regimen.    Metastatic adenocarcinoma  -Patient has history of lung cancer requiring right upper lobe lung wedge resection while in [place] in 2020.  Per care everywhere, pathology at that time showed moderately differentiated adenocarcinoma of the lung. -6/29, along with pulm embolism, CT angio of chest also showed large right suprahilar mass extending contiguously into the mediastinum and measuring about 6.5 cm maximal diameter. Malignant lesions were also seen throughout the liver, in the tail of the pancreas, in the left anterior fifth rib and multiple vertebral bodies as well as in the left breast tissue and pancreas. The primary lesion is uncertain and could represent the lung mass which is the largest, the breast lesions, or the pancreatic lesion.  -During her hospitalization, hospitalist discussed the case with oncologist Dr. Julien Nordmann.  Patient was suspected to have recurrence of lung cancer.  An appointment has been scheduled with Dr. Julien Nordmann for 09/11/21.  Recent pulmonary embolism  -Bilateral PE diagnosed on 08/22/21. Continue Eliquis. -No longer requiring supplemental oxygen.  Check ambulatory oxygen requirement.  Left arm swelling -Reported on 7/13.   -7/14, CT left humerus was obtained which showed likely changes along the posterior aspect of the glenoid neck with overlying soft tissue mass centered within the left infraspinatus muscle measuring up to 3.7 cm.  Findings most suggestive of tissue metastatic lesion with associated bony abnormality.  There is also an intramuscular metastasis in the biceps muscle measuring 6.2 cm.  There are also multiple soft tissue masses along the left chest  wall and within the left breast soft tissues consistent with metastatic disease  Sinus tachycardia -Heart rate remains consistently over 100 at rest. -Related to pain, hypoxia as well as recent PE -To prevent tachycardia induced  cardiomyopathy, I started her on low-dose metoprolol at 12.5 mg twice daily.  Goals of care   Code Status: Full Code    Mobility: Encourage ambulation  Skin assessment:     Nutritional status:  Body mass index is 27.32 kg/m.          Diet:  Diet Order             Diet regular Room service appropriate? Yes; Fluid consistency: Thin  Diet effective now                   DVT prophylaxis:   apixaban (ELIQUIS) tablet 5 mg   Antimicrobials: None Fluid: None Consultants: None Family Communication: None at bedside  Status is: Observation  Continue in-hospital care because: Continues to need IV pain medicines.  Pending radiation from Monday. Level of care: Telemetry   Dispo: The patient is from: Home              Anticipated d/c is to: Home              Patient currently is not medically stable to d/c. Difficult to place patient No     Infusions:    Scheduled Meds:  apixaban  5 mg Oral BID   fentaNYL  1 patch Transdermal Q72H   gabapentin  200 mg Oral BID   latanoprost  1 drop Both Eyes QHS   lidocaine  1 patch Transdermal Q24H   metoprolol tartrate  12.5 mg Oral BID   oxyCODONE  30 mg Oral Q12H   senna-docusate  1 tablet Oral QHS   sodium chloride flush  3 mL Intravenous Q12H   traZODone  100 mg Oral QHS    PRN meds: acetaminophen **OR** acetaminophen, bisacodyl, HYDROmorphone (DILAUDID) injection, magnesium hydroxide, ondansetron **OR** ondansetron (ZOFRAN) IV, mouth rinse, oxyCODONE-acetaminophen, polyethylene glycol   Antimicrobials: Anti-infectives (From admission, onward)    None       Objective: Vitals:   09/07/21 0131 09/07/21 0439  BP: 122/74 113/72  Pulse: 97 (!) 104  Resp: 17 18  Temp: 98.7 F (37.1 C) 98.1 F (36.7 C)  SpO2: 100% 100%    Intake/Output Summary (Last 24 hours) at 09/07/2021 1055 Last data filed at 09/07/2021 0935 Gross per 24 hour  Intake 960 ml  Output 600 ml  Net 360 ml    Filed Weights   09/21/2021  2130  Weight: 83.9 kg   Weight change:  Body mass index is 27.32 kg/m.   Physical Exam: General exam: Pleasant, middle-aged African-American female.  Not in distress at rest  skin: No rashes, lesions or ulcers. HEENT: Atraumatic, normocephalic, no obvious bleeding Lungs: Diminished air entry in both bases.  Multiple tender points anteriorly and posteriorly CVS: Tachycardic, regular rhythm, no murmur GI/Abd soft, nontender, nondistended, bowel sound present CNS: Alert, awake, oriented x3 Psychiatry: Mood appropriate Extremities: No pedal edema, no calf tenderness.  Left arm swelling persists  Data Review: I have personally reviewed the laboratory data and studies available.  F/u labs ordered Unresulted Labs (From admission, onward)    None       Signed, Terrilee Croak, MD [facility] 09/07/2021

## 2021-09-08 DIAGNOSIS — R071 Chest pain on breathing: Secondary | ICD-10-CM | POA: Diagnosis not present

## 2021-09-08 MED ORDER — OXYCODONE HCL ER 20 MG PO T12A
40.0000 mg | EXTENDED_RELEASE_TABLET | Freq: Two times a day (BID) | ORAL | Status: DC
  Administered 2021-09-08 – 2021-09-17 (×18): 40 mg via ORAL
  Filled 2021-09-08 (×18): qty 2

## 2021-09-08 NOTE — Plan of Care (Signed)
  Problem: Activity: Goal: Risk for activity intolerance will decrease Outcome: Progressing   Problem: Pain Managment: Goal: General experience of comfort will improve Outcome: Progressing   

## 2021-09-08 NOTE — Progress Notes (Signed)
PROGRESS NOTE  [name]  DOB: [birth year]  PCP: [name] PFY:[id number]  DOA: 09/20/2021  LOS: 4 days  Hospital Day: 6  Brief narrative: [name] is a 56 y.o. 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 At home, patient continued to have pleuritic chest pain despite short acting and long-acting pain medicines, unable to perform ADLs and hence he returned to the ED on 7/11.  In the ED, patient was afebrile, tachycardic to 130, blood pressure 142/73, breathing on room air at rest. EKG showed sinus tachycardia at the rate of 118 bpm. Labs showed WC count elevated 17.3, hemoglobin low at 9.6, BUN/creatinine elevated at 22/0.64, alk phos elevated to 426, troponin normal. Chest x-ray with stable right hilar/suprahilar mass and stable right basilar opacity.  Patient was started on IV pain medicine Admitted to hospitalist service  Subjective: Patient was seen and examined this morning. Propped up in bed.  Pain partially controlled.  Accepted to use fentanyl patch yesterday.  Also requiring multiple doses of Dilaudid daily. To start radiation treatment from tomorrow.  Assessment and plan: Intractable pleuritic chest pain  -Continue chest pain.  Currently on fentanyl patch 12 mcg/h since yesterday.  Increase OxyContin to 40 mg twice daily, Neurontin 200 mg twice daily, as needed Percocet and as needed IV Dilaudid.  -7/14, radiation oncology plans to start on radiation starting Monday 7/17. -At risk of constipation because of additive use of opioids.  7/15, KUB without a stool burden.  Continue bowel regimen    Metastatic adenocarcinoma  -Patient has history of lung cancer requiring right upper lobe lung wedge resection while in [place] in 2020.  Per care everywhere, pathology at that time showed moderately differentiated adenocarcinoma of the  lung. -6/29, along with pulm embolism, CT angio of chest also showed large right suprahilar mass extending contiguously into the mediastinum and measuring about 6.5 cm maximal diameter. Malignant lesions were also seen throughout the liver, in the tail of the pancreas, in the left anterior fifth rib and multiple vertebral bodies as well as in the left breast tissue and pancreas. The primary lesion is uncertain and could represent the lung mass which is the largest, the breast lesions, or the pancreatic lesion.  -During her hospitalization, hospitalist discussed the case with oncologist Dr. Julien Michael.  Patient was suspected to have recurrence of lung cancer.  An appointment has been scheduled with Dr. Julien Michael for 09/11/21.  Recent pulmonary embolism  -Bilateral PE diagnosed on 08/22/21. Continue Eliquis. -No longer requiring supplemental oxygen.  Check ambulatory oxygen requirement.  Left arm swelling -Reported on 7/13.   -7/14, CT left humerus was obtained which showed likely changes along the posterior aspect of the glenoid neck with overlying soft tissue mass centered within the left infraspinatus muscle measuring up to 3.7 cm.  Findings most suggestive of tissue metastatic lesion with associated bony abnormality.  There is also an intramuscular metastasis in the biceps muscle measuring 6.2 cm.  There are also multiple soft tissue masses along the left chest wall and within the left breast soft tissues consistent with metastatic disease  Sinus tachycardia -Heart rate remains consistently over 100 at rest. -Related to pain, hypoxia as well as recent PE -To prevent tachycardia induced cardiomyopathy, I started her on low-dose metoprolol at 12.5 mg twice daily.  Goals of care   Code Status: Full Code    Mobility: Encourage ambulation  Skin  assessment:     Nutritional status:  Body mass index is 27.32 kg/m.          Diet:  Diet Order             Diet regular Room service appropriate?  Yes; Fluid consistency: Thin  Diet effective now                   DVT prophylaxis:   apixaban (ELIQUIS) tablet 5 mg   Antimicrobials: None Fluid: None Consultants: None Family Communication: None at bedside  Status is: Observation  Continue in-hospital care because: Continues to need IV pain medicines.  Pending radiation from Monday. Level of care: Telemetry   Dispo: The patient is from: Home              Anticipated d/c is to: Home              Patient currently is not medically stable to d/c. Difficult to place patient No     Infusions:    Scheduled Meds:  apixaban  5 mg Oral BID   fentaNYL  1 patch Transdermal Q72H   gabapentin  200 mg Oral BID   latanoprost  1 drop Both Eyes QHS   lidocaine  1 patch Transdermal Q24H   metoprolol tartrate  12.5 mg Oral BID   oxyCODONE  30 mg Oral Q12H   senna-docusate  1 tablet Oral QHS   sodium chloride flush  3 mL Intravenous Q12H   traZODone  100 mg Oral QHS    PRN meds: acetaminophen **OR** acetaminophen, bisacodyl, HYDROmorphone (DILAUDID) injection, magnesium hydroxide, ondansetron **OR** ondansetron (ZOFRAN) IV, mouth rinse, oxyCODONE-acetaminophen, polyethylene glycol   Antimicrobials: Anti-infectives (From admission, onward)    None       Objective: Vitals:   09/07/21 2005 09/08/21 0854  BP:  116/67  Pulse:  (!) 109  Resp: 15   Temp:    SpO2:      Intake/Output Summary (Last 24 hours) at 09/08/2021 1347 Last data filed at 09/08/2021 0900 Gross per 24 hour  Intake 120 ml  Output 300 ml  Net -180 ml    Filed Weights   09/11/2021 2130  Weight: 83.9 kg   Weight change:  Body mass index is 27.32 kg/m.   Physical Exam: General exam: Pleasant, middle-aged African-American female.  Not in distress at rest  skin: No rashes, lesions or ulcers. HEENT: Atraumatic, normocephalic, no obvious bleeding Lungs: Diminished air entry in both bases.  Multiple tender points anteriorly and posteriorly CVS:  Tachycardic, regular rhythm, no murmur GI/Abd soft, nontender, nondistended, bowel sound present CNS: Alert, awake, oriented x3 Psychiatry: Mood appropriate Extremities: No pedal edema, no calf tenderness.  Left arm swelling persists  Data Review: I have personally reviewed the laboratory data and studies available.  F/u labs ordered Unresulted Labs (From admission, onward)    None       Signed, Terrilee Croak, MD [facility] 09/08/2021

## 2021-09-08 NOTE — Progress Notes (Signed)
  Radiation Oncology         [phone] ________________________________  Name: [name] MRN: [id number]  Date: 09/06/2021  DOB: February 17, 1966   INPATIENT  SIMULATION AND TREATMENT PLANNING NOTE    ICD-10-CM   1. 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  Identity was confirmed.  All relevant records and images related to the planned course of therapy were reviewed.  The patient freely provided informed written consent to proceed with treatment after reviewing the details related to the planned course of therapy. The consent form was witnessed and verified by the simulation staff.  Then, the patient was set-up in a stable reproducible  supine position for radiation therapy.  CT images were obtained.  Surface markings were placed.  The CT images were loaded into the planning software.  Then the target and avoidance structures were contoured.  Treatment planning then occurred.  The radiation prescription was entered and confirmed.  Then, I designed and supervised the construction of a total of multiple medically necessary complex treatment devices documented in the radiation plan including positioning devices and MLC's to shield lungs and heart.  I have requested : 3D Simulation  I have requested a DVH of the following structures: lungs, heart, spinal cord and targets.  PLAN:  The patient will receive 30 Gy in 10 fractions to the central mediastinal nodes, T11 and the left 5th rib.  ________________________________  Sheral Apley [name], M.D.

## 2021-09-09 ENCOUNTER — Ambulatory Visit
Admit: 2021-09-09 | Discharge: 2021-09-09 | Disposition: A | Discharge status: Home / Self Care | Payer: BLUE CROSS/BLUE SHIELD | Attending: Radiation Oncology | Admitting: Radiation Oncology

## 2021-09-09 ENCOUNTER — Other Ambulatory Visit: Payer: Self-pay

## 2021-09-09 DIAGNOSIS — R071 Chest pain on breathing: Secondary | ICD-10-CM | POA: Diagnosis not present

## 2021-09-09 LAB — RAD ONC ARIA SESSION SUMMARY

## 2021-09-09 LAB — LACTIC ACID, PLASMA
Lactic Acid, Venous: 4.1 mmol/L (ref 0.5–1.9)
Lactic Acid, Venous: 3.7 mmol/L (ref 0.5–1.9)

## 2021-09-09 LAB — TSH: TSH: 0.545 u[IU]/mL (ref 0.350–4.500)

## 2021-09-09 MED ORDER — METOPROLOL TARTRATE 25 MG PO TABS
25.0000 mg | ORAL_TABLET | Freq: Two times a day (BID) | ORAL | Status: DC
  Administered 2021-09-09: 25 mg via ORAL
  Filled 2021-09-09: qty 1

## 2021-09-09 MED ORDER — FENTANYL 25 MCG/HR TD PT72
1.0000 | MEDICATED_PATCH | TRANSDERMAL | Status: DC
  Administered 2021-09-10 – 2021-09-13 (×2): 1 via TRANSDERMAL
  Filled 2021-09-09 (×4): qty 1

## 2021-09-09 MED ORDER — METOPROLOL TARTRATE 25 MG PO TABS
12.5000 mg | ORAL_TABLET | Freq: Once | ORAL | Status: AC
  Administered 2021-09-09: 12.5 mg via ORAL
  Filled 2021-09-09: qty 1

## 2021-09-09 NOTE — Progress Notes (Signed)
The patient ambulated 90 feet, activity tolerated well. HR increased 136 SR. The pt denied SOB, CP or discomfort. Will continue to monitor.

## 2021-09-09 NOTE — Progress Notes (Signed)
The patient request that Fentanyl patch application be moved to Tuesday 09/10/21, last administered on 07/15. Pharmacy updated.

## 2021-09-09 NOTE — TOC Progression Note (Addendum)
Transition of Care [facility]) - Progression Note    Patient Details  Name: [name] MRN: [id number] Date of Birth: 1966-01-21  Transition of [facility]) CM/SW Contact  Ross Ludwig, [place] Phone Number: 09/09/2021, 9:40 PM  Clinical Narrative:     CSW spoke to patient's sister [name] regarding patient's disposition plan.  Per [name], patient was working in [place], but is currently on FMLA due to having cancer.  Patient is living with her sister [name].  Per [name] patient has oxygen through [employer] already, but she will need a hospital bed, bedside commode and rolling walker once she is ready for discharge.  CSW explained to her that once the orders are put in, CSW can contact [employer] to get equipment delivered to the house prior to her discharge.  [name] stated she would like to get the DME from [facility] as well.    [name] requested home health if possible, CSW informed her that not many agencies will take [employer], but CSW can try to find an agency that will accept.  They not have preference for agencies.    Patient's sister also asked if there is any financial assistance available for medical bills.  CSW informed her that once patient starts going to the [facility], they have a Education officer, museum who can see what resources are available.  Patient does not have a PCP, per patient's daughter they would like to get a after hospital appointment scheduled with [facility], to get established with a  PCP.  CSW will give information to [facility] member covering tomorrow so she can follow up with [employer] and order the equipment.  Patient's sister was very appreciative of information given.  Expected Discharge Plan: Home/Self Care Barriers to Discharge: Continued Medical Work up  Expected Discharge Plan and Services Expected Discharge Plan: Home/Self Care   Discharge Planning Services: CM Consult   Living arrangements for the past 2 months: Single Family Home                                        Social Determinants of Health (SDOH) Interventions    Readmission Risk Interventions     No data to display

## 2021-09-09 NOTE — Progress Notes (Signed)
   09/09/21 0505  Assess: MEWS Score  Temp 99.8 F (37.7 C)  BP 137/75  MAP (mmHg) 91  Pulse Rate (!) 128  Resp 19  SpO2 98 %  O2 Device Nasal Cannula  O2 Flow Rate (L/min) 2 L/min  Assess: MEWS Score  MEWS Temp 0  MEWS Systolic 0  MEWS Pulse 2  MEWS RR 0  MEWS LOC 0  MEWS Score 2  MEWS Score Color Yellow  Assess: if the MEWS score is Yellow or Red  Were vital signs taken at a resting state? Yes  Focused Assessment Change from prior assessment (see assessment flowsheet)  Does the patient meet 2 or more of the SIRS criteria? No  MEWS guidelines implemented *See Row Information* Yes  Treat  MEWS Interventions Escalated (See documentation below)  Pain Scale 0-10  Pain Score 7  Pain Type Chronic pain  Pain Location Back  Pain Orientation Mid;Lower  Pain Descriptors / Indicators Aching;Discomfort  Pain Frequency Constant  Pain Onset On-going  Patients Stated Pain Goal 2  Pain Intervention(s) Medication (See eMAR)  Multiple Pain Sites No  Breathing 0  Negative Vocalization 0  Facial Expression 0  Body Language 1  Consolability 0  PAINAD Score 1  Complains of Anxiety  Neuro symptoms relieved by Rest  Constipation interventions Other (Comment)  Nausea relieved by Nothing  Early Detection of Sepsis Score *See Row Information* Low  Take Vital Signs  Increase Vital Sign Frequency  Yellow: Q 2hr X 2 then Q 4hr X 2, if remains yellow, continue Q 4hrs  Escalate  MEWS: Escalate Yellow: discuss with charge nurse/RN and consider discussing with provider and RRT  Notify: Charge Nurse/RN  Name of Charge Nurse/RN Notified Pam RN  Date Charge Nurse/RN Notified 09/09/21  Time Charge Nurse/RN Notified [id number]  Notify: Provider  Provider Name/Title Gean Birchwood, MD  Date Provider Notified 09/09/21  Time Provider Notified [phone]  Method of Notification Page  Notification Reason Change in status  Provider response See new orders  Date of Provider Response 09/09/21  Time of  Provider Response [phone]  Assess: SIRS CRITERIA  SIRS Temperature  0  SIRS Pulse 1  SIRS Respirations  0  SIRS WBC 0  SIRS Score Sum  1

## 2021-09-09 NOTE — Progress Notes (Signed)
PROGRESS NOTE  [name]  DOB: 11-10-1965  PCP: Kathyrn Lass AJG:[id number]  DOA: 09/07/2021  LOS: 5 days  Hospital Day: 7  Brief narrative: [name] is a 56 y.o. 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 At home, patient continued to have pleuritic chest pain despite short acting and long-acting pain medicines, unable to perform ADLs and hence he returned to the ED on 7/11.  In the ED, patient was afebrile, tachycardic to 130, blood pressure 142/73, breathing on room air at rest. EKG showed sinus tachycardia at the rate of 118 bpm. Labs showed WC count elevated 17.3, hemoglobin low at 9.6, BUN/creatinine elevated at 22/0.64, alk phos elevated to 426, troponin normal. Chest x-ray with stable right hilar/suprahilar mass and stable right basilar opacity.  Patient was started on IV pain medicine Admitted to hospitalist service  Subjective: Patient was seen and examined this morning. Middle-aged African-American female.  Propped up in bed.  Not in distress at rest.  But remains tachycardic, oxygen dependent.  Still dependent on significant amount of pain medicine to control the pain. To start radiation treatment from today.  Assessment and plan: Intractable pleuritic chest pain  -Continue chest pain.  Currently on fentanyl patch 12 mcg/h, OxyContin to 40 mg twice daily, Neurontin 200 mg twice daily, as needed Percocet and as needed IV Dilaudid.  Increase the dose of next fentanyl patch to be applied tomorrow to 25 mcg/hr. -7/14, radiation oncology plans to start on radiation starting Monday 7/17. -I expect that the level of pain will improve after a few sessions of radiation and she should be able to complete the rest as an outpatient. -At risk of constipation because of additive use of opioids.  7/15, KUB without a stool burden.  Continue bowel  regimen.  Recent pulmonary embolism  -Bilateral PE diagnosed on 08/22/21. Continue Eliquis. -No longer requiring supplemental oxygen.  Check ambulatory oxygen requirement.    Metastatic adenocarcinoma  -Patient has history of lung cancer requiring right upper lobe lung wedge resection while in [place] in 2020.  Per care everywhere, pathology at that time showed moderately differentiated adenocarcinoma of the lung. -6/29, along with pulm embolism, CT angio of chest also showed large right suprahilar mass extending contiguously into the mediastinum and measuring about 6.5 cm maximal diameter. Malignant lesions were also seen throughout the liver, in the tail of the pancreas, in the left anterior fifth rib and multiple vertebral bodies as well as in the left breast tissue and pancreas. The primary lesion is uncertain and could represent the lung mass which is the largest, the breast lesions, or the pancreatic lesion.  -During her hospitalization, hospitalist discussed the case with oncologist Dr. Julien Nordmann.  Patient was suspected to have recurrence of lung cancer.  An appointment has been scheduled with Dr. Julien Nordmann for 09/11/21.  Left arm swelling -Reported on 7/13.   -7/14, CT left humerus was obtained which showed likely changes along the posterior aspect of the glenoid neck with overlying soft tissue mass centered within the left infraspinatus muscle measuring up to 3.7 cm.  Findings most suggestive of tissue metastatic lesion with associated bony abnormality.  There is also an intramuscular metastasis in the biceps muscle measuring 6.2 cm.  There are also multiple soft tissue masses along the left chest wall and within the left breast soft tissues consistent with metastatic disease  Sinus tachycardia -Heart rate remains consistently  over 100 at rest. -Related to pain, hypoxia as well as recent PE -To prevent tachycardia induced cardiomyopathy, I started her on low-dose metoprolol at 12.5 mg twice  daily.  Continue to monitor  Goals of care   Code Status: Full Code    Mobility: Encourage ambulation  Skin assessment:     Nutritional status:  Body mass index is 27.32 kg/m.          Diet:  Diet Order             Diet regular Room service appropriate? Yes; Fluid consistency: Thin  Diet effective now                   DVT prophylaxis:   apixaban (ELIQUIS) tablet 5 mg   Antimicrobials: None Fluid: None Consultants: None Family Communication: Sister at bedside  Status is: Observation  Continue in-hospital care because: Continues to need IV pain medicines.  Radiation treatment started from today. Level of care: Telemetry   Dispo: The patient is from: Home              Anticipated d/c is to: Home              Patient currently is not medically stable to d/c. Difficult to place patient No     Infusions:    Scheduled Meds:  apixaban  5 mg Oral BID   [START ON 09/10/2021] fentaNYL  1 patch Transdermal Q72H   gabapentin  200 mg Oral BID   latanoprost  1 drop Both Eyes QHS   lidocaine  1 patch Transdermal Q24H   metoprolol tartrate  25 mg Oral BID   oxyCODONE  40 mg Oral Q12H   senna-docusate  1 tablet Oral QHS   sodium chloride flush  3 mL Intravenous Q12H   traZODone  100 mg Oral QHS    PRN meds: acetaminophen **OR** acetaminophen, bisacodyl, HYDROmorphone (DILAUDID) injection, magnesium hydroxide, ondansetron **OR** ondansetron (ZOFRAN) IV, mouth rinse, oxyCODONE-acetaminophen, polyethylene glycol   Antimicrobials: Anti-infectives (From admission, onward)    None       Objective: Vitals:   09/09/21 0821 09/09/21 1352  BP: 121/65 116/77  Pulse: (!) 119 (!) 106  Resp: 18 16  Temp: 99 F (37.2 C) 98.4 F (36.9 C)  SpO2: 99% 100%    Intake/Output Summary (Last 24 hours) at 09/09/2021 1602 Last data filed at 09/09/2021 1530 Gross per 24 hour  Intake 600 ml  Output --  Net 600 ml    Filed Weights   09/06/2021 2130  Weight: 83.9 kg    Weight change:  Body mass index is 27.32 kg/m.   Physical Exam: General exam: Pleasant, middle-aged African-American female.  Not in distress at rest  skin: No rashes, lesions or ulcers. HEENT: Atraumatic, normocephalic, no obvious bleeding Lungs: Diminished air entry in both bases.  Multiple tender points anteriorly and posteriorly CVS: Tachycardic, regular rhythm, no murmur GI/Abd soft, nontender, nondistended, bowel sound present CNS: Alert, awake, oriented x3 Psychiatry: Mood appropriate Extremities: No pedal edema, no calf tenderness.  Left arm swelling persists  Data Review: I have personally reviewed the laboratory data and studies available.  F/u labs ordered Unresulted Labs (From admission, onward)    None       Signed, Terrilee Croak, MD [facility] 09/09/2021

## 2021-09-10 ENCOUNTER — Ambulatory Visit
Admit: 2021-09-10 | Discharge: 2021-09-10 | Disposition: A | Discharge status: Home / Self Care | Payer: BLUE CROSS/BLUE SHIELD | Attending: Radiation Oncology | Admitting: Radiation Oncology

## 2021-09-10 ENCOUNTER — Other Ambulatory Visit: Payer: Self-pay

## 2021-09-10 DIAGNOSIS — G893 Neoplasm related pain (acute) (chronic): Secondary | ICD-10-CM

## 2021-09-10 DIAGNOSIS — E872 Acidosis, unspecified: Secondary | ICD-10-CM

## 2021-09-10 DIAGNOSIS — I2699 Other pulmonary embolism without acute cor pulmonale: Secondary | ICD-10-CM | POA: Diagnosis not present

## 2021-09-10 DIAGNOSIS — C7989 Secondary malignant neoplasm of other specified sites: Secondary | ICD-10-CM | POA: Diagnosis not present

## 2021-09-10 DIAGNOSIS — C3411 Malignant neoplasm of upper lobe, right bronchus or lung: Secondary | ICD-10-CM | POA: Diagnosis not present

## 2021-09-10 DIAGNOSIS — R0789 Other chest pain: Secondary | ICD-10-CM | POA: Diagnosis not present

## 2021-09-10 DIAGNOSIS — R52 Pain, unspecified: Secondary | ICD-10-CM

## 2021-09-10 DIAGNOSIS — C799 Secondary malignant neoplasm of unspecified site: Secondary | ICD-10-CM

## 2021-09-10 LAB — RAD ONC ARIA SESSION SUMMARY
Course Elapsed Days: 1
Plan Fractions Treated to Date: 1
Plan Fractions Treated to Date: 2
Plan Prescribed Dose Per Fraction: 3 Gy
Plan Prescribed Dose Per Fraction: 3 Gy
Plan Total Fractions Prescribed: 10
Plan Total Fractions Prescribed: 9
Plan Total Prescribed Dose: 27 Gy
Plan Total Prescribed Dose: 30 Gy
Reference Point Dosage Given to Date: 6 Gy
Reference Point Dosage Given to Date: 6 Gy
Reference Point Session Dosage Given: 3 Gy
Reference Point Session Dosage Given: 3 Gy
Session Number: 2

## 2021-09-10 MED ORDER — POLYETHYLENE GLYCOL 3350 17 G PO PACK: 17.0000 g | PACK | Freq: Two times a day (BID) | ORAL | Status: DC | PRN

## 2021-09-10 MED ORDER — METOPROLOL TARTRATE 50 MG PO TABS
50.0000 mg | ORAL_TABLET | Freq: Two times a day (BID) | ORAL | Status: DC
  Administered 2021-09-10 – 2021-10-01 (×42): 50 mg via ORAL
  Filled 2021-09-10 (×43): qty 1

## 2021-09-10 MED ORDER — SENNOSIDES-DOCUSATE SODIUM 8.6-50 MG PO TABS: 1.0000 | ORAL_TABLET | Freq: Two times a day (BID) | ORAL | Status: DC | PRN

## 2021-09-10 NOTE — Progress Notes (Signed)
PT Cancellation Note  Patient Details Name: [name] MRN: [id number] DOB: 1965-04-06   Cancelled Treatment:    Reason Eval/Treat Not Completed:  Attempted PT tx session-pt declined to participate at this time. Will check back as schedule allows.    [facility]  Office: [phone] Pager: [phone]

## 2021-09-10 NOTE — Progress Notes (Signed)
PT Cancellation Note  Patient Details Name: [name] MRN: [id number] DOB: 1965/12/25   Cancelled Treatment:    Reason Eval/Treat Not Completed: Patient at procedure or test/unavailable. Will check back another day.     [facility]  Office: [phone] Pager: [phone]

## 2021-09-10 NOTE — TOC Progression Note (Signed)
Transition of Care [facility]) - Progression Note    Patient Details  Name: [name] MRN: [id number] Date of Birth: 1965-04-17  Transition of Care [facility] CM/SW Contact  Lennart Pall, LCSW Phone Number: 09/10/2021, 2:33 PM  Clinical Narrative:    TOC has been able to secure a new PCP appt for pt at [facility] on 09/17/21 @ 9:15am with  Dr. Angelique Blonder - info on the AVS.  Continue to follow for [facility] and DME needs.   Expected Discharge Plan: Home/Self Care Barriers to Discharge: Continued Medical Work up  Expected Discharge Plan and Services Expected Discharge Plan: Home/Self Care   Discharge Planning Services: CM Consult   Living arrangements for the past 2 months: Single Family Home                                       Social Determinants of Health (SDOH) Interventions    Readmission Risk Interventions    09/10/2021    2:32 PM  Readmission Risk Prevention Plan  Transportation Screening Complete  PCP or Specialist Appt within 5-7 Days Complete  Home Care Screening Complete  Medication Review (RN CM) Complete

## 2021-09-10 NOTE — Progress Notes (Signed)
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  In ED, afebrile but tachycardic.  WBC 17.3 with left shift.  Hgb 9.6.  ALP elevated to 426.  CXR with stable right healer and suprahilar mass and a stable right basilar opacity.  Patient was started on IV pain medication and admitted for pain control.    Radiation oncology consulted, and patient started radiation treatment on 7/17.  Subjective: Seen and examined earlier this morning.  No major events overnight of this morning.  Continues to endorse significant pain in both shoulders and chest.  Currently her pain is 4/10 but she is anxious that she could not handle radiation treatment without IV Dilaudid.   Objective: Vitals:   09/09/21 2007 09/10/21 0432 09/10/21 0700 09/10/21 1242  BP: 118/61 139/78 126/79 115/67  Pulse: (!) 116 (!) 119 (!) 131 (!) 112  Resp: 16 18 18 18   Temp: 98.5 F (36.9 C) 98.5 F (36.9 C) 98.5 F (36.9 C) 99.1 F (37.3 C)  TempSrc: Oral Oral Oral Oral  SpO2: 99% 96% 98% 98%  Weight:      Height:        Examination:  GENERAL: No apparent distress.  Nontoxic. HEENT: MMM.  Vision and hearing grossly intact.  NECK: Supple.  No apparent JVD.  RESP:  No IWOB.  Fair aeration bilaterally. CVS:  RRR. Heart sounds normal.  ABD/GI/GU: BS+. Abd soft, NTND.  MSK/EXT:  Moves extremities. No apparent deformity. No edema.  SKIN: no apparent skin lesion or wound NEURO: Awake, alert and oriented appropriately.  No apparent focal neuro deficit. PSYCH: Calm. Normal affect.    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  Patient had RUL lung wedge resection with clear margins in [place] in 2020.  Pathology at that time showed moderately differentiated adenocarcinoma of the lung.  CT angio chest showed large right suprahilar mass extending continuously into mediastinum and measuring about 6.3 cm maximal diameter with mets to liver, tail of pancreas, left anterior fifth ribs and multiple vertebral bodies as well as the left breast tissue.  Patient has an upcoming appointment with Dr. Julien Nordmann on 09/10/2021.  Unfortunately, pain control has been a huge challenge requiring IV Dilaudid. -Radiation oncology started radiation on 7/17. -Has upcoming appointment with Dr. Julien Nordmann on 7/18 but may not leave hospital due to pain. -We will consult oncology to see if they can see her in house  Cancer related pain: Continues to endorse significant pain.  Also reports difficulty lying flat due to severe back pain. -Continue OxyContin 40 mg twice daily and Neurontin 20 mg twice daily -Increase fentanyl patch to 25 mcg every 72 hours. -Continue as needed Percocet for moderate pain and IV  Dilaudid for severe pain -I have encouraged patient to avoid IV pain medication if possible. -Aggressive bowel regimen -Ordered hospital bed -Consult palliative care and oncology   Recent pulmonary embolism:bilateral PE diagnosed on 08/22/21.  -Continue Eliquis.   Left arm swelling: reported on 7/13.  CT left humerus on 7/14 showed 3.7 cm left infraspinatus muscle, 6.2 cm left biceps muscle and other multiple soft tissue masses along the left chest  wall and within the left breast soft tissues consistent with metastatic disease   Sinus tachycardia: Likely due to pain and underlying PE. -Increase metoprolol to 50 mg twice daily  Physical deconditioning -Therapy recommended home health.  Will add rolling walker, bedside commode and hospital bed  Lactic acidosis: Likely type B.  Body mass index is 27.32 kg/m.   DVT prophylaxis:   apixaban (ELIQUIS) tablet 5 mg  Code Status: Full code Family Communication: Updated patient's sister and niece at bedside Level of care: Telemetry Status is: Inpatient Remains inpatient appropriate because: Intractable cancer related pain   Final disposition: Likely home Consultants:  Radiation oncology Palliative medicine  Sch Meds:  Scheduled Meds:  apixaban  5 mg Oral BID   fentaNYL  1 patch Transdermal Q72H   gabapentin  200 mg Oral BID   latanoprost  1 drop Both Eyes QHS   lidocaine  1 patch Transdermal Q24H   metoprolol tartrate  50 mg Oral BID   oxyCODONE  40 mg Oral Q12H   sodium chloride flush  3 mL Intravenous Q12H   traZODone  100 mg Oral QHS   Continuous Infusions: PRN Meds:.acetaminophen **OR** acetaminophen, bisacodyl, HYDROmorphone (DILAUDID) injection, magnesium hydroxide, ondansetron **OR** ondansetron (ZOFRAN) IV, mouth rinse, oxyCODONE-acetaminophen, polyethylene glycol, senna-docusate  Antimicrobials: Anti-infectives (From admission, onward)    None        I have personally reviewed the following labs and images: CBC: Recent Labs  Lab 09/17/2021 2149 09/04/21 0500 09/06/21 0423  WBC 17.3* 16.1* 15.6*  NEUTROABS 13.0*  --  11.3*  HGB 9.6* 8.9* 9.1*  HCT 30.5* 28.7* 29.9*  MCV 76.8* 77.6* 78.3*  PLT 557* 503* 508*   BMP &GFR Recent Labs  Lab 09/21/2021 2149 09/04/21 0500 09/06/21 0423  NA 136 137 141  K 5.0 4.3 4.2  CL 98 99 101  CO2 24 28 28   GLUCOSE 148* 106* 123*  BUN 22* 18 16  CREATININE 0.64 0.52 0.59  CALCIUM 9.1 9.0 9.3  MG  --  2.2  --     Estimated Creatinine Clearance: 88.7 mL/min (by C-G formula based on SCr of 0.59 mg/dL). Liver & Pancreas: Recent Labs  Lab 09/13/2021 2149 09/04/21 0500  AST 53* 39  ALT 36 28  ALKPHOS 426* 391*  BILITOT 0.8 0.6  PROT 7.6 6.9  ALBUMIN 2.9* 2.7*   No results for input(s): "LIPASE", "AMYLASE" in the last 168 hours. No results for input(s): "AMMONIA" in the last 168 hours. Diabetic: No results for input(s): "HGBA1C" in the last 72 hours. No results for input(s): "GLUCAP" in the last 168 hours. Cardiac Enzymes: No results for input(s): "CKTOTAL", "CKMB", "CKMBINDEX", "TROPONINI" in the last 168 hours. No results for input(s): "PROBNP" in the last 8760 hours. Coagulation Profile: No results for input(s): "INR", "PROTIME" in the last 168 hours. Thyroid Function Tests: Recent Labs    09/09/21 0701  TSH 0.545   Lipid Profile: No results for input(s): "CHOL", "HDL", "LDLCALC", "TRIG", "CHOLHDL", "LDLDIRECT" in the last 72 hours. Anemia Panel: No results for input(s): "VITAMINB12", "FOLATE", "FERRITIN", "TIBC", "  IRON", "RETICCTPCT" in the last 72 hours. Urine analysis:    Component Value Date/Time   COLORURINE YELLOW 02/14/2010 0600   APPEARANCEUR CLEAR 02/14/2010 0600   LABSPEC 1.028 02/14/2010 0600   PHURINE 5.5 02/14/2010 0600   GLUCOSEU NEGATIVE 02/14/2010 0600   HGBUR NEGATIVE 02/14/2010 0600   BILIRUBINUR LARGE (A) 02/14/2010 0600   KETONESUR NEGATIVE 02/14/2010 0600   PROTEINUR NEGATIVE 02/14/2010 0600   UROBILINOGEN 1.0 02/14/2010 0600   NITRITE NEGATIVE 02/14/2010 0600   LEUKOCYTESUR  02/14/2010 0600    NEGATIVE MICROSCOPIC NOT DONE ON URINES WITH NEGATIVE PROTEIN, BLOOD, LEUKOCYTES, NITRITE, OR GLUCOSE <1000 mg/dL.   Sepsis Labs: Invalid input(s): "PROCALCITONIN", "LACTICIDVEN"  Microbiology: No results found for this or any previous visit (from the past 240 hour(s)).  Radiology Studies: No results found.    Clytie Shetley T. Medora  If  7PM-7AM, please contact night-coverage [email or web address] 09/10/2021, 6:02 PM

## 2021-09-11 ENCOUNTER — Ambulatory Visit: Admit: 2021-09-11 | Payer: BLUE CROSS/BLUE SHIELD

## 2021-09-11 ENCOUNTER — Inpatient Hospital Stay: Payer: BLUE CROSS/BLUE SHIELD | Admitting: Internal Medicine

## 2021-09-11 ENCOUNTER — Inpatient Hospital Stay: Payer: BLUE CROSS/BLUE SHIELD

## 2021-09-11 DIAGNOSIS — I2782 Chronic pulmonary embolism: Secondary | ICD-10-CM

## 2021-09-11 DIAGNOSIS — C3411 Malignant neoplasm of upper lobe, right bronchus or lung: Secondary | ICD-10-CM | POA: Diagnosis not present

## 2021-09-11 DIAGNOSIS — C7800 Secondary malignant neoplasm of unspecified lung: Secondary | ICD-10-CM

## 2021-09-11 DIAGNOSIS — C787 Secondary malignant neoplasm of liver and intrahepatic bile duct: Secondary | ICD-10-CM | POA: Diagnosis not present

## 2021-09-11 DIAGNOSIS — E872 Acidosis, unspecified: Secondary | ICD-10-CM | POA: Diagnosis not present

## 2021-09-11 DIAGNOSIS — D638 Anemia in other chronic diseases classified elsewhere: Secondary | ICD-10-CM

## 2021-09-11 DIAGNOSIS — R0789 Other chest pain: Secondary | ICD-10-CM

## 2021-09-11 DIAGNOSIS — C7951 Secondary malignant neoplasm of bone: Secondary | ICD-10-CM

## 2021-09-11 DIAGNOSIS — C7981 Secondary malignant neoplasm of breast: Secondary | ICD-10-CM

## 2021-09-11 DIAGNOSIS — C7900 Secondary malignant neoplasm of unspecified kidney and renal pelvis: Secondary | ICD-10-CM

## 2021-09-11 DIAGNOSIS — I2699 Other pulmonary embolism without acute cor pulmonale: Secondary | ICD-10-CM | POA: Diagnosis not present

## 2021-09-11 DIAGNOSIS — R52 Pain, unspecified: Secondary | ICD-10-CM | POA: Diagnosis not present

## 2021-09-11 DIAGNOSIS — G893 Neoplasm related pain (acute) (chronic): Secondary | ICD-10-CM | POA: Diagnosis not present

## 2021-09-11 DIAGNOSIS — C781 Secondary malignant neoplasm of mediastinum: Secondary | ICD-10-CM

## 2021-09-11 DIAGNOSIS — Z7901 Long term (current) use of anticoagulants: Secondary | ICD-10-CM

## 2021-09-11 DIAGNOSIS — C7989 Secondary malignant neoplasm of other specified sites: Secondary | ICD-10-CM | POA: Diagnosis not present

## 2021-09-11 DIAGNOSIS — C7889 Secondary malignant neoplasm of other digestive organs: Secondary | ICD-10-CM

## 2021-09-11 DIAGNOSIS — D72825 Bandemia: Secondary | ICD-10-CM

## 2021-09-11 LAB — COMPREHENSIVE METABOLIC PANEL
ALT: 35 U/L (ref 0–44)
AST: 61 U/L — ABNORMAL HIGH (ref 15–41)
Albumin: 2.5 g/dL — ABNORMAL LOW (ref 3.5–5.0)
Alkaline Phosphatase: 481 U/L — ABNORMAL HIGH (ref 38–126)
Anion gap: 12 (ref 5–15)
BUN: 18 mg/dL (ref 6–20)
CO2: 28 mmol/L (ref 22–32)
Calcium: 9.4 mg/dL (ref 8.9–10.3)
Chloride: 100 mmol/L (ref 98–111)
Creatinine, Ser: 0.57 mg/dL (ref 0.44–1.00)
GFR, Estimated: >60 mL/min (ref >60)
Glucose, Bld: 95 mg/dL (ref 70–99)
Potassium: 4.8 mmol/L (ref 3.5–5.1)
Sodium: 140 mmol/L (ref 135–145)
Total Bilirubin: 0.8 mg/dL (ref 0.3–1.2)
Total Protein: 6.7 g/dL (ref 6.5–8.1)

## 2021-09-11 LAB — CK: Total CK: 41 U/L (ref 38–234)

## 2021-09-11 LAB — LACTIC ACID, PLASMA
Lactic Acid, Venous: 3.2 mmol/L (ref 0.5–1.9)
Lactic Acid, Venous: 3.4 mmol/L (ref 0.5–1.9)

## 2021-09-11 LAB — MAGNESIUM: Magnesium: 2.3 mg/dL (ref 1.7–2.4)

## 2021-09-11 LAB — CBC
HCT: 27.6 % — ABNORMAL LOW (ref 36.0–46.0)
Hemoglobin: 8.6 g/dL — ABNORMAL LOW (ref 12.0–15.0)
MCH: 23.9 pg — ABNORMAL LOW (ref 26.0–34.0)
MCHC: 31.2 g/dL (ref 30.0–36.0)
MCV: 76.7 fL — ABNORMAL LOW (ref 80.0–100.0)
Platelets: 417 10*3/uL — ABNORMAL HIGH (ref 150–400)
RBC: 3.6 MIL/uL — ABNORMAL LOW (ref 3.87–5.11)
RDW: 16.2 % — ABNORMAL HIGH (ref 11.5–15.5)
WBC: 17.8 10*3/uL — ABNORMAL HIGH (ref 4.0–10.5)
nRBC: 0.2 % (ref 0.0–0.2)

## 2021-09-11 LAB — PHOSPHORUS: Phosphorus: 3.7 mg/dL (ref 2.5–4.6)

## 2021-09-11 NOTE — Progress Notes (Signed)
   09/11/21 0553  Provider Notification  Provider Name/Title Ellison Hughs.  Date Provider Notified 09/11/21  Time Provider Notified [phone]  Method of Notification Page  Notification Reason Critical result  Test performed and critical result lactic acid 3.2  Date Critical Result Received 09/11/21  Time Critical Result Received 0549  Provider response No new orders  Date of Provider Response 09/11/21  Time of Provider Response [phone]

## 2021-09-11 NOTE — Consult Note (Signed)
[name]  REASON FOR CONSULTATION:  56 years old white female with metastatic carcinoma.  HPI [name] is a 56 y.o. female never smoker with past medical history significant for osteoarthritis, irritable bowel syndrome, cardiac murmur as well as over active bladder who was found in May 2020 to have a suspicious right upper lobe lung mass when the patient presented to her primary care provider complaining of bronchitis she had chest x-ray in April 2020 that showed the mass in the right upper lobe.  She had a PET scan that showed hypermetabolic activity in 3.1 cm right upper lobe mass.  The patient was referred to thoracic surgery and she underwent bronchoscopy followed by right VATS with right upper lobectomy and thoracic lymphadenopathy by Dr.Avedis Meneshian at [facility] in [place] on 07/08/2018.  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  The tumor invades into but not through the adjacent pleura.  Adenocarcinoma was present 1 mm from the staple line parenchymal margin of resection was evidence of lymphovascular invasion.  The final pathologic stage was pT2 a, pN0.  The tumor was sent to [facility] for molecular studies and it showed positive EGFR mutation according to the note from her oncologist but he did not indicate the type of the mutation whether its exon 19 deletion or exon 21 mutation.  The patient did not start adjuvant chemotherapy but she started adjuvant targeted therapy with osimertinib 80 mg p.o. daily since August 2020.  She tolerated that treatment well but in May 2023 she presented to the emergency department at the [facility] complaining of left chest and back pain.  She had CT scan of the chest on the same day and  that showed numerous hypodense masses throughout the liver consistent with metastatic disease.  There was a single nonenhancing cystic mass in the pancreatic tail concerning for metastatic disease giving the similar presentation in the liver.  On Jul 23, 2021 the patient had a PET scan and it showed highly metabolically active likely neoplasm in the right medial upper lung extending into the mediastinum with multiple probable hepatic, soft tissue and osseous metastasis including uptake in T11, T10, near the glenoid neck of the posterior scapula, and posterior sacral elements near S2, and left humeral diaphysis, left sixth rib, anterior to costochondral junction of the left fifth rib, right iliac bone and left axilla..  There was additional hypermetabolic lesions of uncertain origin in the upper pole of the right kidney and behind the right thyroid.  On August 05, 2021 the patient underwent ultrasound-guided core biopsy of one of the liver lesion by interventional radiology.  The final pathology showed microscopic less than 1 mm focus of metastatic adenocarcinoma.  The immunohistochemical staining of this area was positive for PAX8, CK7, CK20, CDX2 and negative for TTF-1-1, Napsin A and GATA3.  The pattern of immune O activity is somewhat nonspecific and not typical of primary adenocarcinoma of the lung.  Additional biopsies were recommended.  The patient decided to move to [place] to be close to her family members. She was admitted to the hospital with shortness of breath and back pain.  During her evaluation, she had CT angiogram of the chest on 08/22/2021 and that showed positive examination for bilateral segmental and subsegmental pulmonary emboli.  She  also has large right suprahilar mass extending continuously into the mediastinum and measuring about 6.5 cm in maximum diameter.  There were malignant lesions demonstrated throughout the liver, and the tail of the pancreas, in the left anterior fifth rib and  multiple vertebral bodies as well as the left breast tissue and pancreas.  The primary lesion is uncertain but could represent the lung mass which is the largest, the breast lesions or pancreatic lesions.  There was a small left pleural effusion with consolidation or atelectasis in the left lung base.  There was also small pericardial effusion.  She also had CT scan of the left humerus that showed lytic changes along the posterior aspect of the glenoid neck with overlying soft tissue mass centered within the left infraspinatus muscle measuring up to 3.7 cm.  Findings are most suggestive of soft tissue metastatic lesion with associated bony involvement.  There was additional intramuscular lesion within the mid biceps muscle measuring up to 6.2 cm also concerning for metastatic disease.  There are multiple soft tissue masses along the left chest wall and within the visualized left breast soft tissue compatible with metastatic disease. when seen today she has 2 of her sister at the bedside.  She continues to have significant back pain and she is currently undergoing palliative radiotherapy under the care of Dr. [name]. I was consulted to see the patient and give recommendation regarding her condition. Family history significant for mother and brother with cancer.  Father had heart disease.  Maternal aunt had breast cancer. The patient used to work as a Oncologist.  She has 2 children a son and daughter.  She has no history for smoking, alcohol or drug abuse.  HPI  Past Medical History:  Diagnosis Date   Allergy    Arthritis    Complication of anesthesia    No pertinent past medical history    PONV (postoperative nausea and vomiting)    nausea with dizziness    Past Surgical History:  Procedure Laterality Date   ABDOMINAL HYSTERECTOMY     CHOLECYSTECTOMY     ENDOMETRIAL ABLATION     KNEE ARTHROSCOPY     LAPAROSCOPIC ASSISTED VAGINAL HYSTERECTOMY  02/13/2011   Procedure: LAPAROSCOPIC  ASSISTED VAGINAL HYSTERECTOMY;  Surgeon: Olga Millers;  Location: [facility];  Service: Gynecology;  Laterality: N/A;   SALPINGOOPHORECTOMY  02/13/2011   Procedure: SALPINGO OOPHERECTOMY;  Surgeon: Olga Millers;  Location: [facility];  Service: Gynecology;  Laterality: Bilateral;   TONSILLECTOMY      Family History  Problem Relation Age of Onset   Cancer Mother    Heart disease Father    Cancer Brother    Mental illness Son    Cancer Paternal Aunt    Cancer Paternal Uncle    Cancer Maternal Aunt     Social History Social History   Tobacco Use   Smoking status: Never  Substance Use Topics   Alcohol use: Yes    Alcohol/week: 2.0 standard drinks of alcohol    Types: 2 Glasses of wine per week    Comment: occasionally   Drug use: No    Allergies  Allergen Reactions   Fish Allergy Hives   Penicillins Hives   Augmentin [Amoxicillin-Pot Clavulanate] Hives   Betadine [Povidone Iodine] Hives and Other (See Comments)    Bad hives, per pt   Shellfish-Derived Products Hives   Tetanus Toxoids Hives    Current Facility-Administered Medications  Medication Dose Route Frequency Provider Last Rate Last Admin  acetaminophen (TYLENOL) tablet 650 mg  650 mg Oral Q6H PRN Opyd, Ilene Qua, MD       Or   acetaminophen (TYLENOL) suppository 650 mg  650 mg Rectal Q6H PRN Opyd, Ilene Qua, MD       apixaban (ELIQUIS) tablet 5 mg  5 mg Oral BID Opyd, Ilene Qua, MD   5 mg at 09/11/21 0942   bisacodyl (DULCOLAX) EC tablet 5 mg  5 mg Oral Daily PRN Opyd, Ilene Qua, MD       fentaNYL (DURAGESIC) 25 MCG/HR 1 patch  1 patch Transdermal Q72H Dahal, Marlowe Aschoff, MD   1 patch at 09/10/21 0753   gabapentin (NEURONTIN) capsule 200 mg  200 mg Oral BID Vianne Bulls, MD   200 mg at 09/11/21 0942   HYDROmorphone (DILAUDID) injection 1 mg  1 mg Intravenous Q4H PRN Hollace Hayward K, NP   1 mg at 09/10/21 1359   latanoprost (XALATAN) 0.005 % ophthalmic solution 1 drop  1 drop Both Eyes QHS Opyd, Ilene Qua, MD   1  drop at 09/10/21 2205   lidocaine (LIDODERM) 5 % 1 patch  1 patch Transdermal Q24H Dahal, Marlowe Aschoff, MD   1 patch at 09/11/21 1252   magnesium hydroxide (MILK OF MAGNESIA) suspension 30 mL  30 mL Oral Daily PRN Opyd, Ilene Qua, MD       metoprolol tartrate (LOPRESSOR) tablet 50 mg  50 mg Oral BID Wendee Michael T, MD   50 mg at 09/11/21 0942   ondansetron (ZOFRAN) tablet 4 mg  4 mg Oral Q6H PRN Opyd, Ilene Qua, MD       Or   ondansetron (ZOFRAN) injection 4 mg  4 mg Intravenous Q6H PRN Opyd, Ilene Qua, MD       Oral care mouth rinse  15 mL Mouth Rinse PRN Dahal, Marlowe Aschoff, MD       oxyCODONE (OXYCONTIN) 12 hr tablet 40 mg  40 mg Oral Q12H Dahal, Marlowe Aschoff, MD   40 mg at 09/11/21 6962   oxyCODONE-acetaminophen (PERCOCET/ROXICET) 5-325 MG per tablet 1-2 tablet  1-2 tablet Oral Q4H PRN Opyd, Ilene Qua, MD   2 tablet at 09/11/21 1242   polyethylene glycol (MIRALAX / GLYCOLAX) packet 17 g  17 g Oral BID PRN Gonfa, Charlesetta Ivory, MD       senna-docusate (Senokot-S) tablet 1 tablet  1 tablet Oral BID PRN Mercy Riding, MD       sodium chloride flush (NS) 0.9 % injection 3 mL  3 mL Intravenous Q12H Opyd, Ilene Qua, MD   3 mL at 09/11/21 1245   traZODone (DESYREL) tablet 100 mg  100 mg Oral QHS Opyd, Ilene Qua, MD   100 mg at 09/10/21 2140    Review of Systems  Constitutional: positive for anorexia and fatigue Eyes: negative Ears, nose, mouth, throat, and face: negative Respiratory: positive for cough and dyspnea on exertion Cardiovascular: negative Gastrointestinal: negative Genitourinary:negative Integument/breast: negative Hematologic/lymphatic: negative Musculoskeletal:positive for back pain Neurological: positive for weakness Behavioral/Psych: negative Endocrine: negative Allergic/Immunologic: negative  Physical Exam  XBM:WUXLK, healthy, no distress, well nourished, and well developed SKIN: skin color, texture, turgor are normal, no rashes or significant lesions HEAD: Normocephalic, No masses, lesions,  tenderness or abnormalities EYES: normal, PERRLA, Conjunctiva are pink and non-injected EARS: External ears normal, Canals clear OROPHARYNX:no exudate, no erythema, and lips, buccal mucosa, and tongue normal  NECK: supple, no adenopathy, no JVD LYMPH:  no palpable lymphadenopathy, no hepatosplenomegaly BREAST:not examined LUNGS: coarse sounds heard, decreased  breath sounds HEART: regular rate & rhythm, no murmurs, and no gallops ABDOMEN:abdomen soft, non-tender, normal bowel sounds, and no masses or organomegaly BACK: Back symmetric, no curvature., No CVA tenderness EXTREMITIES:no joint deformities, effusion, or inflammation, no edema  NEURO: alert & oriented x 3 with fluent speech, no focal motor/sensory deficits  PERFORMANCE STATUS: ECOG 1  LABORATORY DATA: Lab Results  Component Value Date   WBC 17.8 (H) 09/11/2021   HGB 8.6 (L) 09/11/2021   HCT 27.6 (L) 09/11/2021   MCV 76.7 (L) 09/11/2021   PLT 417 (H) 09/11/2021    _0 @  RADIOGRAPHIC STUDIES: DG Abd Portable 1V  Result Date: 09/07/2021 CLINICAL DATA:  Constipation EXAM: PORTABLE ABDOMEN - 1 VIEW COMPARISON:  None Available. FINDINGS: 2 supine frontal views of the abdomen and pelvis are obtained. No bowel obstruction or ileus. Minimal stool within the cecum. No significant fecal retention. No masses or abnormal calcifications. No acute bony abnormalities. IMPRESSION: 1. Minimal retained stool within the cecum. No bowel obstruction or ileus. Electronically Signed   By: Randa Ngo M.D.   On: 09/07/2021 16:11   CT HUMERUS LEFT W CONTRAST  Result Date: 09/06/2021 CLINICAL DATA:  Left arm swelling.  History of lung cancer EXAM: CT OF THE UPPER LEFT EXTREMITY WITH CONTRAST TECHNIQUE: Multidetector CT imaging of the upper left extremity was performed according to the standard protocol following intravenous contrast administration. RADIATION DOSE REDUCTION: This exam was performed according to the departmental  dose-optimization program which includes automated exposure control, adjustment of the mA and/or kV according to patient size and/or use of iterative reconstruction technique. CONTRAST:  70m OMNIPAQUE IOHEXOL 300 MG/ML  SOLN COMPARISON:  Chest CT 08/22/2021 FINDINGS: Bones/Joint/Cartilage Lytic changes along the posterior aspect of the glenoid neck (series 3, images 22-24) with overlying soft tissue mass centered within the left infraspinatus muscle measuring approximately 3.7 x 2.5 x 3.6 cm (series 4, image 19; series 603, image 10). Osseous structures appear otherwise intact. No additional bone lesion is evident by CT. No acute fracture or dislocation. Ligaments Suboptimally assessed by CT. Muscles and Tendons Soft tissue mass centered in the left infraspinatus muscle, as above. Additional intramuscular lesion within the mid biceps muscle measuring approximately 6.2 x 2.5 x 3.0 cm (series 606, image 28; series 4, image 106). Soft tissues Multiple soft tissue masses are again seen along the left chest wall and within the visualized left breast soft tissues largest measuring 3.5 x 2.6 x 3.2 cm along the lateral left chest wall (series 4, image 98). Largest nodule visualized within the left breast soft tissues measures 1.7 x 1.5 cm (series 4, image 24). IMPRESSION: 1. Lytic changes along the posterior aspect of the glenoid neck with overlying soft tissue mass centered within the left infraspinatus muscle measuring up to 3.7 cm. Findings are most suggestive of soft tissue metastatic lesion with associated bony involvement. 2. Additional intramuscular lesion within the mid biceps muscle measuring up to 6.2 cm, also concerning for metastatic disease. 3. Multiple soft tissue masses along the left chest wall and within the visualized left breast soft tissues, compatible with metastatic disease. Electronically Signed   By: NDavina PokeD.O.   On: 09/06/2021 11:15   DG Shoulder Left  Result Date:  09/04/2021 CLINICAL DATA:  Left arm swelling. EXAM: LEFT SHOULDER - 2+ VIEW COMPARISON:  None Available. FINDINGS: There is no evidence of fracture or dislocation. There is no evidence of arthropathy or other focal bone abnormality. Soft tissues are unremarkable. IMPRESSION: Negative. Electronically Signed  By: Ronney Asters M.D.   On: 09/04/2021 19:38   DG Chest 2 View  Result Date: 09/01/2021 CLINICAL DATA:  Mid chest pain.  Lung cancer. EXAM: CHEST - 2 VIEW COMPARISON:  Chest CT 08/22/2021 FINDINGS: Right hilar/suprahilar mass appears grossly unchanged. There is some atelectasis and airspace disease in the left lung base which is unchanged. There is no pleural effusion or pneumothorax. Cardiac silhouette within normal limits. No acute fracture. IMPRESSION: 1. Stable right hilar/suprahilar mass. 2. Stable left basilar atelectasis/airspace disease. Electronically Signed   By: Ronney Asters M.D.   On: 09/05/2021 22:39   ECHOCARDIOGRAM COMPLETE  Result Date: 08/22/2021    ECHOCARDIOGRAM REPORT   Patient Name:   BIANA HAGGAR [facility] Date of Exam: 08/22/2021 Medical Rec #:  [id number]        Height:       69.0 in Accession #:    [id number]       Weight:       185.0 lb Date of Birth:  Dec 09, 1965         BSA:          1.999 m Patient Age:    33 years         BP:           120/80 mmHg Patient Gender: F                HR:           100 bpm. Exam Location:  Inpatient Procedure: 2D Echo, Cardiac Doppler and Color Doppler Indications:    Pulmonary embolism  History:        Patient has no prior history of Echocardiogram examinations.                 Signs/Symptoms:Chest Pain and Shortness of Breath.  Sonographer:    Joette Catching RCS Referring Phys: [id number] Dolliver  1. Left ventricular ejection fraction, by estimation, is 60 to 65%. The left ventricle has normal function. The left ventricle has no regional wall motion abnormalities. Left ventricular diastolic parameters are consistent with Grade I  diastolic dysfunction (impaired relaxation).  2. Right ventricular systolic function is normal. The right ventricular size is normal. There is moderately elevated pulmonary artery systolic pressure. The estimated right ventricular systolic pressure is 88.5 mmHg.  3. The mitral valve is normal in structure. No evidence of mitral valve regurgitation. No evidence of mitral stenosis.  4. The aortic valve is normal in structure. Aortic valve regurgitation is not visualized. No aortic stenosis is present.  5. The inferior vena cava is normal in size with <50% respiratory variability, suggesting right atrial pressure of 8 mmHg.  6. There is a small to moderate circumferential pericardial effusion present. The pericardial effusion measures 1.45cm at greatest diameter posteriorly.     There is no evidence of cardiac tamponade. FINDINGS  Left Ventricle: Left ventricular ejection fraction, by estimation, is 60 to 65%. The left ventricle has normal function. The left ventricle has no regional wall motion abnormalities. The left ventricular internal cavity size was normal in size. There is  no left ventricular hypertrophy. Left ventricular diastolic parameters are consistent with Grade I diastolic dysfunction (impaired relaxation). Normal left ventricular filling pressure. Right Ventricle: The right ventricular size is normal. No increase in right ventricular wall thickness. Right ventricular systolic function is normal. There is moderately elevated pulmonary artery systolic pressure. The tricuspid regurgitant velocity is 3.45 m/s, and with an assumed right atrial pressure of 8  mmHg, the estimated right ventricular systolic pressure is 65.7 mmHg. Left Atrium: Left atrial size was normal in size. Right Atrium: Right atrial size was normal in size. Pericardium: The pericardial effusion measures 1.45cm at greatest diameter posteriorly. A small pericardial effusion is present. The pericardial effusion is circumferential. There is no  evidence of cardiac tamponade. Mitral Valve: The mitral valve is normal in structure. No evidence of mitral valve regurgitation. No evidence of mitral valve stenosis. Tricuspid Valve: The tricuspid valve is normal in structure. Tricuspid valve regurgitation is mild . No evidence of tricuspid stenosis. Aortic Valve: The aortic valve is normal in structure. Aortic valve regurgitation is not visualized. No aortic stenosis is present. Aortic valve mean gradient measures 5.0 mmHg. Aortic valve peak gradient measures 8.6 mmHg. Aortic valve area, by VTI measures 2.81 cm. Pulmonic Valve: The pulmonic valve was normal in structure. Pulmonic valve regurgitation is not visualized. No evidence of pulmonic stenosis. Aorta: The aortic root is normal in size and structure. Venous: The inferior vena cava is normal in size with less than 50% respiratory variability, suggesting right atrial pressure of 8 mmHg. IAS/Shunts: No atrial level shunt detected by color flow Doppler.  LEFT VENTRICLE PLAX 2D LVIDd:         3.50 cm   Diastology LVIDs:         2.20 cm   LV e' medial:    7.18 cm/s LV PW:         1.00 cm   LV E/e' medial:  9.2 LV IVS:        0.90 cm   LV e' lateral:   10.60 cm/s LVOT diam:     2.10 cm   LV E/e' lateral: 6.2 LV SV:         73 LV SV Index:   37 LVOT Area:     3.46 cm  RIGHT VENTRICLE             IVC RV Basal diam:  3.10 cm     IVC diam: 1.70 cm RV Mid diam:    2.30 cm RV S prime:     14.20 cm/s TAPSE (M-mode): 2.0 cm LEFT ATRIUM             Index        RIGHT ATRIUM           Index LA diam:        2.70 cm 1.35 cm/m   RA Area:     10.70 cm LA Vol (A2C):   29.2 ml 14.61 ml/m  RA Volume:   23.00 ml  11.51 ml/m LA Vol (A4C):   22.8 ml 11.41 ml/m LA Biplane Vol: 27.1 ml 13.56 ml/m  AORTIC VALVE AV Area (Vmax):    3.04 cm AV Area (Vmean):   2.82 cm AV Area (VTI):     2.81 cm AV Vmax:           147.00 cm/s AV Vmean:          110.000 cm/s AV VTI:            0.261 m AV Peak Grad:      8.6 mmHg AV Mean Grad:       5.0 mmHg LVOT Vmax:         129.00 cm/s LVOT Vmean:        89.600 cm/s LVOT VTI:          0.212 m LVOT/AV VTI ratio: 0.81  AORTA Ao Root diam: 3.10  cm Ao Asc diam:  2.70 cm MITRAL VALVE               TRICUSPID VALVE MV Area (PHT): 9.14 cm    TR Peak grad:   47.6 mmHg MV Decel Time: 83 msec     TR Vmax:        345.00 cm/s MV E velocity: 66.00 cm/s MV A velocity: 91.30 cm/s  SHUNTS MV E/A ratio:  0.72        Systemic VTI:  0.21 m                            Systemic Diam: 2.10 cm Fransico Him MD Electronically signed by Fransico Him MD Signature Date/Time: 08/22/2021/10:10:00 AM    Final    CT Angio Chest PE W/Cm &/Or Wo Cm  Result Date: 08/22/2021 CLINICAL DATA:  Pulmonary embolus suspected. Positive D-dimer. Chest pain and shortness of breath. EXAM: CT ANGIOGRAPHY CHEST WITH CONTRAST TECHNIQUE: Multidetector CT imaging of the chest was performed using the standard protocol during bolus administration of intravenous contrast. Multiplanar CT image reconstructions and MIPs were obtained to evaluate the vascular anatomy. RADIATION DOSE REDUCTION: This exam was performed according to the departmental dose-optimization program which includes automated exposure control, adjustment of the mA and/or kV according to patient size and/or use of iterative reconstruction technique. CONTRAST:  75m OMNIPAQUE IOHEXOL 350 MG/ML SOLN COMPARISON:  None Available. FINDINGS: Cardiovascular: Good opacification of the central and segmental pulmonary arteries. The examination is positive for multiple pulmonary emboli demonstrated in bilateral upper and lower lobe segmental and subsegmental branches. No large central pulmonary embolus. The RV to LV ratio is elevated at 1.1, however, there is evidence of left ventricular hypertrophy which may be responsible for this. Cardiac enlargement. Small pericardial effusion. Normal caliber thoracic aorta. No aortic dissection. Mediastinum/Nodes: There is a large right suprahilar mass extending into  the mediastinum and likely involving right hilar and mediastinal lymph nodes as well. The mass surrounds the right mainstem bronchus and crosses the midline at the level of the carina. Overall, the mass measures about 4.4 x 6.5 cm. The esophagus is decompressed. Lungs/Pleura: Small left pleural effusion with consolidation or atelectasis in the left lung base. Mild atelectasis in the right lung base. No pneumothorax. Upper Abdomen: Diffusely heterogeneous nodular appearance to the liver likely indicating diffuse liver metastasis. There is a mass in the tail of the pancreas measuring 3.3 cm diameter. Mass lesions are identified in the left inferior outer breast tissue, with largest measuring 2.7 cm in diameter. Musculoskeletal: There is an expansile mass lesion involving the anterior left fifth rib with soft tissue component measuring 3.7 x 5.7 cm. Lucent lesion with superior endplate compression fracture at T10 and additional smaller vertebral lucencies are likely metastatic. Review of the MIP images confirms the above findings. IMPRESSION: 1. Positive examination for bilateral segmental and subsegmental pulmonary emboli. 2. Large right suprahilar mass extending contiguously into the mediastinum and measuring about 6.5 cm maximal diameter. Malignant lesions are also demonstrated throughout the liver, in the tail of the pancreas, in the left anterior fifth rib and multiple vertebral bodies as well as in the left breast tissue and pancreas. The primary lesion is uncertain and could represent the lung mass which is the largest, the breast lesions, or the pancreatic lesion. Tissue sampling is recommended. 3. Small left pleural effusion with consolidation or atelectasis in the left lung base. 4. Cardiac enlargement with left ventricular hypertrophy  and small pericardial effusion. Critical Value/emergent results were called by telephone at the time of interpretation on 08/22/2021 at 1:19 am to provider [facility] ,  who verbally acknowledged these results. Electronically Signed   By: Lucienne Capers M.D.   On: 08/22/2021 01:29   DG Chest 1 View  Result Date: 08/21/2021 CLINICAL DATA:  Chest pain, back pain EXAM: CHEST  1 VIEW COMPARISON:  05/13/2020 FINDINGS: Lungs volumes are small and there is left basilar atelectasis. No pneumothorax or pleural effusion. Cardiac size within normal limits. Pulmonary vascularity is normal. Osseous structures are age-appropriate. No acute bone abnormality. IMPRESSION: Pulmonary hypoinflation. Electronically Signed   By: Fidela Salisbury M.D.   On: 08/21/2021 21:57    ASSESSMENT: This is a very pleasant 56 years old African-American female with history of stage Ib (T2 a, N0, M0) non-small cell lung cancer, adenocarcinoma with positive EGFR mutation diagnosed in May 2020 status post right upper lobectomy with lymph node dissection followed by adjuvant treatment with osimertinib for around 3 years. The patient is currently presenting with very aggressive metastatic disease of unclear etiology probably lung but other etiology cannot be completely excluded including upper gastrointestinal, pancreatic or even breast diagnosed and May 2023 but the tissue biopsy was not sufficient to identify the primary etiology. She has significant metastasis including the lung, mediastinum, liver, bone, breast, pancreas as well as kidney, soft tissue and muscular lesions. The patient was also recently diagnosed with bilateral pulmonary embolism and currently on treatment with Eliquis.  PLAN: I had a lengthy discussion with the patient and her sister were at the bedside today about her current condition and further investigation to confirm her diagnosis and possible treatment options. I recommended for the patient to have repeat biopsy and most likely would consider one of the soft tissue lesions in the left chest wall area for confirmation of the primary etiology of her malignancy and also to have enough  tissue for molecular studies.  She had a guardant blood test by her oncologist in [place] but unfortunately I do not have the result for that test. I may consider sending another test if I cannot get the results from him in a timely manner. For the pain management she is currently on several pain medication including fentanyl patch, Dilaudid, Neurontin as well as OxyContin and Percocet for breakthrough pain. Unfortunately her prognosis is very poor with the aggressive nature of this disease.  Evaluation by the palliative care team is also strongly recommended to discuss goals of care. If the patient is fortunate to have another actionable mutation, her prognosis will be a little bit better but I doubt with systemic therapy would be very beneficial with her massive metastatic disease. I will update the patient with my recommendation based on the new tissue biopsy and molecular studies. For now continue the current supportive care. The patient voices understanding of current disease status and treatment options and is in agreement with the current care plan.  All questions were answered. The patient knows to call the clinic with any problems, questions or concerns. We can certainly see the patient much sooner if necessary.  Thank you so much for allowing me to participate in the care of BEZA STEPPE. I will continue to follow up the patient with you and assist in her care.   Disclaimer: This note was dictated with voice recognition software. Similar sounding words can inadvertently be transcribed and may not be corrected upon review.   [name] September 11, 2021, 5:25 PM

## 2021-09-11 NOTE — Progress Notes (Signed)
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  In ED, afebrile but tachycardic.  WBC 17.3 with left shift.  Hgb 9.6.  ALP elevated to 426.  CXR with stable right healer and suprahilar mass and a stable right basilar opacity.  Patient was started on IV pain medication and admitted for pain control.    Radiation oncology consulted, and patient started radiation treatment on 7/17.  Pain control remains as barrier to discharge.  Oncology and palliative medicine consulted  Subjective: Seen and examined earlier this morning.  No major events overnight of this morning.  Continues pain in his shoulders and lower back.  She is holding ice packs on both shoulders and right arm.  She rates her pain as "severe".  Objective: Vitals:   09/10/21 1242 09/10/21 2050 09/11/21 0424 09/11/21 1347  BP: 115/67 (!) 112/55 127/72 120/72  Pulse: (!) 112 (!) 121 (!) 126 (!) 112  Resp: 18 18 18 18   Temp: 99.1 F (37.3 C) 98.4 F (36.9 C) 99.7 F (37.6 C) 98.5 F (36.9 C)  TempSrc: Oral Oral Oral Oral  SpO2: 98% 98% 100% 100%  Weight:      Height:        Examination:  GENERAL: No apparent distress.  Nontoxic. HEENT: MMM.  Vision and hearing grossly intact.  NECK: Supple.  No apparent JVD.  RESP:  No IWOB.  Fair aeration bilaterally. CVS:  RRR. Heart sounds normal.  ABD/GI/GU: BS+. Abd soft, NTND.  MSK/EXT:  Moves extremities. No apparent deformity.  1+ BLE edema. SKIN: no apparent skin lesion or wound NEURO: Awake and alert. Oriented appropriately.  No  apparent focal neuro deficit. PSYCH: Calm. Normal affect.   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  Patient had RUL lung wedge resection with clear margins in [place] in 2020.  Pathology at that time showed moderately differentiated adenocarcinoma of the lung.  CT angio chest showed large right suprahilar mass extending continuously into mediastinum and measuring about 6.3 cm maximal diameter with mets to liver, tail of pancreas, left anterior fifth ribs and multiple vertebral bodies as well as the left breast tissue.  Patient has an upcoming appointment with Dr. Julien Nordmann on 09/10/2021.  Unfortunately, pain control has been a huge challenge requiring IV Dilaudid. -Radiation oncology started radiation on 7/17. -Reached out to Dr. Julien Nordmann who will see patient later today.    Cancer related pain: Continues to endorse significant pain.  Was not able to do radiation without IV pain medication.  Also not able to lie flat due to pain.  -Continue OxyContin 40 mg twice daily and Neurontin 20 mg twice daily -Continue fentanyl patch to 25 mcg every 72 hours. -Continue as  needed Percocet for moderate pain and IV Dilaudid for severe pain -Aggressive bowel regimen -Ordered hospital bed -Consulted palliative care for assistance with pain management and goals of care discussion   Recent pulmonary embolism:bilateral PE diagnosed on 08/22/21.  -Continue Eliquis.   Left arm swelling: reported on 7/13.  CT left humerus on 7/14 showed 3.7 cm left infraspinatus muscle, 6.2 cm left biceps muscle and other multiple soft tissue masses along the left chest wall  and within the left breast soft tissues consistent with metastatic disease   Sinus tachycardia: Likely due to pain and underlying PE. -Increase metoprolol to 50 mg twice daily  Anemia of chronic disease: Likely due to malignancy. Recent Labs    08/21/21 2137 08/22/21 0325 08/23/21 0149 08/24/21 0058 08/25/21 0033 09/12/2021 2149 09/04/21 0500 09/06/21 0423 09/11/21 0409  HGB 10.5* 9.2* 9.6* 9.7* 10.0* 9.6* 8.9* 9.1* 8.6*  -Check anemia panel in the morning -Continue monitoring  Physical deconditioning -Therapy recommended home health.  Will add rolling walker, bedside commode and hospital bed  Lactic acidosis: Likely type B.  Elevated AST: CK within normal.  Bandemia: Likely from malignancy and demargination. -Monitor  Body mass index is 27.32 kg/m.   DVT prophylaxis:   apixaban (ELIQUIS) tablet 5 mg  Code Status: Full code Family Communication: None at bedside today. Level of care: Telemetry Status is: Inpatient Remains inpatient appropriate because: Intractable cancer related pain   Final disposition: Likely home Consultants:  Radiation oncology Palliative medicine  Sch Meds:  Scheduled Meds:  apixaban  5 mg Oral BID   fentaNYL  1 patch Transdermal Q72H   gabapentin  200 mg Oral BID   latanoprost  1 drop Both Eyes QHS   lidocaine  1 patch Transdermal Q24H   metoprolol tartrate  50 mg Oral BID   oxyCODONE  40 mg Oral Q12H   sodium chloride flush  3 mL Intravenous Q12H   traZODone  100 mg Oral QHS   Continuous Infusions: PRN Meds:.acetaminophen **OR** acetaminophen, bisacodyl, HYDROmorphone (DILAUDID) injection, magnesium hydroxide, ondansetron **OR** ondansetron (ZOFRAN) IV, mouth rinse, oxyCODONE-acetaminophen, polyethylene glycol, senna-docusate  Antimicrobials: Anti-infectives (From admission, onward)    None        I have personally reviewed the following labs and images: CBC: Recent Labs  Lab 09/06/21 0423 09/11/21 0409  WBC 15.6*  17.8*  NEUTROABS 11.3*  --   HGB 9.1* 8.6*  HCT 29.9* 27.6*  MCV 78.3* 76.7*  PLT 508* 417*   BMP &GFR Recent Labs  Lab 09/06/21 0423 09/11/21 0409  NA 141 140  K 4.2 4.8  CL 101 100  CO2 28 28  GLUCOSE 123* 95  BUN 16 18  CREATININE 0.59 0.57  CALCIUM 9.3 9.4  MG  --  2.3  PHOS  --  3.7   Estimated Creatinine Clearance: 88.7 mL/min (by C-G formula based on SCr of 0.57 mg/dL). Liver & Pancreas: Recent Labs  Lab 09/11/21 0409  AST 61*  ALT 35  ALKPHOS 481*  BILITOT 0.8  PROT 6.7  ALBUMIN 2.5*   No results for input(s): "LIPASE", "AMYLASE" in the last 168 hours. No results for input(s): "AMMONIA" in the last 168 hours. Diabetic: No results for input(s): "HGBA1C" in the last 72 hours. No results for input(s): "GLUCAP" in the last 168 hours. Cardiac Enzymes: Recent Labs  Lab 09/11/21 0409  CKTOTAL 41   No results for input(s): "PROBNP" in the last 8760 hours. Coagulation Profile: No results for input(s): "INR", "PROTIME" in the last 168 hours.  Thyroid Function Tests: Recent Labs    09/09/21 0701  TSH 0.545   Lipid Profile: No results for input(s): "CHOL", "HDL", "LDLCALC", "TRIG", "CHOLHDL", "LDLDIRECT" in the last 72 hours. Anemia Panel: No results for input(s): "VITAMINB12", "FOLATE", "FERRITIN", "TIBC", "IRON", "RETICCTPCT" in the last 72 hours. Urine analysis:    Component Value Date/Time   COLORURINE YELLOW 02/14/2010 0600   APPEARANCEUR CLEAR 02/14/2010 0600   LABSPEC 1.028 02/14/2010 0600   PHURINE 5.5 02/14/2010 0600   GLUCOSEU NEGATIVE 02/14/2010 0600   HGBUR NEGATIVE 02/14/2010 0600   BILIRUBINUR LARGE (A) 02/14/2010 0600   KETONESUR NEGATIVE 02/14/2010 0600   PROTEINUR NEGATIVE 02/14/2010 0600   UROBILINOGEN 1.0 02/14/2010 0600   NITRITE NEGATIVE 02/14/2010 0600   LEUKOCYTESUR  02/14/2010 0600    NEGATIVE MICROSCOPIC NOT DONE ON URINES WITH NEGATIVE PROTEIN, BLOOD, LEUKOCYTES, NITRITE, OR GLUCOSE <1000 mg/dL.   Sepsis Labs: Invalid  input(s): "PROCALCITONIN", "LACTICIDVEN"  Microbiology: No results found for this or any previous visit (from the past 240 hour(s)).  Radiology Studies: No results found.    Iona Stay T. [place]  If 7PM-7AM, please contact night-coverage [email or web address] 09/11/2021, 2:54 PM

## 2021-09-12 ENCOUNTER — Other Ambulatory Visit: Payer: Self-pay

## 2021-09-12 ENCOUNTER — Ambulatory Visit
Admit: 2021-09-12 | Discharge: 2021-09-12 | Disposition: A | Discharge status: Home / Self Care | Payer: BLUE CROSS/BLUE SHIELD | Attending: Radiation Oncology | Admitting: Radiation Oncology

## 2021-09-12 DIAGNOSIS — R52 Pain, unspecified: Secondary | ICD-10-CM | POA: Diagnosis not present

## 2021-09-12 DIAGNOSIS — R0789 Other chest pain: Secondary | ICD-10-CM | POA: Diagnosis not present

## 2021-09-12 DIAGNOSIS — C3411 Malignant neoplasm of upper lobe, right bronchus or lung: Secondary | ICD-10-CM | POA: Diagnosis not present

## 2021-09-12 DIAGNOSIS — K5903 Drug induced constipation: Secondary | ICD-10-CM

## 2021-09-12 DIAGNOSIS — E872 Acidosis, unspecified: Secondary | ICD-10-CM | POA: Diagnosis not present

## 2021-09-12 DIAGNOSIS — G893 Neoplasm related pain (acute) (chronic): Principal | ICD-10-CM

## 2021-09-12 DIAGNOSIS — C799 Secondary malignant neoplasm of unspecified site: Secondary | ICD-10-CM

## 2021-09-12 DIAGNOSIS — I2699 Other pulmonary embolism without acute cor pulmonale: Secondary | ICD-10-CM | POA: Diagnosis not present

## 2021-09-12 DIAGNOSIS — C7989 Secondary malignant neoplasm of other specified sites: Secondary | ICD-10-CM | POA: Diagnosis not present

## 2021-09-12 DIAGNOSIS — T402X5A Adverse effect of other opioids, initial encounter: Secondary | ICD-10-CM

## 2021-09-12 DIAGNOSIS — R000 Tachycardia, unspecified: Secondary | ICD-10-CM

## 2021-09-12 DIAGNOSIS — Z515 Encounter for palliative care: Secondary | ICD-10-CM

## 2021-09-12 DIAGNOSIS — R002 Palpitations: Secondary | ICD-10-CM

## 2021-09-12 LAB — RAD ONC ARIA SESSION SUMMARY
Course Elapsed Days: 3
Plan Fractions Treated to Date: 2
Plan Fractions Treated to Date: 3
Plan Prescribed Dose Per Fraction: 3 Gy
Plan Prescribed Dose Per Fraction: 3 Gy
Plan Total Fractions Prescribed: 10
Plan Total Fractions Prescribed: 9
Plan Total Prescribed Dose: 27 Gy
Plan Total Prescribed Dose: 30 Gy
Reference Point Dosage Given to Date: 9 Gy
Reference Point Dosage Given to Date: 9 Gy
Reference Point Session Dosage Given: 3 Gy
Reference Point Session Dosage Given: 3 Gy
Session Number: 3

## 2021-09-12 MED ORDER — DEXAMETHASONE SODIUM PHOSPHATE 4 MG/ML IJ SOLN
4.0000 mg | Freq: Once | INTRAMUSCULAR | Status: AC
  Administered 2021-09-13: 4 mg via INTRAVENOUS
  Filled 2021-09-12: qty 1

## 2021-09-12 MED ORDER — GABAPENTIN 100 MG PO CAPS
200.0000 mg | ORAL_CAPSULE | Freq: Three times a day (TID) | ORAL | Status: DC
  Administered 2021-09-12 – 2021-09-25 (×37): 200 mg via ORAL
  Filled 2021-09-12 (×37): qty 2

## 2021-09-12 MED ORDER — SENNA 8.6 MG PO TABS
1.0000 | ORAL_TABLET | Freq: Every day | ORAL | Status: DC
  Administered 2021-09-12 – 2021-09-30 (×19): 8.6 mg via ORAL
  Filled 2021-09-12 (×19): qty 1

## 2021-09-12 NOTE — Consult Note (Signed)
Chief Complaint: Patient was seen in consultation today for metastatic disease at the request of Dr. Julien Nordmann  Referring Physician(s): Dr. Julien Nordmann  Supervising Physician: Daryll Brod  Patient Status: [facility] - In-pt  History of Present Illness: [name] is a 56 y.o. 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  CT angio chest showed large right suprahilar mass extending continuously into mediastinum and measuring about 6.3 cm maximal diameter with mets to liver, tail of pancreas, left anterior fifth ribs and multiple vertebral bodies as well as the left breast tissue.  Oncology ordered CT biopsy to confirm primary etiology of her malignancy and for molecular studies.  [name] recognizes some SOB when lying down and reports being on 2 LPM O2 at home and during this admission.  She says her pain is controlled when she gets medication, but if she misses a dose, pain is unbearable.  Primary pain seems to be generalized in her back.  She denies ha, dizziness, fever/chills, palpitations, abdominal pain, N/V, changes in bowel or urinary habits.  She uses a walker for ambulation.  She is joined by her brother and sister today.  Past Medical History:  Diagnosis Date   Allergy    Arthritis    Complication of anesthesia    No pertinent past medical history    PONV (postoperative nausea and vomiting)    nausea with dizziness    Past Surgical History:  Procedure Laterality Date   ABDOMINAL HYSTERECTOMY     CHOLECYSTECTOMY     ENDOMETRIAL ABLATION     KNEE ARTHROSCOPY     LAPAROSCOPIC ASSISTED VAGINAL HYSTERECTOMY  02/13/2011   Procedure: LAPAROSCOPIC ASSISTED VAGINAL HYSTERECTOMY;  Surgeon: Olga Millers;  Location: [facility];  Service: Gynecology;  Laterality: N/A;   SALPINGOOPHORECTOMY  02/13/2011   Procedure: SALPINGO OOPHERECTOMY;  Surgeon: Olga Millers;  Location: [facility];  Service:  Gynecology;  Laterality: Bilateral;   TONSILLECTOMY      Allergies: Fish allergy, Penicillins, Augmentin [amoxicillin-pot clavulanate], Betadine [povidone iodine], Shellfish-derived products, and Tetanus toxoids  Medications: Prior to Admission medications   Medication Sig Start Date End Date Taking? Authorizing Provider  acetaminophen (TYLENOL) 325 MG tablet Take 2 tablets (650 mg total) by mouth every 6 (six) hours as needed for mild pain (or Fever >/= 101). 08/26/21  Yes Shalhoub, Sherryll Burger, MD  apixaban (ELIQUIS) 5 MG TABS tablet Take 1 tablet (5 mg total) by mouth 2 (two) times daily. 09/01/21  Yes Shalhoub, Sherryll Burger, MD  gabapentin (NEURONTIN) 100 MG capsule Take 200 mg by mouth in the morning and at bedtime. 08/05/21  Yes [provider]  latanoprost (XALATAN) 0.005 % ophthalmic solution Place 1 drop into both eyes at bedtime.   Yes [provider]  oxyCODONE (OXYCONTIN) 20 mg 12 hr tablet Take 20 mg by mouth every 12 (twelve) hours as needed (for pain).   Yes [provider]  oxyCODONE-acetaminophen (PERCOCET/ROXICET) 5-325 MG tablet Take 1-2 tablets by mouth every 4 (four) hours as needed for severe pain.   Yes [provider]  polyethylene glycol (MIRALAX / GLYCOLAX) 17 g packet Take 17 g by mouth 2 (two) times daily. 08/26/21  Yes Shalhoub, Sherryll Burger, MD  senna-docusate (SENOKOT-S) 8.6-50 MG tablet Take 1 tablet by mouth 2 (two) times daily. 08/26/21  Yes Shalhoub, Sherryll Burger, MD  traZODone (DESYREL) 50 MG tablet Take 100 mg by mouth at bedtime. 08/03/21  Yes [provider]  apixaban (ELIQUIS) 5 MG TABS tablet Take 2 tablets (10 mg total) by mouth 2 (two) times daily for 5 days. Patient not taking: Reported on 08/28/2021 08/26/21 09/01/2021  Vernelle Emerald, MD  baclofen (LIORESAL) 10 MG tablet Take 10 mg by mouth 3 (three) times daily. Patient not taking: Reported on 09/13/2021 08/04/21   [provider]  famotidine (PEPCID) 20 MG tablet Take 20  mg by mouth 2 (two) times daily. Patient not taking: Reported on 08/25/2021 07/02/21   [provider]  fluticasone (FLONASE) 50 MCG/ACT nasal spray Place into both nostrils. 09/07/21   [provider]  naproxen (NAPROSYN) 500 MG tablet Take by mouth. 09/06/21   [provider]     Family History  Problem Relation Age of Onset   Cancer Mother    Heart disease Father    Cancer Brother    Mental illness Son    Cancer Paternal Aunt    Cancer Paternal Uncle    Cancer Maternal Aunt     Social History   Socioeconomic History   Marital status: Divorced    Spouse name: Not on file   Number of children: Not on file   Years of education: Not on file   Highest education level: Not on file  Occupational History   Not on file  Tobacco Use   Smoking status: Never   Smokeless tobacco: Not on file  Substance and Sexual Activity   Alcohol use: Yes    Alcohol/week: 2.0 standard drinks of alcohol    Types: 2 Glasses of wine per week    Comment: occasionally   Drug use: No   Sexual activity: Not Currently  Other Topics Concern   Not on file  Social History Narrative   Not on file   Social Determinants of Health   Financial Resource Strain: Not on file  Food Insecurity: Not on file  Transportation Needs: Not on file  Physical Activity: Not on file  Stress: Not on file  Social Connections: Not on file   Review of Systems: A 12 point ROS discussed and pertinent positives are indicated in the HPI above.  All other systems are negative.  Vital Signs: BP 109/63 (BP Location: Left Arm)   Pulse (!) 108   Temp 98.5 F (36.9 C) (Oral)   Resp 20   Ht 5\' 9"  (1.753 m)   Wt 185 lb (83.9 kg)   SpO2 100%   BMI 27.32 kg/m   Physical Exam Vitals reviewed.  Constitutional:      General: She is not in acute distress.    Appearance: She is not toxic-appearing.  HENT:     Head: Normocephalic and atraumatic.  Eyes:     Extraocular Movements: Extraocular movements  intact.  Cardiovascular:     Rate and Rhythm: Tachycardia present.  Pulmonary:     Effort: Pulmonary effort is normal. No respiratory distress.  Chest:     Chest wall: No deformity or tenderness.  Abdominal:     Palpations: Abdomen is soft.  Musculoskeletal:     Right lower leg: Edema present.     Left lower leg: Edema present.  Skin:    General: Skin is warm and dry.  Neurological:     General: No focal deficit present.     Mental Status: She is alert and oriented to person, place, and time.  Psychiatric:        Mood and Affect: Mood normal.  Behavior: Behavior normal.     Imaging: DG Abd Portable 1V  Result Date: 09/07/2021 CLINICAL DATA:  Constipation EXAM: PORTABLE ABDOMEN - 1 VIEW COMPARISON:  None Available. FINDINGS: 2 supine frontal views of the abdomen and pelvis are obtained. No bowel obstruction or ileus. Minimal stool within the cecum. No significant fecal retention. No masses or abnormal calcifications. No acute bony abnormalities. IMPRESSION: 1. Minimal retained stool within the cecum. No bowel obstruction or ileus. Electronically Signed   By: Randa Ngo M.D.   On: 09/07/2021 16:11   CT HUMERUS LEFT W CONTRAST  Result Date: 09/06/2021 CLINICAL DATA:  Left arm swelling.  History of lung cancer EXAM: CT OF THE UPPER LEFT EXTREMITY WITH CONTRAST TECHNIQUE: Multidetector CT imaging of the upper left extremity was performed according to the standard protocol following intravenous contrast administration. RADIATION DOSE REDUCTION: This exam was performed according to the departmental dose-optimization program which includes automated exposure control, adjustment of the mA and/or kV according to patient size and/or use of iterative reconstruction technique. CONTRAST:  65mL OMNIPAQUE IOHEXOL 300 MG/ML  SOLN COMPARISON:  Chest CT 08/22/2021 FINDINGS: Bones/Joint/Cartilage Lytic changes along the posterior aspect of the glenoid neck (series 3, images 22-24) with overlying  soft tissue mass centered within the left infraspinatus muscle measuring approximately 3.7 x 2.5 x 3.6 cm (series 4, image 19; series 603, image 10). Osseous structures appear otherwise intact. No additional bone lesion is evident by CT. No acute fracture or dislocation. Ligaments Suboptimally assessed by CT. Muscles and Tendons Soft tissue mass centered in the left infraspinatus muscle, as above. Additional intramuscular lesion within the mid biceps muscle measuring approximately 6.2 x 2.5 x 3.0 cm (series 606, image 28; series 4, image 106). Soft tissues Multiple soft tissue masses are again seen along the left chest wall and within the visualized left breast soft tissues largest measuring 3.5 x 2.6 x 3.2 cm along the lateral left chest wall (series 4, image 98). Largest nodule visualized within the left breast soft tissues measures 1.7 x 1.5 cm (series 4, image 24). IMPRESSION: 1. Lytic changes along the posterior aspect of the glenoid neck with overlying soft tissue mass centered within the left infraspinatus muscle measuring up to 3.7 cm. Findings are most suggestive of soft tissue metastatic lesion with associated bony involvement. 2. Additional intramuscular lesion within the mid biceps muscle measuring up to 6.2 cm, also concerning for metastatic disease. 3. Multiple soft tissue masses along the left chest wall and within the visualized left breast soft tissues, compatible with metastatic disease. Electronically Signed   By: Davina Poke D.O.   On: 09/06/2021 11:15   DG Shoulder Left  Result Date: 09/04/2021 CLINICAL DATA:  Left arm swelling. EXAM: LEFT SHOULDER - 2+ VIEW COMPARISON:  None Available. FINDINGS: There is no evidence of fracture or dislocation. There is no evidence of arthropathy or other focal bone abnormality. Soft tissues are unremarkable. IMPRESSION: Negative. Electronically Signed   By: Ronney Asters M.D.   On: 09/04/2021 19:38   DG Chest 2 View  Result Date:  09/07/2021 CLINICAL DATA:  Mid chest pain.  Lung cancer. EXAM: CHEST - 2 VIEW COMPARISON:  Chest CT 08/22/2021 FINDINGS: Right hilar/suprahilar mass appears grossly unchanged. There is some atelectasis and airspace disease in the left lung base which is unchanged. There is no pleural effusion or pneumothorax. Cardiac silhouette within normal limits. No acute fracture. IMPRESSION: 1. Stable right hilar/suprahilar mass. 2. Stable left basilar atelectasis/airspace disease. Electronically Signed   By: Warren Lacy  Dagoberto Reef M.D.   On: 08/31/2021 22:39   ECHOCARDIOGRAM COMPLETE  Result Date: 08/22/2021    ECHOCARDIOGRAM REPORT   Patient Name:   JUDEE HENNICK [facility] Date of Exam: 08/22/2021 Medical Rec #:  [id number]        Height:       69.0 in Accession #:    [id number]       Weight:       185.0 lb Date of Birth:  July 29, 1965         BSA:          1.999 m Patient Age:    61 years         BP:           120/80 mmHg Patient Gender: F                HR:           100 bpm. Exam Location:  Inpatient Procedure: 2D Echo, Cardiac Doppler and Color Doppler Indications:    Pulmonary embolism  History:        Patient has no prior history of Echocardiogram examinations.                 Signs/Symptoms:Chest Pain and Shortness of Breath.  Sonographer:    Joette Catching RCS Referring Phys: [id number] [place]  1. Left ventricular ejection fraction, by estimation, is 60 to 65%. The left ventricle has normal function. The left ventricle has no regional wall motion abnormalities. Left ventricular diastolic parameters are consistent with Grade I diastolic dysfunction (impaired relaxation).  2. Right ventricular systolic function is normal. The right ventricular size is normal. There is moderately elevated pulmonary artery systolic pressure. The estimated right ventricular systolic pressure is 24.0 mmHg.  3. The mitral valve is normal in structure. No evidence of mitral valve regurgitation. No evidence of mitral stenosis.  4. The  aortic valve is normal in structure. Aortic valve regurgitation is not visualized. No aortic stenosis is present.  5. The inferior vena cava is normal in size with <50% respiratory variability, suggesting right atrial pressure of 8 mmHg.  6. There is a small to moderate circumferential pericardial effusion present. The pericardial effusion measures 1.45cm at greatest diameter posteriorly.     There is no evidence of cardiac tamponade. FINDINGS  Left Ventricle: Left ventricular ejection fraction, by estimation, is 60 to 65%. The left ventricle has normal function. The left ventricle has no regional wall motion abnormalities. The left ventricular internal cavity size was normal in size. There is  no left ventricular hypertrophy. Left ventricular diastolic parameters are consistent with Grade I diastolic dysfunction (impaired relaxation). Normal left ventricular filling pressure. Right Ventricle: The right ventricular size is normal. No increase in right ventricular wall thickness. Right ventricular systolic function is normal. There is moderately elevated pulmonary artery systolic pressure. The tricuspid regurgitant velocity is 3.45 m/s, and with an assumed right atrial pressure of 8 mmHg, the estimated right ventricular systolic pressure is 97.3 mmHg. Left Atrium: Left atrial size was normal in size. Right Atrium: Right atrial size was normal in size. Pericardium: The pericardial effusion measures 1.45cm at greatest diameter posteriorly. A small pericardial effusion is present. The pericardial effusion is circumferential. There is no evidence of cardiac tamponade. Mitral Valve: The mitral valve is normal in structure. No evidence of mitral valve regurgitation. No evidence of mitral valve stenosis. Tricuspid Valve: The tricuspid valve is normal in structure. Tricuspid valve regurgitation is mild . No evidence of  tricuspid stenosis. Aortic Valve: The aortic valve is normal in structure. Aortic valve regurgitation is  not visualized. No aortic stenosis is present. Aortic valve mean gradient measures 5.0 mmHg. Aortic valve peak gradient measures 8.6 mmHg. Aortic valve area, by VTI measures 2.81 cm. Pulmonic Valve: The pulmonic valve was normal in structure. Pulmonic valve regurgitation is not visualized. No evidence of pulmonic stenosis. Aorta: The aortic root is normal in size and structure. Venous: The inferior vena cava is normal in size with less than 50% respiratory variability, suggesting right atrial pressure of 8 mmHg. IAS/Shunts: No atrial level shunt detected by color flow Doppler.  LEFT VENTRICLE PLAX 2D LVIDd:         3.50 cm   Diastology LVIDs:         2.20 cm   LV e' medial:    7.18 cm/s LV PW:         1.00 cm   LV E/e' medial:  9.2 LV IVS:        0.90 cm   LV e' lateral:   10.60 cm/s LVOT diam:     2.10 cm   LV E/e' lateral: 6.2 LV SV:         73 LV SV Index:   37 LVOT Area:     3.46 cm  RIGHT VENTRICLE             IVC RV Basal diam:  3.10 cm     IVC diam: 1.70 cm RV Mid diam:    2.30 cm RV S prime:     14.20 cm/s TAPSE (M-mode): 2.0 cm LEFT ATRIUM             Index        RIGHT ATRIUM           Index LA diam:        2.70 cm 1.35 cm/m   RA Area:     10.70 cm LA Vol (A2C):   29.2 ml 14.61 ml/m  RA Volume:   23.00 ml  11.51 ml/m LA Vol (A4C):   22.8 ml 11.41 ml/m LA Biplane Vol: 27.1 ml 13.56 ml/m  AORTIC VALVE AV Area (Vmax):    3.04 cm AV Area (Vmean):   2.82 cm AV Area (VTI):     2.81 cm AV Vmax:           147.00 cm/s AV Vmean:          110.000 cm/s AV VTI:            0.261 m AV Peak Grad:      8.6 mmHg AV Mean Grad:      5.0 mmHg LVOT Vmax:         129.00 cm/s LVOT Vmean:        89.600 cm/s LVOT VTI:          0.212 m LVOT/AV VTI ratio: 0.81  AORTA Ao Root diam: 3.10 cm Ao Asc diam:  2.70 cm MITRAL VALVE               TRICUSPID VALVE MV Area (PHT): 9.14 cm    TR Peak grad:   47.6 mmHg MV Decel Time: 83 msec     TR Vmax:        345.00 cm/s MV E velocity: 66.00 cm/s MV A velocity: 91.30 cm/s  SHUNTS MV  E/A ratio:  0.72        Systemic VTI:  0.21 m  Systemic Diam: 2.10 cm Fransico Him MD Electronically signed by Fransico Him MD Signature Date/Time: 08/22/2021/10:10:00 AM    Final    CT Angio Chest PE W/Cm &/Or Wo Cm  Result Date: 08/22/2021 CLINICAL DATA:  Pulmonary embolus suspected. Positive D-dimer. Chest pain and shortness of breath. EXAM: CT ANGIOGRAPHY CHEST WITH CONTRAST TECHNIQUE: Multidetector CT imaging of the chest was performed using the standard protocol during bolus administration of intravenous contrast. Multiplanar CT image reconstructions and MIPs were obtained to evaluate the vascular anatomy. RADIATION DOSE REDUCTION: This exam was performed according to the departmental dose-optimization program which includes automated exposure control, adjustment of the mA and/or kV according to patient size and/or use of iterative reconstruction technique. CONTRAST:  36mL OMNIPAQUE IOHEXOL 350 MG/ML SOLN COMPARISON:  None Available. FINDINGS: Cardiovascular: Good opacification of the central and segmental pulmonary arteries. The examination is positive for multiple pulmonary emboli demonstrated in bilateral upper and lower lobe segmental and subsegmental branches. No large central pulmonary embolus. The RV to LV ratio is elevated at 1.1, however, there is evidence of left ventricular hypertrophy which may be responsible for this. Cardiac enlargement. Small pericardial effusion. Normal caliber thoracic aorta. No aortic dissection. Mediastinum/Nodes: There is a large right suprahilar mass extending into the mediastinum and likely involving right hilar and mediastinal lymph nodes as well. The mass surrounds the right mainstem bronchus and crosses the midline at the level of the carina. Overall, the mass measures about 4.4 x 6.5 cm. The esophagus is decompressed. Lungs/Pleura: Small left pleural effusion with consolidation or atelectasis in the left lung base. Mild atelectasis in the  right lung base. No pneumothorax. Upper Abdomen: Diffusely heterogeneous nodular appearance to the liver likely indicating diffuse liver metastasis. There is a mass in the tail of the pancreas measuring 3.3 cm diameter. Mass lesions are identified in the left inferior outer breast tissue, with largest measuring 2.7 cm in diameter. Musculoskeletal: There is an expansile mass lesion involving the anterior left fifth rib with soft tissue component measuring 3.7 x 5.7 cm. Lucent lesion with superior endplate compression fracture at T10 and additional smaller vertebral lucencies are likely metastatic. Review of the MIP images confirms the above findings. IMPRESSION: 1. Positive examination for bilateral segmental and subsegmental pulmonary emboli. 2. Large right suprahilar mass extending contiguously into the mediastinum and measuring about 6.5 cm maximal diameter. Malignant lesions are also demonstrated throughout the liver, in the tail of the pancreas, in the left anterior fifth rib and multiple vertebral bodies as well as in the left breast tissue and pancreas. The primary lesion is uncertain and could represent the lung mass which is the largest, the breast lesions, or the pancreatic lesion. Tissue sampling is recommended. 3. Small left pleural effusion with consolidation or atelectasis in the left lung base. 4. Cardiac enlargement with left ventricular hypertrophy and small pericardial effusion. Critical Value/emergent results were called by telephone at the time of interpretation on 08/22/2021 at 1:19 am to provider [facility] , who verbally acknowledged these results. Electronically Signed   By: Lucienne Capers M.D.   On: 08/22/2021 01:29   DG Chest 1 View  Result Date: 08/21/2021 CLINICAL DATA:  Chest pain, back pain EXAM: CHEST  1 VIEW COMPARISON:  05/13/2020 FINDINGS: Lungs volumes are small and there is left basilar atelectasis. No pneumothorax or pleural effusion. Cardiac size within normal  limits. Pulmonary vascularity is normal. Osseous structures are age-appropriate. No acute bone abnormality. IMPRESSION: Pulmonary hypoinflation. Electronically Signed   By: Linwood Dibbles.D.  On: 08/21/2021 21:57    Labs:  CBC: Recent Labs    09/06/2021 2149 09/04/21 0500 09/06/21 0423 09/11/21 0409  WBC 17.3* 16.1* 15.6* 17.8*  HGB 9.6* 8.9* 9.1* 8.6*  HCT 30.5* 28.7* 29.9* 27.6*  PLT 557* 503* 508* 417*    COAGS: No results for input(s): "INR", "APTT" in the last 8760 hours.  BMP: Recent Labs    09/18/2021 2149 09/04/21 0500 09/06/21 0423 09/11/21 0409  NA 136 137 141 140  K 5.0 4.3 4.2 4.8  CL 98 99 101 100  CO2 24 28 28 28   GLUCOSE 148* 106* 123* 95  BUN 22* 18 16 18   CALCIUM 9.1 9.0 9.3 9.4  CREATININE 0.64 0.52 0.59 0.57  GFRNONAA >60 >60 >60 >60    LIVER FUNCTION TESTS: Recent Labs    08/22/21 0325 09/10/2021 2149 09/04/21 0500 09/11/21 0409  BILITOT 0.4 0.8 0.6 0.8  AST 33 53* 39 61*  ALT 27 36 28 35  ALKPHOS 219* 426* 391* 481*  PROT 6.8 7.6 6.9 6.7  ALBUMIN 2.6* 2.9* 2.7* 2.5*    Assessment and Plan:  Metastatic disease --suspected to be primary lung, but looking for biopsy for verification and to direct treatment. --US soft tissue biopsy tomorrow tentatively planned for tomorrow 09/13/21. --Given recent PE and superficial nature of biopsy, will not hold Eliquis at this time.  Risks and benefits of soft tissue lesion biopsy was discussed with the patient and/or patient's family including, but not limited to bleeding, infection, damage to adjacent structures or low yield requiring additional tests.  All of the questions were answered and there is agreement to proceed.  Consent signed and in chart.   Thank you for this interesting consult.  I greatly enjoyed meeting [name] and look forward to participating in their care.  A copy of this report was sent to the requesting provider on this date.  Electronically Signed: Pasty Spillers,  PA 09/12/2021, 4:00 PM   I spent a total of  30 minutes  in face to face in clinical consultation, greater than 50% of which was counseling/coordinating care for metastatic disease.

## 2021-09-12 NOTE — Progress Notes (Signed)
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  In ED, afebrile but tachycardic.  WBC 17.3 with left shift.  Hgb 9.6.  ALP elevated to 426.  CXR with stable right healer and suprahilar mass and a stable right basilar opacity.  Patient was started on IV pain medication and admitted for pain control.    Radiation oncology consulted, and patient started radiation treatment on 7/17.  Pain control remains as barrier to discharge.  Oncology and palliative medicine consulted  Subjective: Seen and examined earlier this morning.  No major events overnight of this morning.  She reports having a good night and good morning.  She denies pain but very anxious about her pain when she go for radiation.  Main issue is difficulty lying in bed for radiation due to back pain.  She could not do radiation without IV Dilaudid yesterday.  Objective: Vitals:   09/12/21 0012 09/12/21 0353 09/12/21 1334 09/12/21 1354  BP: (!) 115/59 116/68 109/63   Pulse: (!) 108 (!) 115 (!) 108   Resp: 18 18 20 20   Temp: 98.2 F (36.8 C) 98.6 F (37 C) 98.5 F (36.9 C)   TempSrc: Oral Oral Oral   SpO2: 98% 100% 100%   Weight:      Height:        Examination:  GENERAL: No apparent distress.  Nontoxic. HEENT: MMM.  Vision and hearing grossly intact.  NECK: Supple.  No apparent JVD.  RESP:  No IWOB.  Fair aeration bilaterally. CVS:  RRR. Heart sounds normal.  ABD/GI/GU: BS+. Abd soft, NTND.  MSK/EXT:  Moves extremities. No apparent deformity.  1+ BLE edema. SKIN: no  apparent skin lesion or wound NEURO: Awake and alert. Oriented appropriately.  No apparent focal neuro deficit. PSYCH: Calm. Normal affect.   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  Patient had RUL lung wedge resection with clear margins in [place] in 2020.  Pathology at that time showed moderately differentiated adenocarcinoma of the lung.  CT angio chest showed large right suprahilar mass extending continuously into mediastinum and measuring about 6.3 cm maximal diameter with mets to liver, tail of pancreas, left anterior fifth ribs and multiple vertebral bodies as well as the left breast tissue.  -Radiation oncology started radiation on 7/17. -Oncology ordered CT biopsy to confirm primary etiology of her malignancy and for molecular studies.  However, prognosis felt to be very poor, and palliative consultation recommended.   Cancer related pain: Continues to endorse significant pain.  Was not able to do radiation or lie flat without IV pain medication.   -Palliative medicine consulted.  Appreciate help -Aggressive bowel regimen -Ordered  hospital bed -Appreciate help by oncology and palliative medicine.  I agree with adding Decadron and increasing gabapentin   Recent pulmonary embolism:bilateral PE diagnosed on 08/22/21.  -Continue Eliquis.   Left arm swelling: reported on 7/13.  CT left humerus on 7/14 showed 3.7 cm left infraspinatus muscle, 6.2 cm left biceps muscle and other multiple soft tissue masses along the left chest wall and within the left breast soft tissues consistent with  metastatic disease   Sinus tachycardia: Likely due to pain and underlying PE. -Increase metoprolol to 50 mg twice daily  Anemia of chronic disease: Likely due to malignancy. Recent Labs    08/21/21 2137 08/22/21 0325 08/23/21 0149 08/24/21 0058 08/25/21 0033 09/04/2021 2149 09/04/21 0500 09/06/21 0423 09/11/21 0409  HGB 10.5* 9.2* 9.6* 9.7* 10.0* 9.6* 8.9* 9.1* 8.6*  -Check anemia panel in the morning -Continue monitoring  Physical deconditioning -Therapy recommended home health.  Will add rolling walker, bedside commode and hospital bed  Lactic acidosis: Likely type B.  Elevated AST: CK within normal.  Bandemia: Likely from malignancy and demargination. -Monitor  Body mass index is 27.32 kg/m.   DVT prophylaxis:   apixaban (ELIQUIS) tablet 5 mg  Code Status: Full code Family Communication: None at bedside today. Level of care: Telemetry Status is: Inpatient Remains inpatient appropriate because: Intractable cancer related pain   Final disposition: Likely home Consultants:  Radiation oncology Palliative medicine  Sch Meds:  Scheduled Meds:  apixaban  5 mg Oral BID   fentaNYL  1 patch Transdermal Q72H   gabapentin  200 mg Oral TID   latanoprost  1 drop Both Eyes QHS   lidocaine  1 patch Transdermal Q24H   metoprolol tartrate  50 mg Oral BID   oxyCODONE  40 mg Oral Q12H   senna  1 tablet Oral QHS   sodium chloride flush  3 mL Intravenous Q12H   traZODone  100 mg Oral QHS   Continuous Infusions: PRN Meds:.acetaminophen **OR** acetaminophen, bisacodyl, HYDROmorphone (DILAUDID) injection, magnesium hydroxide, ondansetron **OR** ondansetron (ZOFRAN) IV, mouth rinse, oxyCODONE-acetaminophen, polyethylene glycol, senna-docusate  Antimicrobials: Anti-infectives (From admission, onward)    None        I have personally reviewed the following labs and images: CBC: Recent Labs  Lab 09/06/21 0423 09/11/21 0409  WBC 15.6* 17.8*  NEUTROABS 11.3*  --    HGB 9.1* 8.6*  HCT 29.9* 27.6*  MCV 78.3* 76.7*  PLT 508* 417*   BMP &GFR Recent Labs  Lab 09/06/21 0423 09/11/21 0409  NA 141 140  K 4.2 4.8  CL 101 100  CO2 28 28  GLUCOSE 123* 95  BUN 16 18  CREATININE 0.59 0.57  CALCIUM 9.3 9.4  MG  --  2.3  PHOS  --  3.7   Estimated Creatinine Clearance: 88.7 mL/min (by C-G formula based on SCr of 0.57 mg/dL). Liver & Pancreas: Recent Labs  Lab 09/11/21 0409  AST 61*  ALT 35  ALKPHOS 481*  BILITOT 0.8  PROT 6.7  ALBUMIN 2.5*   No results for input(s): "LIPASE", "AMYLASE" in the last 168 hours. No results for input(s): "AMMONIA" in the last 168 hours. Diabetic: No results for input(s): "HGBA1C" in the last 72 hours. No results for input(s): "GLUCAP" in the last 168 hours. Cardiac Enzymes: Recent Labs  Lab 09/11/21 0409  CKTOTAL 41   No results for input(s): "PROBNP" in the last 8760 hours. Coagulation Profile: No results for input(s): "INR", "PROTIME" in the last 168 hours. Thyroid Function Tests: No  results for input(s): "TSH", "T4TOTAL", "FREET4", "T3FREE", "THYROIDAB" in the last 72 hours.  Lipid Profile: No results for input(s): "CHOL", "HDL", "LDLCALC", "TRIG", "CHOLHDL", "LDLDIRECT" in the last 72 hours. Anemia Panel: No results for input(s): "VITAMINB12", "FOLATE", "FERRITIN", "TIBC", "IRON", "RETICCTPCT" in the last 72 hours. Urine analysis:    Component Value Date/Time   COLORURINE YELLOW 02/14/2010 0600   APPEARANCEUR CLEAR 02/14/2010 0600   LABSPEC 1.028 02/14/2010 0600   PHURINE 5.5 02/14/2010 0600   GLUCOSEU NEGATIVE 02/14/2010 0600   HGBUR NEGATIVE 02/14/2010 0600   BILIRUBINUR LARGE (A) 02/14/2010 0600   KETONESUR NEGATIVE 02/14/2010 0600   PROTEINUR NEGATIVE 02/14/2010 0600   UROBILINOGEN 1.0 02/14/2010 0600   NITRITE NEGATIVE 02/14/2010 0600   LEUKOCYTESUR  02/14/2010 0600    NEGATIVE MICROSCOPIC NOT DONE ON URINES WITH NEGATIVE PROTEIN, BLOOD, LEUKOCYTES, NITRITE, OR GLUCOSE <1000 mg/dL.    Sepsis Labs: Invalid input(s): "PROCALCITONIN", "LACTICIDVEN"  Microbiology: No results found for this or any previous visit (from the past 240 hour(s)).  Radiology Studies: No results found.    [place] [facility] [place]  If 7PM-7AM, please contact night-coverage [email or web address] 09/12/2021, 2:12 PM

## 2021-09-12 NOTE — Consult Note (Signed)
Consultation Note Date: 09/12/2021   Patient Name: [name]  DOB: Dec 11, 1965  MRN: [id number]  Age / Sex: 56 y.o., female  PCP: Pcp, No Referring Physician: Mercy Riding, MD  Reason for Consultation: Establishing goals of care  HPI/Patient Profile: 56 y.o. female  with past medical history of NSCLC with significant mets to lung, mediastinum, liver, bone, breast, pancreas as well as kidney, soft tissue and muscular lesions admitted on 09/21/2021 with uncontrolled pain. Radiation oncology consulted, and patient started radiation treatment on 7/17. She has been unable to tolerate radiation without IV pain medication. Of note, admitted in June/end of July with PE now on eliquis. PMT consulted to discuss [place].   Clinical Assessment and Goals of Care: I have reviewed medical records including EPIC notes, labs and imaging, assessed the patient and then met with patient and her sister [name]  to discuss diagnosis prognosis, [place], EOL wishes, disposition and options.  I introduced Palliative Medicine as specialized medical care for people living with serious illness. It focuses on providing relief from the symptoms and stress of a serious illness. The goal is to improve quality of life for both the patient and the family.   We discussed patient's current illness and what it means in the larger context of patient's on-going co-morbidities.   At this time patient complains on ongoing pain - specifically in her shoulders and lower back. She feels pain is better controlled than when she was first admitted but continues to need IV dilaudid occasionally. We discussed trial of steroids for better management. She is agreeable. She does not want to start tonight as she fears it will keep her up - we discussed dose in AM. She also complains of tingling pain in hands. We discussed increasing frequency of neurontin - she agrees. Added scheduled senna for bowels.    Discussed HCPOA - she would like her sister [name] named as HCPOA. Will consult spiritual care for assistance with documents.   Discussed with patient the importance of continued conversation with family and the medical providers regarding overall plan of care and treatment options, ensuring decisions are within the context of the patient's values and GOCs.    Questions and concerns were addressed. The family was encouraged to call with questions or concerns.    Primary Decision Maker PATIENT    SUMMARY OF RECOMMENDATIONS   Add decadron tomorrow AM Increase neurontin to TID Senna scheduled QHS Referral to palliative care at cancer center Spiritual care consult to assist in naming sister [name] as [place] discussed with Dr. Cyndia Skeeters     Primary Diagnoses: Present on Admission:  Chest wall pain  Pulmonary embolism ([place])  Metastatic adenocarcinoma ([place])  Intractable pain  Metastases to the liver [facility])  Pain from bone metastases ([place])   I have reviewed the medical record, interviewed the patient and family, and examined the patient. The following aspects are pertinent.  Past Medical History:  Diagnosis Date   Allergy    Arthritis    Complication of anesthesia    No pertinent past medical history    PONV (postoperative nausea and vomiting)    nausea with dizziness   Social History   Socioeconomic History   Marital status: Divorced    Spouse name: Not on file   Number of children: Not on file   Years of education: Not on file   Highest education level: Not on file  Occupational History   Not on file  Tobacco Use   Smoking  status: Never   Smokeless tobacco: Not on file  Substance and Sexual Activity   Alcohol use: Yes    Alcohol/week: 2.0 standard drinks of alcohol    Types: 2 Glasses of wine per week    Comment: occasionally   Drug use: No   Sexual activity: Not Currently  Other Topics Concern   Not on file  Social History Narrative   Not on file    Social Determinants of Health   Financial Resource Strain: Not on file  Food Insecurity: Not on file  Transportation Needs: Not on file  Physical Activity: Not on file  Stress: Not on file  Social Connections: Not on file   Family History  Problem Relation Age of Onset   Cancer Mother    Heart disease Father    Cancer Brother    Mental illness Son    Cancer Paternal Aunt    Cancer Paternal Uncle    Cancer Maternal Aunt    Scheduled Meds:  apixaban  5 mg Oral BID   fentaNYL  1 patch Transdermal Q72H   gabapentin  200 mg Oral TID   latanoprost  1 drop Both Eyes QHS   lidocaine  1 patch Transdermal Q24H   metoprolol tartrate  50 mg Oral BID   oxyCODONE  40 mg Oral Q12H   senna  1 tablet Oral QHS   sodium chloride flush  3 mL Intravenous Q12H   traZODone  100 mg Oral QHS   Continuous Infusions: PRN Meds:.acetaminophen **OR** acetaminophen, bisacodyl, HYDROmorphone (DILAUDID) injection, magnesium hydroxide, ondansetron **OR** ondansetron (ZOFRAN) IV, mouth rinse, oxyCODONE-acetaminophen, polyethylene glycol, senna-docusate Allergies  Allergen Reactions   Fish Allergy Hives   Penicillins Hives   Augmentin [Amoxicillin-Pot Clavulanate] Hives   Betadine [Povidone Iodine] Hives and Other (See Comments)    Bad hives, per pt   Shellfish-Derived Products Hives   Tetanus Toxoids Hives   Review of Systems  Constitutional:  Positive for fatigue.    Physical Exam Constitutional:      General: She is not in acute distress.    Appearance: She is ill-appearing.  Pulmonary:     Effort: Pulmonary effort is normal.  Skin:    General: Skin is warm and dry.  Neurological:     Mental Status: She is alert and oriented to person, place, and time.     Vital Signs: BP 109/63 (BP Location: Left Arm)   Pulse (!) 108   Temp 98.5 F (36.9 C) (Oral)   Resp 20   Ht _0  (1.753 m)   Wt 83.9 kg   SpO2 100%   BMI 27.32 kg/m  Pain Scale: 0-10 POSS *See Group Information*:  1-Acceptable,Awake and alert Pain Score: 7    SpO2: SpO2: 100 % O2 Device:SpO2: 100 % O2 Flow Rate: .O2 Flow Rate (L/min): 2 L/min  IO: Intake/output summary:  Intake/Output Summary (Last 24 hours) at 09/12/2021 1408 Last data filed at 09/12/2021 0900 Gross per 24 hour  Intake 480 ml  Output 200 ml  Net 280 ml    LBM: Last BM Date : 09/10/21 Baseline Weight: Weight: 83.9 kg Most recent weight: Weight: 83.9 kg     Palliative Assessment/Data: PPS 50%    *Please note that this is a verbal dictation therefore any spelling or grammatical errors are due to the "[place] [employer]" system interpretation.   Juel Burrow, DNP, AGNP-C Palliative Medicine Team [phone] Pager: [phone]

## 2021-09-12 NOTE — Progress Notes (Signed)
PT Cancellation Note  Patient Details Name: NOUR RODRIGUES MRN: [id number] DOB: [birth year]   Cancelled Treatment:    Reason Eval/Treat Not Completed: Patient at procedure or test/unavailable (pt out of room for radiation, will attempt later today.)   Philomena Doheny PT 09/12/2021  [place]  Office [phone]

## 2021-09-12 NOTE — Progress Notes (Addendum)
Physical Therapy Treatment Patient Details Name: CLINT STRUPP MRN: [id number] DOB: 08-31-1965 Today's Date: 09/12/2021   History of Present Illness 56 y.o. female who presents to the emergency department with severe chest pain.  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 CT left humerus on 7/14 showed 3.7 cm left infraspinatus muscle, 6.2 cm left biceps muscle and other multiple soft tissue masses along the left chest wall and within the left breast soft tissues consistent with metastatic disease    PT Comments    Pt is mobilizing well with a RW, she ambulated 400' without loss of balance, SpO2 93-97% on 2L O2  while walking, HR 123 max walking. Instructed pt in seated BLE strengthening exercises to be done independently. Edema noted BLEs, requested compression pumps from RN. She has met PT goals, will sign off. Mobility specialists to follow during the rest of acute stay.    Recommendations for follow up therapy are one component of a multi-disciplinary discharge planning process, led by the attending physician.  Recommendations may be updated based on patient status, additional functional criteria and insurance authorization.  Follow Up Recommendations  No follow up     Assistance Recommended at Discharge Intermittent Supervision/Assistance  Patient can return home with the following A little help with bathing/dressing/bathroom;Help with stairs or ramp for entrance;Assistance with cooking/housework;Assist for transportation   Equipment Recommendations  Rolling walker (2 wheels)    Recommendations for Other Services       Precautions / Restrictions Precautions Precaution Comments: on O2 2 L, monitor HR Restrictions Weight Bearing Restrictions: No     Mobility  Bed Mobility               General bed mobility comments: up in recliner    Transfers Overall transfer level: Modified independent Equipment used: Rolling walker (2  wheels) Transfers: Sit to/from Stand Sit to Stand: Modified independent (Device/Increase time)           General transfer comment: good safety awareness, used B armrests to push up, steady upon standing    Ambulation/Gait Ambulation/Gait assistance: Modified independent (Device/Increase time) Gait Distance (Feet): 400 Feet Assistive device: Rolling walker (2 wheels) Gait Pattern/deviations: WFL(Within Functional Limits) Gait velocity: WFL     General Gait Details: SpO2 97% on 2L O2, HR 123 with walking, steady, no loss of balance   Stairs             Wheelchair Mobility    Modified Rankin (Stroke Patients Only)       Balance Overall balance assessment: Modified Independent                                          Cognition Arousal/Alertness: Awake/alert Behavior During Therapy: WFL for tasks assessed/performed Overall Cognitive Status: Within Functional Limits for tasks assessed                                          Exercises General Exercises - Lower Extremity Ankle Circles/Pumps: AROM, Both, 5 reps, Seated Long Arc Quad: AROM, Both, 5 reps, Seated Hip Flexion/Marching: AROM, Both, 5 reps, Seated    General Comments        Pertinent Vitals/Pain Pain Assessment Pain Score: 6  Breathing: normal Negative Vocalization: none Facial Expression: smiling or inexpressive  Body Language: relaxed Consolability: no need to console PAINAD Score: 0 Pain Location: B shoulders and back Pain Descriptors / Indicators: Discomfort, Grimacing, Sharp, Shooting Pain Intervention(s): Limited activity within patient's tolerance, Monitored during session, Premedicated before session    Home Living                          Prior Function            PT Goals (current goals can now be found in the care plan section) Acute Rehab PT Goals Patient Stated Goal: likes to walk and read, is a Freight forwarder PT Goal  Formulation: All assessment and education complete, DC therapy Progress towards PT goals: Goals met/education completed, patient discharged from PT    Frequency    Min 3X/week      PT Plan Current plan remains appropriate    Co-evaluation              AM-PAC PT "6 Clicks" Mobility   Outcome Measure  Help needed turning from your back to your side while in a flat bed without using bedrails?: None Help needed moving from lying on your back to sitting on the side of a flat bed without using bedrails?: None Help needed moving to and from a bed to a chair (including a wheelchair)?: None Help needed standing up from a chair using your arms (e.g., wheelchair or bedside chair)?: None Help needed to walk in hospital room?: None Help needed climbing 3-5 steps with a railing? : None 6 Click Score: 24    End of Session Equipment Utilized During Treatment: Oxygen;Gait belt Activity Tolerance: Patient tolerated treatment well Patient left: in chair;with call bell/phone within reach;with family/visitor present Nurse Communication: Mobility status PT Visit Diagnosis: Difficulty in walking, not elsewhere classified (R26.2)     Time: 3568-6168 PT Time Calculation (min) (ACUTE ONLY): 21 min  Charges:  $Gait Training: 8-22 mins                     Blondell Reveal Kistler PT 09/12/2021  Acute Rehabilitation Services  Office [phone]

## 2021-09-13 ENCOUNTER — Inpatient Hospital Stay (HOSPITAL_COMMUNITY): Payer: BLUE CROSS/BLUE SHIELD

## 2021-09-13 ENCOUNTER — Ambulatory Visit
Admit: 2021-09-13 | Discharge: 2021-09-13 | Disposition: A | Discharge status: Home / Self Care | Payer: BLUE CROSS/BLUE SHIELD | Attending: Radiation Oncology | Admitting: Radiation Oncology

## 2021-09-13 ENCOUNTER — Other Ambulatory Visit: Payer: Self-pay | Admitting: *Deleted

## 2021-09-13 ENCOUNTER — Other Ambulatory Visit: Payer: Self-pay

## 2021-09-13 DIAGNOSIS — R52 Pain, unspecified: Secondary | ICD-10-CM | POA: Diagnosis not present

## 2021-09-13 DIAGNOSIS — E872 Acidosis, unspecified: Secondary | ICD-10-CM | POA: Diagnosis not present

## 2021-09-13 DIAGNOSIS — Z515 Encounter for palliative care: Secondary | ICD-10-CM | POA: Diagnosis not present

## 2021-09-13 DIAGNOSIS — G893 Neoplasm related pain (acute) (chronic): Secondary | ICD-10-CM | POA: Diagnosis not present

## 2021-09-13 DIAGNOSIS — C3411 Malignant neoplasm of upper lobe, right bronchus or lung: Secondary | ICD-10-CM | POA: Diagnosis not present

## 2021-09-13 DIAGNOSIS — C799 Secondary malignant neoplasm of unspecified site: Secondary | ICD-10-CM | POA: Diagnosis not present

## 2021-09-13 DIAGNOSIS — C7989 Secondary malignant neoplasm of other specified sites: Secondary | ICD-10-CM | POA: Diagnosis not present

## 2021-09-13 DIAGNOSIS — R0789 Other chest pain: Secondary | ICD-10-CM | POA: Diagnosis not present

## 2021-09-13 DIAGNOSIS — I2699 Other pulmonary embolism without acute cor pulmonale: Secondary | ICD-10-CM | POA: Diagnosis not present

## 2021-09-13 LAB — RAD ONC ARIA SESSION SUMMARY
Course Elapsed Days: 4
Plan Fractions Treated to Date: 3
Plan Prescribed Dose Per Fraction: 3 Gy
Plan Total Fractions Prescribed: 9
Plan Total Prescribed Dose: 27 Gy
Reference Point Dosage Given to Date: 12 Gy
Reference Point Session Dosage Given: 3 Gy
Session Number: 4

## 2021-09-13 MED ORDER — MIDAZOLAM HCL 2 MG/2ML IJ SOLN
INTRAMUSCULAR | Status: AC
  Filled 2021-09-13: qty 2

## 2021-09-13 MED ORDER — LIDOCAINE HCL 1 % IJ SOLN
INTRAMUSCULAR | Status: AC
  Filled 2021-09-13: qty 20

## 2021-09-13 MED ORDER — FENTANYL CITRATE (PF) 100 MCG/2ML IJ SOLN
INTRAMUSCULAR | Status: AC
  Filled 2021-09-13: qty 2

## 2021-09-13 NOTE — TOC CM/SW Note (Signed)
Patient has Metastatic adenocarcinoma which requires frequent repositioning which is not feasible with a normal bed. Head must be elevated at least 30 degrees.         Durable Medical Equipment  (From admission, onward)           Start     Ordered   09/10/21 1651  For home use only DME Bedside commode  Once       Question:  Patient needs a bedside commode to treat with the following condition  Answer:  Mobility impaired   09/10/21 1650   09/10/21 1650  For home use only DME Walker rolling  Once       Question Answer Comment  Walker: With [place]   Patient needs a walker to treat with the following condition Mobility impaired      09/10/21 1650   09/10/21 1441  For home use only DME Hospital bed  Once       Question Answer Comment  Length of Need Lifetime   The above medical condition requires: Patient requires the ability to reposition frequently   Head must be elevated greater than: 30 degrees   Bed type Semi-electric   Support Surface: Gel Overlay      09/10/21 [place], [place] CSW [phone]

## 2021-09-13 NOTE — Procedures (Signed)
Interventional Radiology Procedure Note  Procedure:  Ultrasound guided left upper extremity soft tissue mass biopsy  Findings: Please refer to procedural dictation for full description. Left biceps soft tissue mass 18 ga core x3.    Complications: None immediate  Estimated Blood Loss: < 5 mL  Recommendations: Follow up Pathology results.   [name], MD

## 2021-09-13 NOTE — Progress Notes (Signed)
Chaplain received a referral that Dalinda wished to name her sister as Economist.  They were able to finish the documents before chaplain arrived.  Chaplain asked if there were any additional needs and none were stated.  [address], [place] Pager, [phone]

## 2021-09-13 NOTE — Progress Notes (Signed)
The proposed treatment discussed in conference is for discussion purpose only and is not a binding recommendation.  The patients have not been physically examined, or presented with their treatment options.  Therefore, final treatment plans cannot be decided.  

## 2021-09-13 NOTE — Progress Notes (Signed)
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  In ED, afebrile but tachycardic.  WBC 17.3 with left shift.  Hgb 9.6.  ALP elevated to 426.  CXR with stable right healer and suprahilar mass and a stable right basilar opacity.  Patient was started on IV pain medication and admitted for pain control.    Radiation oncology consulted, and patient started radiation treatment on 7/17.  Pain control remains barriers to discharge.  Oncology and palliative medicine consulted.   IR consulted for CT-guided diagnostic tissue biopsy  Subjective: Seen and examined earlier this morning.  No major events overnight of this morning.  Reports improvement in the pain.  She rates her pain about 4/10 at present.  She says her pain is worse when she tries to lie down for radiation.  She has not started Decadron yet.  Objective: Vitals:   09/12/21 1354 09/12/21 2030 09/13/21 0101 09/13/21 0441  BP:  111/70 131/77 115/66  Pulse:  (!) 127 (!) 114 (!) 110  Resp: 20 18 19    Temp:  98.3 F (36.8 C) 98.5 F (36.9 C) 98 F (36.7 C)  TempSrc:  Oral Oral Oral  SpO2:  99% 98% 95%  Weight:      Height:        Examination:  GENERAL: No apparent distress.  Nontoxic. HEENT: MMM.  Vision and hearing grossly intact.  NECK: Supple.  No apparent JVD.  RESP:  No IWOB.  Fair aeration bilaterally. CVS:  RRR. Heart sounds normal.  ABD/GI/GU: BS+. Abd soft, NTND.  MSK/EXT:  Moves extremities. No apparent deformity.  Trace BLE edema. SKIN: no apparent skin lesion or  wound NEURO: Awake and alert. Oriented appropriately.  No apparent focal neuro deficit. PSYCH: Calm. Normal affect.   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  Patient had RUL lung wedge resection with clear margins in [place] in 2020.  Pathology at that time showed moderately differentiated adenocarcinoma of the lung.  CT angio chest showed large right suprahilar mass extending continuously into mediastinum and measuring about 6.3 cm maximal diameter with mets to liver, tail of pancreas, left anterior fifth ribs and multiple vertebral bodies as well as the left breast tissue.  -Radiation oncology started radiation on 7/17. -Oncology ordered CT biopsy to confirm primary etiology of her malignancy and for molecular studies.  However, prognosis felt to be very poor, and palliative consultation recommended.   Cancer related pain: Continues to endorse significant pain.  Was not able to do radiation or lie flat without IV pain medication.   -Aggressive bowel regimen -Appreciate help by oncology and palliative medicine.  I agree with adding Decadron and increasing gabapentin -DME including hospital bed, walker and bedside commode ordered.   Recent pulmonary embolism:bilateral PE diagnosed on 08/22/21.  -Continue Eliquis.   Left arm swelling: reported on 7/13.  CT left humerus on 7/14 showed 3.7 cm left infraspinatus muscle, 6.2 cm left biceps muscle and other multiple soft tissue masses along the left chest wall and within the left breast soft tissues consistent with metastatic  disease   Sinus tachycardia: Likely due to pain and underlying PE. -Increased metoprolol to 50 mg twice daily  Anemia of chronic disease: Likely due to malignancy. Recent Labs    08/21/21 2137 08/22/21 0325 08/23/21 0149 08/24/21 0058 08/25/21 0033 09/04/2021 2149 09/04/21 0500 09/06/21 0423 09/11/21 0409  HGB 10.5* 9.2* 9.6* 9.7* 10.0* 9.6* 8.9* 9.1* 8.6*  -Continue monitoring -Check anemia panel  Physical deconditioning -Therapy recommended home health.   -Hospital bed, RW and 3 and 1 ordered.  Lactic acidosis: Likely type B.  Elevated AST: CK within normal.  Bandemia: Likely from malignancy and demargination. -Monitor  Body mass index is 27.32 kg/m.   DVT prophylaxis:   apixaban (ELIQUIS) tablet 5 mg  Code Status: Full code Family Communication: None at bedside today. Level of care: Telemetry Status is: Inpatient Remains inpatient appropriate because: Intractable cancer related pain   Final disposition: Likely home Consultants:  Radiation oncology Palliative medicine Interventional radiology  Sch Meds:  Scheduled Meds:  apixaban  5 mg Oral BID   fentaNYL  1 patch Transdermal Q72H   gabapentin  200 mg Oral TID   latanoprost  1 drop Both Eyes QHS   lidocaine  1 patch Transdermal Q24H   lidocaine       metoprolol tartrate  50 mg Oral BID   oxyCODONE  40 mg Oral Q12H   senna  1 tablet Oral QHS   sodium chloride flush  3 mL Intravenous Q12H   traZODone  100 mg Oral QHS   Continuous Infusions: PRN Meds:.acetaminophen **OR** acetaminophen, bisacodyl, HYDROmorphone (DILAUDID) injection, lidocaine, magnesium hydroxide, ondansetron **OR** ondansetron (ZOFRAN) IV, mouth rinse, oxyCODONE-acetaminophen, polyethylene glycol, senna-docusate  Antimicrobials: Anti-infectives (From admission, onward)    None        I have personally reviewed the following labs and images: CBC: Recent Labs  Lab 09/11/21 0409  WBC 17.8*  HGB 8.6*  HCT 27.6*  MCV  76.7*  PLT 417*   BMP &GFR Recent Labs  Lab 09/11/21 0409  NA 140  K 4.8  CL 100  CO2 28  GLUCOSE 95  BUN 18  CREATININE 0.57  CALCIUM 9.4  MG 2.3  PHOS 3.7   Estimated Creatinine Clearance: 88.7 mL/min (by C-G formula based on SCr of 0.57 mg/dL). Liver & Pancreas: Recent Labs  Lab 09/11/21 0409  AST 61*  ALT 35  ALKPHOS 481*  BILITOT 0.8  PROT 6.7  ALBUMIN 2.5*   No results for input(s): "LIPASE", "AMYLASE" in the last 168 hours. No results for input(s): "AMMONIA" in the last 168 hours. Diabetic: No results for input(s): "HGBA1C" in the last 72 hours. No results for input(s): "GLUCAP" in the last 168 hours. Cardiac Enzymes: Recent Labs  Lab 09/11/21 0409  CKTOTAL 41   No results for input(s): "PROBNP" in the last 8760 hours. Coagulation Profile: No results for input(s): "INR", "PROTIME" in the last 168 hours. Thyroid Function Tests: No results for input(s): "TSH", "T4TOTAL", "FREET4", "T3FREE", "THYROIDAB" in the last 72 hours.  Lipid Profile: No results for input(s): "CHOL", "HDL", "LDLCALC", "  TRIG", "CHOLHDL", "LDLDIRECT" in the last 72 hours. Anemia Panel: No results for input(s): "VITAMINB12", "FOLATE", "FERRITIN", "TIBC", "IRON", "RETICCTPCT" in the last 72 hours. Urine analysis:    Component Value Date/Time   COLORURINE YELLOW 02/14/2010 0600   APPEARANCEUR CLEAR 02/14/2010 0600   LABSPEC 1.028 02/14/2010 0600   PHURINE 5.5 02/14/2010 0600   GLUCOSEU NEGATIVE 02/14/2010 0600   HGBUR NEGATIVE 02/14/2010 0600   BILIRUBINUR LARGE (A) 02/14/2010 0600   KETONESUR NEGATIVE 02/14/2010 0600   PROTEINUR NEGATIVE 02/14/2010 0600   UROBILINOGEN 1.0 02/14/2010 0600   NITRITE NEGATIVE 02/14/2010 0600   LEUKOCYTESUR  02/14/2010 0600    NEGATIVE MICROSCOPIC NOT DONE ON URINES WITH NEGATIVE PROTEIN, BLOOD, LEUKOCYTES, NITRITE, OR GLUCOSE <1000 mg/dL.   Sepsis Labs: Invalid input(s): "PROCALCITONIN", "LACTICIDVEN"  Microbiology: No results found for this  or any previous visit (from the past 240 hour(s)).  Radiology Studies: No results found.    Jmari Pelc T. [place]  If 7PM-7AM, please contact night-coverage [email or web address] 09/13/2021, 3:34 PM

## 2021-09-13 NOTE — Progress Notes (Signed)
Daily Progress Note   Patient Name: [name]       Date: 09/13/2021 DOB: 27-Sep-1965  Age: 56 y.o. MRN#: [id number] Attending Physician: Mercy Riding, MD Primary Care Physician: Pcp, No Admit Date: 09/23/2021  Reason for Consultation/Follow-up: Establishing goals of care  Subjective: Patient feels that pain is better controlled today. Feels she tolerated increased neurontin. Decadron not yet administered. HCPOA has been notarized. No other concerns.   Length of Stay: 9  Current Medications: Scheduled Meds:   apixaban  5 mg Oral BID   fentaNYL  1 patch Transdermal Q72H   gabapentin  200 mg Oral TID   latanoprost  1 drop Both Eyes QHS   lidocaine  1 patch Transdermal Q24H   metoprolol tartrate  50 mg Oral BID   oxyCODONE  40 mg Oral Q12H   senna  1 tablet Oral QHS   sodium chloride flush  3 mL Intravenous Q12H   traZODone  100 mg Oral QHS    Continuous Infusions:   PRN Meds: acetaminophen **OR** acetaminophen, bisacodyl, HYDROmorphone (DILAUDID) injection, magnesium hydroxide, ondansetron **OR** ondansetron (ZOFRAN) IV, mouth rinse, oxyCODONE-acetaminophen, polyethylene glycol, senna-docusate  Physical Exam Constitutional:      General: She is not in acute distress.    Appearance: She is ill-appearing.  Pulmonary:     Effort: Pulmonary effort is normal.  Skin:    General: Skin is warm and dry.  Neurological:     Mental Status: She is alert.             Vital Signs: BP 115/66 (BP Location: Left Arm)   Pulse (!) 110   Temp 98 F (36.7 C) (Oral)   Resp 19   Ht 5\' 9"  (1.753 m)   Wt 83.9 kg   SpO2 95%   BMI 27.32 kg/m  SpO2: SpO2: 95 % O2 Device: O2 Device: Room Air O2 Flow Rate: O2 Flow Rate (L/min): 2 L/min  Intake/output summary:  Intake/Output Summary (Last 24  hours) at 09/13/2021 1433 Last data filed at 09/13/2021 1100 Gross per 24 hour  Intake 480 ml  Output --  Net 480 ml   LBM: Last BM Date : 09/12/21 Baseline Weight: Weight: 83.9 kg Most recent weight: Weight: 83.9 kg       Palliative Assessment/Data: PPS 50%      Patient Active Problem List   Diagnosis Date Noted   Sinus tachycardia 09/12/2021   Anemia of chronic disease 09/11/2021   Bandemia 09/11/2021   Cancer related pain 09/10/2021   Lactic acidosis 09/10/2021   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[place]) 09/06/2021   Pain from bone metastases (HCC) 09/06/2021   Chest wall pain 09/04/2021   Metastatic adenocarcinoma ([place]) 09/04/2021   Intractable pain 09/04/2021   Pulmonary embolism ([place]) 08/22/2021   Acute pulmonary embolism, unspecified pulmonary embolism type, unspecified whether acute cor pulmonale present ([place]) 08/22/2021   Primary cancer of right upper lobe of lung ([place]) 07/08/2018   Urinary incontinence 04/05/2011    Palliative Care Assessment & Plan   HPI: 56 y.o. female  with past medical history of NSCLC with significant mets to lung, mediastinum, liver, bone, breast, pancreas as well as kidney, soft tissue and muscular lesions  admitted on 09/01/2021 with uncontrolled pain. Radiation oncology consulted, and patient started radiation treatment on 7/17. She has been unable to tolerate radiation without IV pain medication. Of note, admitted in June/end of July with PE now on eliquis. PMT consulted to discuss [place].   Assessment: Symptoms better controlled today  Recommendations/Plan: RN to administer decadron - not yet done on my assessment Patient tolerating increased neurontin Continue scheduled senna To see outpatient palliative at cancer center [name] named as [place] was discussed with patient and sister, RN  Thank you for allowing the Palliative Medicine Team to assist in the care of this patient.   *Please note that this is a verbal dictation  therefore any spelling or grammatical errors are due to the "[place] [employer]" system interpretation.  Juel Burrow, DNP, [facility] Palliative Medicine Team Team Phone # [phone]  Pager [phone]

## 2021-09-14 DIAGNOSIS — R52 Pain, unspecified: Secondary | ICD-10-CM | POA: Diagnosis not present

## 2021-09-14 DIAGNOSIS — R0789 Other chest pain: Secondary | ICD-10-CM | POA: Diagnosis not present

## 2021-09-14 DIAGNOSIS — C7989 Secondary malignant neoplasm of other specified sites: Secondary | ICD-10-CM

## 2021-09-14 DIAGNOSIS — C801 Malignant (primary) neoplasm, unspecified: Secondary | ICD-10-CM | POA: Diagnosis not present

## 2021-09-14 DIAGNOSIS — R000 Tachycardia, unspecified: Secondary | ICD-10-CM

## 2021-09-14 DIAGNOSIS — R2232 Localized swelling, mass and lump, left upper limb: Secondary | ICD-10-CM

## 2021-09-14 DIAGNOSIS — G893 Neoplasm related pain (acute) (chronic): Secondary | ICD-10-CM | POA: Diagnosis not present

## 2021-09-14 DIAGNOSIS — D72825 Bandemia: Secondary | ICD-10-CM

## 2021-09-14 DIAGNOSIS — E872 Acidosis, unspecified: Secondary | ICD-10-CM

## 2021-09-14 DIAGNOSIS — R7401 Elevation of levels of liver transaminase levels: Secondary | ICD-10-CM

## 2021-09-14 DIAGNOSIS — D638 Anemia in other chronic diseases classified elsewhere: Secondary | ICD-10-CM

## 2021-09-14 DIAGNOSIS — Z86711 Personal history of pulmonary embolism: Secondary | ICD-10-CM

## 2021-09-14 LAB — COMPREHENSIVE METABOLIC PANEL
ALT: 31 U/L (ref 0–44)
AST: 57 U/L — ABNORMAL HIGH (ref 15–41)
Albumin: 2.7 g/dL — ABNORMAL LOW (ref 3.5–5.0)
Alkaline Phosphatase: 530 U/L — ABNORMAL HIGH (ref 38–126)
Anion gap: 15 (ref 5–15)
BUN: 22 mg/dL — ABNORMAL HIGH (ref 6–20)
CO2: 27 mmol/L (ref 22–32)
Calcium: 9.6 mg/dL (ref 8.9–10.3)
Chloride: 97 mmol/L — ABNORMAL LOW (ref 98–111)
Creatinine, Ser: 0.64 mg/dL (ref 0.44–1.00)
GFR, Estimated: >60 mL/min (ref >60)
Glucose, Bld: 119 mg/dL — ABNORMAL HIGH (ref 70–99)
Potassium: 4.6 mmol/L (ref 3.5–5.1)
Sodium: 139 mmol/L (ref 135–145)
Total Bilirubin: 0.7 mg/dL (ref 0.3–1.2)
Total Protein: 7.6 g/dL (ref 6.5–8.1)

## 2021-09-14 LAB — CBC
HCT: 30.8 % — ABNORMAL LOW (ref 36.0–46.0)
Hemoglobin: 9.4 g/dL — ABNORMAL LOW (ref 12.0–15.0)
MCH: 23.3 pg — ABNORMAL LOW (ref 26.0–34.0)
MCHC: 30.5 g/dL (ref 30.0–36.0)
MCV: 76.4 fL — ABNORMAL LOW (ref 80.0–100.0)
Platelets: 436 10*3/uL — ABNORMAL HIGH (ref 150–400)
RBC: 4.03 MIL/uL (ref 3.87–5.11)
RDW: 16.8 % — ABNORMAL HIGH (ref 11.5–15.5)
WBC: 14.5 10*3/uL — ABNORMAL HIGH (ref 4.0–10.5)
nRBC: 0.3 % — ABNORMAL HIGH (ref 0.0–0.2)

## 2021-09-14 LAB — MAGNESIUM: Magnesium: 2.3 mg/dL (ref 1.7–2.4)

## 2021-09-14 LAB — PHOSPHORUS: Phosphorus: 3.4 mg/dL (ref 2.5–4.6)

## 2021-09-14 NOTE — Progress Notes (Signed)
Subjective: The patient is seen and examined today.  Her daughter was at the bedside.  She is feeling much better today with less pain.  She has been tolerating her palliative radiotherapy fairly well.  She had ultrasound-guided core biopsy of left biceps soft tissue mass.  The final pathology is still pending.  She denied having any current fever or chills.  She has no nausea, vomiting, diarrhea or constipation.  She has no headache or visual changes.  Objective: Vital signs in last 24 hours: Temp:  [97.9 F (36.6 C)-98.3 F (36.8 C)] 97.9 F (36.6 C) (07/22 0607) Pulse Rate:  [97-110] 97 (07/22 0607) Resp:  [14-21] 14 (07/22 0607) BP: (110-125)/(72-78) 110/72 (07/22 0607) SpO2:  [97 %-100 %] 99 % (07/22 0607)  Intake/Output from previous day: 07/21 0701 - 07/22 0700 In: 597 [P.O.:597] Out: -  Intake/Output this shift: No intake/output data recorded.  General appearance: alert, cooperative, fatigued, and no distress Resp: clear to auscultation bilaterally Cardio: regular rate and rhythm, S1, S2 normal, no murmur, click, rub or gallop GI: soft, non-tender; bowel sounds normal; no masses,  no organomegaly Extremities: extremities normal, atraumatic, no cyanosis or edema  Lab Results:  Recent Labs    09/14/21 0429  WBC 14.5*  HGB 9.4*  HCT 30.8*  PLT 436*   BMET Recent Labs    09/14/21 0429  NA 139  K 4.6  CL 97*  CO2 27  GLUCOSE 119*  BUN 22*  CREATININE 0.64  CALCIUM 9.6    Studies/Results: [place] CORE BIOPSY (SOFT TISSUE)  Result Date: 09/13/2021 INDICATION: 56 year old female with right suprahilar lung mass and multifocal soft tissue masses concerning for metastases. EXAM: Ultrasound-guided left biceps soft tissue mass biopsy MEDICATIONS: None. ANESTHESIA/SEDATION: Moderate (conscious) sedation was employed during this procedure. A total of Versed 1 mg and Fentanyl 50 mcg was administered intravenously. Moderate Sedation Time: 10 minutes. The patient's level of  consciousness and vital signs were monitored continuously by radiology nursing throughout the procedure under my direct supervision. FLUOROSCOPY TIME:  None. COMPLICATIONS: None immediate. PROCEDURE: Informed written consent was obtained from the patient after a thorough discussion of the procedural risks, benefits and alternatives. All questions were addressed. Maximal Sterile Barrier Technique was utilized including caps, mask, sterile gowns, sterile gloves, sterile drape, hand hygiene and skin antiseptic. A timeout was performed prior to the initiation of the procedure. The left upper extremity was prepped and draped in standard fashion. Preprocedure ultrasound evaluation demonstrated large intramuscular soft tissue mass within the anterior left biceps muscle, compatible with findings on recent comparison left upper extremity CT. The procedure was planned. Subdermal Local anesthesia was administered at the planned needle entry site with 1% lidocaine. Deeper local anesthetic was administered under direct ultrasound visualization along the periphery of the soft tissue mass. A small skin nick was made. Next, under direct ultrasound visualization, a 17 gauge coaxial introducer needle was advanced to the periphery of the mass. A total of 3, 18 gauge core biopsies were obtained. The samples were placed in formalin. The introducer needle was removed and hemostasis was achieved with brief manual compression. The patient tolerated the procedure well was transferred back to the floor in stable condition. IMPRESSION: Technically successful ultrasound-guided left biceps soft tissue mass core biopsy. [name], MD [facility] Electronically Signed   By: [name] M.D.   On: 09/13/2021 16:38    Medications: I have reviewed the patient's current medications.   Assessment/Plan: This is a very  pleasant 56 years old African-American female with history of  stage Ib (T2 a, N0, M0) non-small cell lung cancer, adenocarcinoma with positive EGFR mutation diagnosed in May 2020 status post right upper lobectomy with lymph node dissection followed by adjuvant treatment with osimertinib for around 3 years. The patient is currently presenting with very aggressive metastatic disease of unclear etiology probably lung but other etiology cannot be completely excluded including upper gastrointestinal, pancreatic or even breast diagnosed and May 2023 but the tissue biopsy was not sufficient to identify the primary etiology. She has significant metastasis including the lung, mediastinum, liver, bone, breast, pancreas as well as kidney, soft tissue and muscular lesions. The patient was also recently diagnosed with bilateral pulmonary embolism and currently on treatment with Eliquis. The patient recently underwent ultrasound-guided core biopsy of left biceps soft tissue mass by interventional radiology but the final pathology is still pending. We will wait for the pathology report before giving any additional recommendation regarding treatment of her condition. I am still waiting to hear from her oncologist in [place] regarding her molecular study by the blood test. For the pain management from the metastatic bone disease, she will continue her palliative radiotherapy under the care of Dr. [name].  She will also continue her current treatment with fentanyl patch, Dilaudid, Neurontin and OxyContin as well as Percocet. I will arrange for the patient to have a follow-up appointment with me at the cancer center after discharge for more detailed discussion of her treatment options based on the final pathology and molecular studies. The patient may benefit from having MRI of the brain performed during her hospitalization to rule out any brain metastasis. Thank you for taking good care of [name], I will continue to follow-up the patient with you and assist in her management on  as-needed basis.   LOS: 10 days    [name] 09/14/2021

## 2021-09-14 NOTE — Progress Notes (Signed)
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  In ED, afebrile but tachycardic.  WBC 17.3 with left shift.  Hgb 9.6.  ALP elevated to 426.  CXR with stable right healer and suprahilar mass and a stable right basilar opacity.  Patient was started on IV pain medication and admitted for pain control.    Radiation oncology consulted, and patient started radiation treatment on 7/17.  Pain control remains barriers to discharge.  Oncology and palliative medicine consulted.   Patient underwent US-guided biopsy of LUE soft tissue mass on 7/21.  Subjective: Seen and examined earlier this morning.  No major events overnight of this morning.  Pain well controlled at the moment.  Still requiring IV Dilaudid when she lies down or during radiation treatment.  Objective: Vitals:   09/13/21 1625 09/13/21 1938 09/14/21 0607 09/14/21 1504  BP: 116/78 125/73 110/72 105/74  Pulse: (!) 110 (!) 108 97 97  Resp: (!) 21 18 14 16   Temp:  98.3 F (36.8 C) 97.9 F (36.6 C) 98 F (36.7 C)  TempSrc:  Oral Oral Oral  SpO2: 97% 100% 99% 96%  Weight:      Height:        Examination:  GENERAL: No apparent distress.  Nontoxic. HEENT: MMM.  Vision and hearing grossly intact.  NECK: Supple.  No apparent JVD.  RESP:  No IWOB.  Fair aeration bilaterally. CVS:  RRR. Heart sounds normal.  ABD/GI/GU: BS+. Abd soft, NTND.  MSK/EXT:  Moves extremities. No apparent deformity.  Trace BLE edema. SKIN: no apparent skin lesion or wound NEURO: Awake and alert. Oriented  appropriately.  No apparent focal neuro deficit. PSYCH: Calm. Normal affect.   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  Patient had RUL lung wedge resection with clear margins in [place] in 2020.  Pathology at that time showed moderately differentiated adenocarcinoma of the lung.  CT angio chest showed large right suprahilar mass extending continuously into mediastinum and measuring about 6.3 cm maximal diameter with mets to liver, tail of pancreas, left anterior fifth ribs and multiple vertebral bodies as well as the left breast tissue.  -Started radiation on 7/17. -US guided biopsy of LUE soft tissue mass on 7/21 -Oncology recommends MRI brain prior to discharge  Cancer related pain: Continues to endorse significant pain.  Was not able to do radiation or lie flat without IV pain medication.   -Aggressive bowel regimen -Appreciate help by oncology and palliative medicine.  I agree with adding Decadron and increasing gabapentin -DME including hospital bed, walker and bedside commode ordered.    Left arm swelling: reported on 7/13.  CT left humerus on 7/14 showed 3.7 cm left infraspinatus muscle, 6.2 cm left biceps muscle and other multiple soft tissue masses along the left chest wall and within the left breast soft tissues consistent with metastatic disease  Recent pulmonary embolism:bilateral PE diagnosed on 08/22/21.  -Continue Eliquis.  Sinus tachycardia: Likely due to pain and underlying PE.  Improved. -Continue metoprolol 50 mg twice daily  Anemia of chronic disease:  Likely due to malignancy. Recent Labs    08/21/21 2137 08/22/21 0325 08/23/21 0149 08/24/21 0058 08/25/21 0033 09/07/2021 2149 09/04/21 0500 09/06/21 0423 09/11/21 0409 09/14/21 0429  HGB 10.5* 9.2* 9.6* 9.7* 10.0* 9.6* 8.9* 9.1* 8.6* 9.4*  -Continue monitoring -Check anemia panel  Physical deconditioning -Therapy recommended home health.   -Hospital bed, RW and 3 and 1 ordered.  Lactic acidosis: Likely type B.  Elevated AST: CK within normal.  Bandemia: Likely from malignancy and demargination. -Monitor  Body mass index is 27.32 kg/m.   DVT prophylaxis:   apixaban (ELIQUIS) tablet 5 mg  Code Status: Full code Family Communication: Updated patient's niece at bedside Level of care: Telemetry Status is: Inpatient Remains inpatient appropriate because: Intractable cancer pain   Final disposition: Likely home with HH and DME Consultants:  Radiation oncology Palliative medicine Interventional radiology  Sch Meds:  Scheduled Meds:  apixaban  5 mg Oral BID   fentaNYL  1 patch Transdermal Q72H   gabapentin  200 mg Oral TID   latanoprost  1 drop Both Eyes QHS   lidocaine  1 patch Transdermal Q24H   metoprolol tartrate  50 mg Oral BID   oxyCODONE  40 mg Oral Q12H   senna  1 tablet Oral QHS   sodium chloride flush  3 mL Intravenous Q12H   traZODone  100 mg Oral QHS   Continuous Infusions: PRN Meds:.acetaminophen **OR** acetaminophen, bisacodyl, HYDROmorphone (DILAUDID) injection, magnesium hydroxide, ondansetron **OR** ondansetron (ZOFRAN) IV, mouth rinse, oxyCODONE-acetaminophen, polyethylene glycol, senna-docusate  Antimicrobials: Anti-infectives (From admission, onward)    None        I have personally reviewed the following labs and images: CBC: Recent Labs  Lab 09/11/21 0409 09/14/21 0429  WBC 17.8* 14.5*  HGB 8.6* 9.4*  HCT 27.6* 30.8*  MCV 76.7* 76.4*  PLT 417* 436*   BMP &GFR Recent Labs  Lab 09/11/21 0409 09/14/21 0429  NA 140 139   K 4.8 4.6  CL 100 97*  CO2 28 27  GLUCOSE 95 119*  BUN 18 22*  CREATININE 0.57 0.64  CALCIUM 9.4 9.6  MG 2.3 2.3  PHOS 3.7 3.4   Estimated Creatinine Clearance: 88.7 mL/min (by C-G formula based on SCr of 0.64 mg/dL). Liver & Pancreas: Recent Labs  Lab 09/11/21 0409 09/14/21 0429  AST 61* 57*  ALT 35 31  ALKPHOS 481* 530*  BILITOT 0.8 0.7  PROT 6.7 7.6  ALBUMIN 2.5* 2.7*   No results for input(s): "LIPASE", "AMYLASE" in the last 168 hours. No results for input(s): "AMMONIA" in the last 168 hours. Diabetic: No results for input(s): "HGBA1C" in the last 72 hours. No results for input(s): "GLUCAP" in the last 168 hours. Cardiac Enzymes: Recent Labs  Lab 09/11/21 0409  CKTOTAL 41   No results for input(s): "PROBNP" in the last 8760 hours. Coagulation Profile: No results for input(s): "INR", "PROTIME" in the last 168 hours. Thyroid Function Tests: No results for input(s): "TSH", "T4TOTAL", "FREET4", "T3FREE", "THYROIDAB" in the last 72 hours.  Lipid Profile: No results for input(s): "CHOL", "HDL", "LDLCALC", "TRIG", "  CHOLHDL", "LDLDIRECT" in the last 72 hours. Anemia Panel: No results for input(s): "VITAMINB12", "FOLATE", "FERRITIN", "TIBC", "IRON", "RETICCTPCT" in the last 72 hours. Urine analysis:    Component Value Date/Time   COLORURINE YELLOW 02/14/2010 0600   APPEARANCEUR CLEAR 02/14/2010 0600   LABSPEC 1.028 02/14/2010 0600   PHURINE 5.5 02/14/2010 0600   GLUCOSEU NEGATIVE 02/14/2010 0600   HGBUR NEGATIVE 02/14/2010 0600   BILIRUBINUR LARGE (A) 02/14/2010 0600   KETONESUR NEGATIVE 02/14/2010 0600   PROTEINUR NEGATIVE 02/14/2010 0600   UROBILINOGEN 1.0 02/14/2010 0600   NITRITE NEGATIVE 02/14/2010 0600   LEUKOCYTESUR  02/14/2010 0600    NEGATIVE MICROSCOPIC NOT DONE ON URINES WITH NEGATIVE PROTEIN, BLOOD, LEUKOCYTES, NITRITE, OR GLUCOSE <1000 mg/dL.   Sepsis Labs: Invalid input(s): "PROCALCITONIN", "LACTICIDVEN"  Microbiology: No results found for  this or any previous visit (from the past 240 hour(s)).  Radiology Studies: [place] CORE BIOPSY (SOFT TISSUE)  Result Date: 09/13/2021 INDICATION: 56 year old female with right suprahilar lung mass and multifocal soft tissue masses concerning for metastases. EXAM: Ultrasound-guided left biceps soft tissue mass biopsy MEDICATIONS: None. ANESTHESIA/SEDATION: Moderate (conscious) sedation was employed during this procedure. A total of Versed 1 mg and Fentanyl 50 mcg was administered intravenously. Moderate Sedation Time: 10 minutes. The patient's level of consciousness and vital signs were monitored continuously by radiology nursing throughout the procedure under my direct supervision. FLUOROSCOPY TIME:  None. COMPLICATIONS: None immediate. PROCEDURE: Informed written consent was obtained from the patient after a thorough discussion of the procedural risks, benefits and alternatives. All questions were addressed. Maximal Sterile Barrier Technique was utilized including caps, mask, sterile gowns, sterile gloves, sterile drape, hand hygiene and skin antiseptic. A timeout was performed prior to the initiation of the procedure. The left upper extremity was prepped and draped in standard fashion. Preprocedure ultrasound evaluation demonstrated large intramuscular soft tissue mass within the anterior left biceps muscle, compatible with findings on recent comparison left upper extremity CT. The procedure was planned. Subdermal Local anesthesia was administered at the planned needle entry site with 1% lidocaine. Deeper local anesthetic was administered under direct ultrasound visualization along the periphery of the soft tissue mass. A small skin nick was made. Next, under direct ultrasound visualization, a 17 gauge coaxial introducer needle was advanced to the periphery of the mass. A total of 3, 18 gauge core biopsies were obtained. The samples were placed in formalin. The introducer needle was removed and hemostasis was  achieved with brief manual compression. The patient tolerated the procedure well was transferred back to the floor in stable condition. IMPRESSION: Technically successful ultrasound-guided left biceps soft tissue mass core biopsy. [name], MD [facility] Radiology Electronically Signed   By: [name] M.D.   On: 09/13/2021 16:38      Kathryn Linarez T. Lockridge  If 7PM-7AM, please contact night-coverage [email or web address] 09/14/2021, 4:02 PM

## 2021-09-14 NOTE — Plan of Care (Signed)

## 2021-09-15 DIAGNOSIS — D638 Anemia in other chronic diseases classified elsewhere: Secondary | ICD-10-CM | POA: Diagnosis not present

## 2021-09-15 DIAGNOSIS — C7989 Secondary malignant neoplasm of other specified sites: Secondary | ICD-10-CM | POA: Diagnosis not present

## 2021-09-15 DIAGNOSIS — G893 Neoplasm related pain (acute) (chronic): Secondary | ICD-10-CM | POA: Diagnosis not present

## 2021-09-15 DIAGNOSIS — R52 Pain, unspecified: Secondary | ICD-10-CM | POA: Diagnosis not present

## 2021-09-15 DIAGNOSIS — C801 Malignant (primary) neoplasm, unspecified: Secondary | ICD-10-CM | POA: Diagnosis not present

## 2021-09-15 DIAGNOSIS — R000 Tachycardia, unspecified: Secondary | ICD-10-CM | POA: Diagnosis not present

## 2021-09-15 DIAGNOSIS — R0789 Other chest pain: Secondary | ICD-10-CM | POA: Diagnosis not present

## 2021-09-15 MED ORDER — DEXAMETHASONE SODIUM PHOSPHATE 4 MG/ML IJ SOLN
4.0000 mg | Freq: Two times a day (BID) | INTRAMUSCULAR | Status: DC
  Administered 2021-09-15: 4 mg via INTRAVENOUS
  Filled 2021-09-15: qty 1

## 2021-09-15 MED ORDER — DEXAMETHASONE SODIUM PHOSPHATE 4 MG/ML IJ SOLN
4.0000 mg | Freq: Once | INTRAMUSCULAR | Status: AC
  Administered 2021-09-15: 4 mg via INTRAVENOUS
  Filled 2021-09-15: qty 1

## 2021-09-15 MED ORDER — CELECOXIB 200 MG PO CAPS
200.0000 mg | ORAL_CAPSULE | Freq: Two times a day (BID) | ORAL | Status: DC
  Administered 2021-09-15 – 2021-10-01 (×32): 200 mg via ORAL
  Filled 2021-09-15 (×33): qty 1

## 2021-09-15 MED ORDER — FENTANYL 50 MCG/HR TD PT72
1.0000 | MEDICATED_PATCH | TRANSDERMAL | Status: DC
  Administered 2021-09-15: 1 via TRANSDERMAL
  Filled 2021-09-15: qty 1

## 2021-09-15 MED ORDER — DEXAMETHASONE SODIUM PHOSPHATE 10 MG/ML IJ SOLN
8.0000 mg | INTRAMUSCULAR | Status: DC
Start: 2021-09-16 — End: 2021-10-01
  Administered 2021-09-16 – 2021-10-01 (×16): 8 mg via INTRAVENOUS
  Filled 2021-09-15 (×16): qty 1

## 2021-09-15 MED ORDER — FAMOTIDINE 20 MG PO TABS
20.0000 mg | ORAL_TABLET | Freq: Every day | ORAL | Status: DC
  Administered 2021-09-15 – 2021-09-30 (×16): 20 mg via ORAL
  Filled 2021-09-15 (×17): qty 1

## 2021-09-15 MED ORDER — LORAZEPAM 2 MG/ML IJ SOLN
1.0000 mg | Freq: Once | INTRAMUSCULAR | Status: AC | PRN
  Administered 2021-09-17: 1 mg via INTRAVENOUS
  Filled 2021-09-15: qty 1

## 2021-09-15 NOTE — TOC Progression Note (Signed)
Transition of Care [facility]) - Progression Note    Patient Details  Name: [name] MRN: [id number] Date of Birth: 12/24/1965  Transition of Care [facility] CM/SW Contact  Ross Ludwig, [place] Phone Number: 09/15/2021, 4:10 PM  Clinical Narrative:     CSW was informed that patient and family use [employer] for oxygen.  They would like to use [employer] for hospital bed, 3 in 1, and rolling walker as well.  CSW contacted Jermaine at [facility] to let him know that family is requesting the DME.  Orders are in for the DME, Jermaine to follow up with weekday [facility] case manager Velva Harman on Monday to get an updated on when patient is medically ready for discharge.  TOC to continue to follow patient's progress throughout discharge planning.   Expected Discharge Plan: Home/Self Care Barriers to Discharge: Continued Medical Work up  Expected Discharge Plan and Services Expected Discharge Plan: Home/Self Care   Discharge Planning Services: CM Consult   Living arrangements for the past 2 months: Single Family Home                                       Social Determinants of Health (SDOH) Interventions    Readmission Risk Interventions    09/10/2021    2:32 PM  Readmission Risk Prevention Plan  Transportation Screening Complete  PCP or Specialist Appt within 5-7 Days Complete  Home Care Screening Complete  Medication Review (RN CM) Complete

## 2021-09-15 NOTE — Progress Notes (Signed)
Palliative Care Progress Note  Considerable amount of pain this AM, she is OOB in a chair, but HR is up and she is having severe pain. Unintentional missed dose of steroid yesterday likely set her back today-these have been resumed. No other complaints.  Recommendations:  Decadron effective in once daily dose- will give additional 4 mg now and start at 8 mg tomorrow for a couple of days with a slow taper.  Increased Fentanyl Patch to 50 mcg. Maintain Oxycodone dosing for now. Celebrex 200mg  PO BID + GI protection  Will follow for dose titration needs.  Lane Hacker, DO Palliative Medicine  Time: 35 min

## 2021-09-15 NOTE — Progress Notes (Signed)
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  In ED, afebrile but tachycardic.  WBC 17.3 with left shift.  Hgb 9.6.  ALP elevated to 426.  CXR with stable right healer and suprahilar mass and a stable right basilar opacity.  Patient was started on IV pain medication and admitted for pain control.    Radiation oncology consulted, and patient started radiation treatment on 7/17.  Pain control remains barriers to discharge.  Oncology and palliative medicine consulted.   Patient underwent US-guided biopsy of LUE soft tissue mass on 7/21.  Subjective: Seen and examined earlier this morning.  She states "every bone from waist up hurting".  No other complaints.  Objective: Vitals:   09/14/21 0607 09/14/21 1504 09/14/21 2100 09/15/21 0651  BP: 110/72 105/74 134/79 117/85  Pulse: 97 97 (!) 120 (!) 109  Resp: 14 16    Temp: 97.9 F (36.6 C) 98 F (36.7 C) 98.4 F (36.9 C) 98.5 F (36.9 C)  TempSrc: Oral Oral Oral Oral  SpO2: 99% 96% 98% 98%  Weight:      Height:        Examination:  GENERAL: No apparent distress.  Nontoxic. HEENT: MMM.  Vision and hearing grossly intact.  NECK: Supple.  No apparent JVD.  RESP:  No IWOB.  Fair aeration bilaterally. CVS:  RRR. Heart sounds normal.  ABD/GI/GU: BS+. Abd soft, NTND.  MSK/EXT:  Moves extremities. No apparent deformity.  Trace BLE edema. SKIN: no apparent skin lesion or wound NEURO: Awake and alert. Oriented appropriately.  No apparent focal neuro deficit. PSYCH: Calm. Normal affect.    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  Patient had RUL lung wedge resection with clear margins in [place] in 2020.  Pathology at that time showed moderately differentiated adenocarcinoma of the lung.  CT angio chest showed large right suprahilar mass extending continuously into mediastinum and measuring about 6.3 cm maximal diameter with mets to liver, tail of pancreas, left anterior fifth ribs and multiple vertebral bodies as well as the left breast tissue.  -Started radiation on 7/17. -US guided biopsy of LUE soft tissue mass on 7/21 -Oncology recommends MRI brain prior to discharge-ordered.  Cancer related pain: Still with significant pain -Appreciate help by palliative medicine about pain management -Aggressive bowel regimen -DME including hospital bed, walker and bedside commode ordered.   Left arm swelling: reported on 7/13.  CT left humerus on 7/14 showed 3.7 cm left infraspinatus muscle, 6.2 cm left biceps muscle and other multiple soft tissue masses along the left chest wall and within the left breast soft tissues consistent with metastatic disease  Recent pulmonary embolism:bilateral PE diagnosed on 08/22/21.  -Continue Eliquis.  Sinus tachycardia: Likely due to pain and underlying PE.  Improved. -Continue metoprolol 50 mg twice daily  Anemia of chronic disease: Likely due to malignancy. Recent Labs    08/21/21 2137 08/22/21 0325 08/23/21 0149 08/24/21 0058 08/25/21 0033 08/25/2021 2149 09/04/21 0500 09/06/21 0423 09/11/21 0409 09/14/21 0429  HGB  10.5* 9.2* 9.6* 9.7* 10.0* 9.6* 8.9* 9.1* 8.6* 9.4*  -Continue monitoring -Check anemia panel  Physical deconditioning -Therapy recommended home health.   -Hospital bed, RW and 3 and 1 ordered.  Lactic acidosis: Likely type B.  Elevated AST: CK within normal.  Bandemia: Likely from malignancy and demargination. -Monitor  Body mass index is 27.32 kg/m.   DVT prophylaxis:   apixaban (ELIQUIS) tablet 5 mg  Code Status: Full code Family Communication: Updated patient's niece at bedside Level of care: Med-Surg Status is: Inpatient Remains inpatient appropriate because: Intractable cancer pain   Final disposition: Likely home with [facility] and DME Consultants:  Radiation oncology Palliative medicine Interventional radiology  Sch Meds:  Scheduled Meds:  apixaban  5 mg Oral BID   celecoxib  200 mg Oral BID   [START ON 09/16/2021] dexamethasone (DECADRON) injection  8 mg Intravenous Q24H   famotidine  20 mg Oral QHS   fentaNYL  1 patch Transdermal Q72H   gabapentin  200 mg Oral TID   latanoprost  1 drop Both Eyes QHS   lidocaine  1 patch Transdermal Q24H   metoprolol tartrate  50 mg Oral BID   oxyCODONE  40 mg Oral Q12H   senna  1 tablet Oral QHS   sodium chloride flush  3 mL Intravenous Q12H   traZODone  100 mg Oral QHS   Continuous Infusions: PRN Meds:.acetaminophen **OR** acetaminophen, bisacodyl, HYDROmorphone (DILAUDID) injection, magnesium hydroxide, ondansetron **OR** ondansetron (ZOFRAN) IV, mouth rinse, oxyCODONE-acetaminophen, polyethylene glycol, senna-docusate  Antimicrobials: Anti-infectives (From admission, onward)    None        I have personally reviewed the following labs and images: CBC: Recent Labs  Lab 09/11/21 0409 09/14/21 0429  WBC 17.8* 14.5*  HGB 8.6* 9.4*  HCT 27.6* 30.8*  MCV 76.7* 76.4*  PLT 417* 436*   BMP &GFR Recent Labs  Lab 09/11/21 0409 09/14/21 0429  NA 140 139  K 4.8 4.6  CL 100 97*  CO2 28 27  GLUCOSE 95 119*   BUN 18 22*  CREATININE 0.57 0.64  CALCIUM 9.4 9.6  MG 2.3 2.3  PHOS 3.7 3.4   Estimated Creatinine Clearance: 88.7 mL/min (by C-G formula based on SCr of 0.64 mg/dL). Liver & Pancreas: Recent Labs  Lab 09/11/21 0409 09/14/21 0429  AST 61* 57*  ALT 35 31  ALKPHOS 481* 530*  BILITOT 0.8 0.7  PROT 6.7 7.6  ALBUMIN 2.5* 2.7*   No results for input(s): "LIPASE", "AMYLASE" in the last 168 hours. No results for input(s): "AMMONIA" in the last 168 hours. Diabetic: No results for input(s): "HGBA1C" in the last 72 hours. No results for input(s): "GLUCAP" in the last 168 hours. Cardiac Enzymes: Recent Labs  Lab 09/11/21 0409  CKTOTAL 41   No results for input(s): "PROBNP" in the last 8760 hours. Coagulation Profile: No results for input(s): "INR", "PROTIME" in the last 168 hours. Thyroid Function Tests: No results for input(s): "TSH", "T4TOTAL", "FREET4", "T3FREE", "THYROIDAB" in the last 72 hours.  Lipid Profile: No results for input(s): "CHOL", "HDL", "LDLCALC", "TRIG", "CHOLHDL", "LDLDIRECT" in the last 72 hours. Anemia Panel: No  results for input(s): "VITAMINB12", "FOLATE", "FERRITIN", "TIBC", "IRON", "RETICCTPCT" in the last 72 hours. Urine analysis:    Component Value Date/Time   COLORURINE YELLOW 02/14/2010 0600   APPEARANCEUR CLEAR 02/14/2010 0600   LABSPEC 1.028 02/14/2010 0600   PHURINE 5.5 02/14/2010 0600   GLUCOSEU NEGATIVE 02/14/2010 0600   HGBUR NEGATIVE 02/14/2010 0600   BILIRUBINUR LARGE (A) 02/14/2010 0600   KETONESUR NEGATIVE 02/14/2010 0600   PROTEINUR NEGATIVE 02/14/2010 0600   UROBILINOGEN 1.0 02/14/2010 0600   NITRITE NEGATIVE 02/14/2010 0600   LEUKOCYTESUR  02/14/2010 0600    NEGATIVE MICROSCOPIC NOT DONE ON URINES WITH NEGATIVE PROTEIN, BLOOD, LEUKOCYTES, NITRITE, OR GLUCOSE <1000 mg/dL.   Sepsis Labs: Invalid input(s): "PROCALCITONIN", "LACTICIDVEN"  Microbiology: No results found for this or any previous visit (from the past 240  hour(s)).  Radiology Studies: No results found.    Brinlyn Cena T. Wickliffe  If 7PM-7AM, please contact night-coverage [email or web address] 09/15/2021, 1:40 PM

## 2021-09-16 ENCOUNTER — Other Ambulatory Visit: Payer: Self-pay

## 2021-09-16 ENCOUNTER — Ambulatory Visit
Admit: 2021-09-16 | Discharge: 2021-09-16 | Disposition: A | Discharge status: Home / Self Care | Payer: BLUE CROSS/BLUE SHIELD | Attending: Radiation Oncology | Admitting: Radiation Oncology

## 2021-09-16 DIAGNOSIS — C7989 Secondary malignant neoplasm of other specified sites: Secondary | ICD-10-CM | POA: Diagnosis not present

## 2021-09-16 DIAGNOSIS — D638 Anemia in other chronic diseases classified elsewhere: Secondary | ICD-10-CM | POA: Diagnosis not present

## 2021-09-16 DIAGNOSIS — R000 Tachycardia, unspecified: Secondary | ICD-10-CM | POA: Diagnosis not present

## 2021-09-16 DIAGNOSIS — C801 Malignant (primary) neoplasm, unspecified: Secondary | ICD-10-CM | POA: Diagnosis not present

## 2021-09-16 DIAGNOSIS — G893 Neoplasm related pain (acute) (chronic): Secondary | ICD-10-CM | POA: Diagnosis not present

## 2021-09-16 DIAGNOSIS — R52 Pain, unspecified: Secondary | ICD-10-CM | POA: Diagnosis not present

## 2021-09-16 DIAGNOSIS — R0789 Other chest pain: Secondary | ICD-10-CM | POA: Diagnosis not present

## 2021-09-16 LAB — RAD ONC ARIA SESSION SUMMARY
Course Elapsed Days: 7
Plan Fractions Treated to Date: 4
Plan Prescribed Dose Per Fraction: 3 Gy
Plan Total Fractions Prescribed: 9
Plan Total Prescribed Dose: 27 Gy
Reference Point Dosage Given to Date: 15 Gy
Reference Point Session Dosage Given: 3 Gy
Session Number: 5

## 2021-09-16 MED ORDER — OXYCODONE-ACETAMINOPHEN 5-325 MG PO TABS
1.0000 | ORAL_TABLET | ORAL | Status: DC | PRN
  Administered 2021-09-16 – 2021-09-17 (×3): 2 via ORAL
  Filled 2021-09-16 (×3): qty 2

## 2021-09-16 NOTE — Progress Notes (Signed)
Patient saturation at rest 96% on RA.  While ambulating O2 sat 95% on RA.  Patient ambulated approx 300 feet.  No O2 needed at this time, patient tolerated well.

## 2021-09-16 NOTE — Progress Notes (Signed)
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  Patient was discharged on 2 L by [place] last hospitalization.  In ED, afebrile but tachycardic.  WBC 17.3 with left shift.  Hgb 9.6.  ALP elevated to 426.  CXR with stable right healer and suprahilar mass and a stable right basilar opacity.  Patient was started on IV pain medication and admitted for pain control.    Radiation oncology consulted, and patient started radiation treatment on 7/17.  Pain control remains barriers to discharge.  Oncology and palliative medicine consulted.   Patient underwent US-guided biopsy of LUE soft tissue mass on 7/21.  Pathology pending.  Palliative medicine following for pain management  Subjective: Seen and examined earlier this morning.  No major events overnight of this morning.  She says she had a good night last night.  Not in pain this morning.   Objective: Vitals:   09/16/21 0432 09/16/21 1201 09/16/21 1215 09/16/21 1358  BP: 106/67   105/72  Pulse: 94   86  Resp: 16   18  Temp: 97.8 F (36.6 C)   98 F (36.7 C)  TempSrc: Oral   Oral  SpO2: 100% 96% 94% 99%  Weight:      Height:        Examination:  GENERAL: No apparent distress.  Nontoxic. HEENT: MMM.  Vision and hearing grossly intact.  NECK: Supple.  No apparent JVD.  RESP:  No IWOB.  Fair aeration bilaterally. CVS:  RRR. Heart sounds normal.  ABD/GI/GU: BS+. Abd soft, NTND.  MSK/EXT:  Moves extremities.  Some swelling in left arm.   Trace BLE edema. SKIN: no apparent skin lesion or wound NEURO: Awake and alert. Oriented appropriately.  No apparent focal neuro deficit. PSYCH: Calm. Normal affect.   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  Patient had RUL lung wedge resection with clear margins in [place] in 2020.  Pathology at that time showed moderately differentiated adenocarcinoma of the lung.  CT angio chest showed large right suprahilar mass extending continuously into mediastinum and measuring about 6.3 cm maximal diameter with mets to liver, tail of pancreas, left anterior fifth ribs and multiple vertebral bodies as well as the left breast tissue.  -Started radiation on 7/17. -US guided biopsy of LUE soft tissue mass on 7/21.  Pathology pending -Oncology recommends MRI brain prior to discharge-ordered.  Cancer related pain: Still with significant pain -Appreciate help by palliative medicine-multimodal pain control -Aggressive bowel regimen -DME including hospital bed, walker and bedside commode ordered.   Left arm swelling: reported on  7/13.  CT left humerus on 7/14 showed 3.7 cm left infraspinatus muscle, 6.2 cm left biceps muscle and other multiple soft tissue masses along the left chest wall and within the left breast soft tissues consistent with metastatic disease  Recent WN:IOEVOJJKK PE diagnosed on 08/22/21.  She was discharged on 2 L by [place]. -Wean oxygen to room air. -Ambulatory saturation assessment -Continue Eliquis.  Sinus tachycardia: Likely due to pain and underlying PE.  Not  symptomatic.  Improved. -Continue metoprolol 50 mg twice daily  Anemia of chronic disease: Likely due to malignancy. Recent Labs    08/21/21 2137 08/22/21 0325 08/23/21 0149 08/24/21 0058 08/25/21 0033 09/08/2021 2149 09/04/21 0500 09/06/21 0423 09/11/21 0409 09/14/21 0429  HGB 10.5* 9.2* 9.6* 9.7* 10.0* 9.6* 8.9* 9.1* 8.6* 9.4*  -Monitor intermittently  Physical deconditioning -Therapy recommended home health.   -Hospital bed, RW and 3 and 1 ordered.  Lactic acidosis: Likely type B.  Elevated AST: CK within normal.  Bandemia: Likely from malignancy and demargination. -Monitor  Body mass index is 27.32 kg/m.   DVT prophylaxis:   apixaban (ELIQUIS) tablet 5 mg  Code Status: Full code Family Communication: Updated patient's niece at bedside Level of care: Med-Surg Status is: Inpatient Remains inpatient appropriate because: Intractable cancer pain   Final disposition: Likely home with HH and DME Consultants:  Radiation oncology Palliative medicine Interventional radiology  Sch Meds:  Scheduled Meds:  apixaban  5 mg Oral BID   celecoxib  200 mg Oral BID   dexamethasone (DECADRON) injection  8 mg Intravenous Q24H   famotidine  20 mg Oral QHS   fentaNYL  1 patch Transdermal Q72H   gabapentin  200 mg Oral TID   latanoprost  1 drop Both Eyes QHS   lidocaine  1 patch Transdermal Q24H   metoprolol tartrate  50 mg Oral BID   oxyCODONE  40 mg Oral Q12H   senna  1 tablet Oral QHS   sodium chloride flush  3 mL Intravenous Q12H   traZODone  100 mg Oral QHS   Continuous Infusions: PRN Meds:.acetaminophen **OR** acetaminophen, HYDROmorphone (DILAUDID) injection, LORazepam, magnesium hydroxide, ondansetron **OR** ondansetron (ZOFRAN) IV, mouth rinse, oxyCODONE-acetaminophen, polyethylene glycol, senna-docusate  Antimicrobials: Anti-infectives (From admission, onward)    None        I have personally reviewed the following labs and images: CBC: Recent Labs   Lab 09/11/21 0409 09/14/21 0429  WBC 17.8* 14.5*  HGB 8.6* 9.4*  HCT 27.6* 30.8*  MCV 76.7* 76.4*  PLT 417* 436*   BMP &GFR Recent Labs  Lab 09/11/21 0409 09/14/21 0429  NA 140 139  K 4.8 4.6  CL 100 97*  CO2 28 27  GLUCOSE 95 119*  BUN 18 22*  CREATININE 0.57 0.64  CALCIUM 9.4 9.6  MG 2.3 2.3  PHOS 3.7 3.4   Estimated Creatinine Clearance: 88.7 mL/min (by C-G formula based on SCr of 0.64 mg/dL). Liver & Pancreas: Recent Labs  Lab 09/11/21 0409 09/14/21 0429  AST 61* 57*  ALT 35 31  ALKPHOS 481* 530*  BILITOT 0.8 0.7  PROT 6.7 7.6  ALBUMIN 2.5* 2.7*   No results for input(s): "LIPASE", "AMYLASE" in the last 168 hours. No results for input(s): "AMMONIA" in the last 168 hours. Diabetic: No results for input(s): "HGBA1C" in the last 72 hours. No results for input(s): "GLUCAP" in the last 168 hours. Cardiac Enzymes: Recent Labs  Lab 09/11/21 0409  CKTOTAL 41   No results for input(s): "PROBNP" in the last  8760 hours. Coagulation Profile: No results for input(s): "INR", "PROTIME" in the last 168 hours. Thyroid Function Tests: No results for input(s): "TSH", "T4TOTAL", "FREET4", "T3FREE", "THYROIDAB" in the last 72 hours.  Lipid Profile: No results for input(s): "CHOL", "HDL", "LDLCALC", "TRIG", "CHOLHDL", "LDLDIRECT" in the last 72 hours. Anemia Panel: No results for input(s): "VITAMINB12", "FOLATE", "FERRITIN", "TIBC", "IRON", "RETICCTPCT" in the last 72 hours. Urine analysis:    Component Value Date/Time   COLORURINE YELLOW 02/14/2010 0600   APPEARANCEUR CLEAR 02/14/2010 0600   LABSPEC 1.028 02/14/2010 0600   PHURINE 5.5 02/14/2010 0600   GLUCOSEU NEGATIVE 02/14/2010 0600   HGBUR NEGATIVE 02/14/2010 0600   BILIRUBINUR LARGE (A) 02/14/2010 0600   KETONESUR NEGATIVE 02/14/2010 0600   PROTEINUR NEGATIVE 02/14/2010 0600   UROBILINOGEN 1.0 02/14/2010 0600   NITRITE NEGATIVE 02/14/2010 0600   LEUKOCYTESUR  02/14/2010 0600    NEGATIVE MICROSCOPIC NOT  DONE ON URINES WITH NEGATIVE PROTEIN, BLOOD, LEUKOCYTES, NITRITE, OR GLUCOSE <1000 mg/dL.   Sepsis Labs: Invalid input(s): "PROCALCITONIN", "LACTICIDVEN"  Microbiology: No results found for this or any previous visit (from the past 240 hour(s)).  Radiology Studies: No results found.    Anapaula Severt T. Latham  If 7PM-7AM, please contact night-coverage [email or web address] 09/16/2021, 3:56 PM

## 2021-09-17 ENCOUNTER — Ambulatory Visit
Admit: 2021-09-17 | Discharge: 2021-09-17 | Disposition: A | Discharge status: Home / Self Care | Payer: BLUE CROSS/BLUE SHIELD | Attending: Radiation Oncology | Admitting: Radiation Oncology

## 2021-09-17 ENCOUNTER — Other Ambulatory Visit: Payer: Self-pay

## 2021-09-17 ENCOUNTER — Inpatient Hospital Stay (HOSPITAL_COMMUNITY): Payer: BLUE CROSS/BLUE SHIELD

## 2021-09-17 ENCOUNTER — Encounter: Payer: BLUE CROSS/BLUE SHIELD | Admitting: Student

## 2021-09-17 DIAGNOSIS — R000 Tachycardia, unspecified: Secondary | ICD-10-CM | POA: Diagnosis not present

## 2021-09-17 DIAGNOSIS — G893 Neoplasm related pain (acute) (chronic): Secondary | ICD-10-CM | POA: Diagnosis not present

## 2021-09-17 DIAGNOSIS — C801 Malignant (primary) neoplasm, unspecified: Secondary | ICD-10-CM | POA: Diagnosis not present

## 2021-09-17 DIAGNOSIS — D638 Anemia in other chronic diseases classified elsewhere: Secondary | ICD-10-CM | POA: Diagnosis not present

## 2021-09-17 DIAGNOSIS — R0789 Other chest pain: Secondary | ICD-10-CM | POA: Diagnosis not present

## 2021-09-17 DIAGNOSIS — R52 Pain, unspecified: Secondary | ICD-10-CM | POA: Diagnosis not present

## 2021-09-17 DIAGNOSIS — C7989 Secondary malignant neoplasm of other specified sites: Secondary | ICD-10-CM | POA: Diagnosis not present

## 2021-09-17 LAB — RAD ONC ARIA SESSION SUMMARY
Course Elapsed Days: 8
Plan Fractions Treated to Date: 5
Plan Prescribed Dose Per Fraction: 3 Gy
Plan Total Fractions Prescribed: 9
Plan Total Prescribed Dose: 27 Gy
Reference Point Dosage Given to Date: 18 Gy
Reference Point Session Dosage Given: 3 Gy
Session Number: 6

## 2021-09-17 MED ORDER — HYDROMORPHONE HCL 2 MG PO TABS
2.0000 mg | ORAL_TABLET | ORAL | Status: DC | PRN
  Administered 2021-09-17 – 2021-09-21 (×20): 4 mg via ORAL
  Administered 2021-09-21: 2 mg via ORAL
  Administered 2021-09-21 – 2021-09-24 (×8): 4 mg via ORAL
  Administered 2021-09-25: 2 mg via ORAL
  Administered 2021-09-25 (×2): 4 mg via ORAL
  Filled 2021-09-17 (×33): qty 2

## 2021-09-17 MED ORDER — LORAZEPAM 2 MG/ML IJ SOLN
1.0000 mg | Freq: Once | INTRAMUSCULAR | Status: AC | PRN
Start: 2021-09-17 — End: 2021-09-17
  Administered 2021-09-17: 1 mg via INTRAVENOUS
  Filled 2021-09-17: qty 1

## 2021-09-17 MED ORDER — ZINC OXIDE 40 % EX OINT
TOPICAL_OINTMENT | Freq: Every day | CUTANEOUS | Status: DC
  Filled 2021-09-17: qty 57

## 2021-09-17 MED ORDER — FENTANYL 75 MCG/HR TD PT72
1.0000 | MEDICATED_PATCH | TRANSDERMAL | Status: DC
  Administered 2021-09-17 – 2021-09-20 (×2): 1 via TRANSDERMAL
  Filled 2021-09-17 (×2): qty 1

## 2021-09-17 MED ORDER — OXYCODONE HCL ER 20 MG PO T12A
20.0000 mg | EXTENDED_RELEASE_TABLET | Freq: Two times a day (BID) | ORAL | Status: DC
  Administered 2021-09-17 – 2021-09-22 (×10): 20 mg via ORAL
  Filled 2021-09-17 (×10): qty 1

## 2021-09-17 MED ORDER — GADOBUTROL 1 MMOL/ML IV SOLN
8.0000 mL | Freq: Once | INTRAVENOUS | Status: AC | PRN
  Administered 2021-09-17: 8 mL via INTRAVENOUS

## 2021-09-17 NOTE — Progress Notes (Signed)
Daily Progress Note   Patient Name: [name]       Date: 09/17/2021 DOB: 06-06-1965  Age: 56 y.o. MRN#: [id number] Attending Physician: Mercy Riding, MD Primary Care Physician: Pcp, No Admit Date: 09/07/2021  Reason for Consultation/Follow-up: Pain control  Subjective: Awake alert, sitting up in chair, sister and cousin in the room.  Patient states that pain is reasonably well controlled today, awaiting MRI.    Length of Stay: 13  Current Medications: Scheduled Meds:   apixaban  5 mg Oral BID   celecoxib  200 mg Oral BID   dexamethasone (DECADRON) injection  8 mg Intravenous Q24H   famotidine  20 mg Oral QHS   fentaNYL  1 patch Transdermal Q72H   gabapentin  200 mg Oral TID   latanoprost  1 drop Both Eyes QHS   lidocaine  1 patch Transdermal Q24H   liver oil-zinc oxide   Topical Daily   metoprolol tartrate  50 mg Oral BID   oxyCODONE  20 mg Oral Q12H   senna  1 tablet Oral QHS   sodium chloride flush  3 mL Intravenous Q12H   traZODone  100 mg Oral QHS    Continuous Infusions:   PRN Meds: acetaminophen **OR** acetaminophen, HYDROmorphone (DILAUDID) injection, HYDROmorphone, LORazepam, magnesium hydroxide, ondansetron **OR** ondansetron (ZOFRAN) IV, mouth rinse, polyethylene glycol, senna-docusate  Physical Exam         Awake alert Sitting up in chair No distress Regular work of breathing S 1 S 2 Abdomen not distended  Vital Signs: BP 107/72 (BP Location: Right Arm)   Pulse 84   Temp 98.2 F (36.8 C) (Oral)   Resp 12   Ht 5\' 9"  (1.753 m)   Wt 83.9 kg   SpO2 98%   BMI 27.32 kg/m  SpO2: SpO2: 98 % O2 Device: O2 Device: Room Air O2 Flow Rate: O2 Flow Rate (L/min): 2 L/min  Intake/output summary:  Intake/Output Summary (Last 24 hours) at 09/17/2021  1359 Last data filed at 09/17/2021 0803 Gross per 24 hour  Intake 478 ml  Output --  Net 478 ml   LBM: Last BM Date : 09/17/21 Baseline Weight: Weight: 83.9 kg Most recent weight: Weight: 83.9 kg       Palliative Assessment/Data:      Patient Active Problem List  Diagnosis Date Noted   Sinus tachycardia 09/12/2021   Anemia of chronic disease 09/11/2021   Bandemia 09/11/2021   Cancer related pain 09/10/2021   Lactic acidosis 09/10/2021   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[place]) 09/06/2021   Pain from bone metastases ([place]) 09/06/2021   Chest wall pain 09/04/2021   Metastatic adenocarcinoma ([place]) 09/04/2021   Intractable pain 09/04/2021   Pulmonary embolism ([place]) 08/22/2021   Acute pulmonary embolism, unspecified pulmonary embolism type, unspecified whether acute cor pulmonale present ([place]) 08/22/2021   Primary cancer of right upper lobe of lung ([place]) 07/08/2018   Urinary incontinence 04/05/2011    Palliative Care Assessment & Plan   Patient Profile:    Assessment:  56 year old F with PMH of 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  Patient was discharged on 2 L by [place] last hospitalization.    Radiation oncology consulted, and patient started radiation treatment on 7/17.  Pain control remains barriers to discharge.  Oncology and palliative medicine consulted.    Patient underwent US-guided biopsy of LUE soft tissue mass on 7/21.  Pathology pending.  Palliative medicine following for pain management  Recommendations/Plan: Pain management recommendations discussed with patient after medication history was reviewed:  1. Increase trans dermal Fentanyl from 50 mcg to 75 mcg Q 72 hours. Apply patch today after patient comes back from MRI.    2. Wean down the PO OxyContin from 40 mg BID to 20 mg PO BID in order to have the patient on only one long acting opioid, transdermal Fentanyl.   3. Opioid rotate from PO Percocet to PO Dilaudid 2 mg Q 3 hours for moderate pain and 4 mg PO Q 3 hours for severe pain. Use first and use PRN IV Dilaudid for breakthrough pain.  Continue other adjuvants and monitor. Patient states that she needs IV Dilaudid for breakthouh pain in order to tolerate her radiation treatments. Continue to monitor her pain regimen.   Goals of Care and Additional Recommendations: Limitations on Scope of Treatment: Full Scope Treatment  Code Status:    Code Status Orders  (From admission, onward)           Start     Ordered   09/04/21 0035  Full code  Continuous        09/04/21 0037           Code Status History     Date Active Date Inactive Code Status Order ID Comments User Context   08/22/2021 0743 08/27/2021 0129 Full Code [id number]  [name], MD Inpatient   08/22/2021 0309 08/22/2021 0743 Full Code [id number]  Howerter, Ethelda Chick, DO ED       Prognosis:  Unable to determine  Discharge Planning: To Be Determined  Care plan was discussed with  patient and family, also discussed with RN.   Thank you for allowing the Palliative Medicine Team to assist in the care of this patient.  MOD MDM.     Greater than 50%  of this time was spent counseling and coordinating care related to the above assessment and plan.  Loistine Chance, MD  Please contact Palliative Medicine Team phone at [phone] for questions and concerns.

## 2021-09-17 NOTE — Progress Notes (Signed)
Palliative Care Progress Note  [name] is much improved today. This is the best level of pain control she has had since admission. She is very encouraged and engaged today. Family at bedside are pleased to see her doing well.  She is benefiting from opioid dose adjustments, addition of steroid and NSAID and radiation.  Recommendations:  Leave her on current regimen for 24 hours before switching to an oral equivalent. Our team will follow up.  Can gradually reduce decadron over a few weeks while radiation starts to work on her pain.  We will see her in follow-up in [facility] after discharge.  Lane Hacker, DO Palliative Medicine

## 2021-09-17 NOTE — Progress Notes (Signed)
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  Patient was discharged on 2 L by [place] last hospitalization.  In ED, afebrile but tachycardic.  WBC 17.3 with left shift.  Hgb 9.6.  ALP elevated to 426.  CXR with stable right healer and suprahilar mass and a stable right basilar opacity.  Patient was started on IV pain medication and admitted for pain control.    Radiation oncology consulted, and patient started radiation treatment on 7/17.  Pain control remains barriers to discharge.  Oncology and palliative medicine consulted.   Patient underwent US-guided biopsy of LUE soft tissue mass on 7/21.  Pathology pending.  Palliative medicine following for pain management  Subjective: Seen and examined earlier this morning.  No major events overnight of this morning.  No complaints other than difficulty falling asleep.  Pain well controlled but is still not able to lie flat in bed  Objective: Vitals:   09/16/21 1358 09/16/21 2056 09/17/21 0414 09/17/21 0907  BP: 105/72 119/75 107/66 117/69  Pulse: 86 94 89 87  Resp: 18 20 20    Temp: 98 F (36.7 C) 98 F (36.7 C) 98.1 F (36.7 C)   TempSrc: Oral Oral Oral   SpO2: 99% 100% 95%   Weight:      Height:        Examination:  GENERAL: No apparent distress.  Nontoxic. HEENT: MMM.  Vision and hearing grossly intact.  NECK: Supple.  No apparent JVD.  RESP:  No IWOB.  Fair aeration bilaterally. CVS:  RRR. Heart sounds normal.  ABD/GI/GU:  BS+. Abd soft, NTND.  MSK/EXT:  Moves extremities. No apparent deformity.  Trace BLE edema. SKIN: no apparent skin lesion or wound NEURO: Awake and alert. Oriented appropriately.  No apparent focal neuro deficit. PSYCH: Calm. Normal affect.   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  Patient had RUL lung wedge resection with clear margins in [place] in 2020.  Pathology at that time showed moderately differentiated adenocarcinoma of the lung.  CT angio chest showed large right suprahilar mass extending continuously into mediastinum and measuring about 6.3 cm maximal diameter with mets to liver, tail of pancreas, left anterior fifth ribs and multiple vertebral bodies as well as the left breast tissue.  -Started radiation on 7/17. -US guided biopsy of LUE soft tissue mass on 7/21.  Pathology pending -Oncology recommends MRI brain prior to discharge-ordered.  Cancer related pain: Still with significant pain -Appreciate help by palliative medicine-multimodal pain control -Aggressive bowel regimen -DME including hospital bed, walker and bedside commode ordered.  Left arm swelling: reported on 7/13.  CT left humerus on 7/14 showed 3.7 cm left infraspinatus muscle, 6.2 cm left biceps muscle and other multiple soft tissue masses along the left chest wall and within the left breast soft tissues consistent with metastatic disease  Recent LK:[id number] PE diagnosed on 08/22/21.  She was discharged on 2 L by [place].  Now on room air. -Ambulatory saturation assessment -Continue  Eliquis.  Sinus tachycardia: Likely due to pain and underlying PE.  Resolved. -Continue metoprolol 50 mg twice daily  Anemia of chronic disease: Likely due to malignancy. Recent Labs    08/21/21 2137 08/22/21 0325 08/23/21 0149 08/24/21 0058 08/25/21 0033 08/29/2021 2149 09/04/21 0500 09/06/21 0423 09/11/21 0409 09/14/21 0429  HGB 10.5* 9.2* 9.6* 9.7* 10.0* 9.6* 8.9* 9.1* 8.6* 9.4*  -Monitor intermittently  Physical deconditioning -Therapy recommended home health.   -Hospital bed, RW and 3 and 1 ordered.  Lactic acidosis: Likely type B.  Elevated AST: CK within normal.  Bandemia: Likely from malignancy and demargination. -Monitor  Body mass index is 27.32 kg/m.   DVT prophylaxis:   apixaban (ELIQUIS) tablet 5 mg  Code Status: Full code Family Communication: None at bedside. Level of care: Med-Surg Status is: Inpatient Remains inpatient appropriate because: Intractable cancer pain   Final disposition: Likely home with HH and DME Consultants:  Radiation oncology Palliative medicine Interventional radiology  Sch Meds:  Scheduled Meds:  apixaban  5 mg Oral BID   celecoxib  200 mg Oral BID   dexamethasone (DECADRON) injection  8 mg Intravenous Q24H   famotidine  20 mg Oral QHS   fentaNYL  1 patch Transdermal Q72H   gabapentin  200 mg Oral TID   latanoprost  1 drop Both Eyes QHS   lidocaine  1 patch Transdermal Q24H   liver oil-zinc oxide   Topical Daily   metoprolol tartrate  50 mg Oral BID   oxyCODONE  40 mg Oral Q12H   senna  1 tablet Oral QHS   sodium chloride flush  3 mL Intravenous Q12H   traZODone  100 mg Oral QHS   Continuous Infusions: PRN Meds:.acetaminophen **OR** acetaminophen, HYDROmorphone (DILAUDID) injection, magnesium hydroxide, ondansetron **OR** ondansetron (ZOFRAN) IV, mouth rinse, oxyCODONE-acetaminophen, polyethylene glycol, senna-docusate  Antimicrobials: Anti-infectives (From admission, onward)    None        I have  personally reviewed the following labs and images: CBC: Recent Labs  Lab 09/11/21 0409 09/14/21 0429  WBC 17.8* 14.5*  HGB 8.6* 9.4*  HCT 27.6* 30.8*  MCV 76.7* 76.4*  PLT 417* 436*   BMP &GFR Recent Labs  Lab 09/11/21 0409 09/14/21 0429  NA 140 139  K 4.8 4.6  CL 100 97*  CO2 28 27  GLUCOSE 95 119*  BUN 18 22*  CREATININE 0.57 0.64  CALCIUM 9.4 9.6  MG 2.3 2.3  PHOS 3.7 3.4   Estimated Creatinine Clearance: 88.7 mL/min (by C-G formula based on SCr of 0.64 mg/dL). Liver & Pancreas: Recent Labs  Lab 09/11/21 0409 09/14/21 0429  AST 61* 57*  ALT 35 31  ALKPHOS 481* 530*  BILITOT 0.8 0.7  PROT 6.7 7.6  ALBUMIN 2.5* 2.7*   No results for input(s): "LIPASE", "AMYLASE" in the last 168 hours. No results for input(s): "AMMONIA" in the last 168 hours. Diabetic: No results for input(s): "HGBA1C" in the last 72 hours. No results for input(s): "GLUCAP" in the last 168 hours. Cardiac Enzymes: Recent Labs  Lab 09/11/21 0409  CKTOTAL 41  No results for input(s): "PROBNP" in the last 8760 hours. Coagulation Profile: No results for input(s): "INR", "PROTIME" in the last 168 hours. Thyroid Function Tests: No results for input(s): "TSH", "T4TOTAL", "FREET4", "T3FREE", "THYROIDAB" in the last 72 hours.  Lipid Profile: No results for input(s): "CHOL", "HDL", "LDLCALC", "TRIG", "CHOLHDL", "LDLDIRECT" in the last 72 hours. Anemia Panel: No results for input(s): "VITAMINB12", "FOLATE", "FERRITIN", "TIBC", "IRON", "RETICCTPCT" in the last 72 hours. Urine analysis:    Component Value Date/Time   COLORURINE YELLOW 02/14/2010 0600   APPEARANCEUR CLEAR 02/14/2010 0600   LABSPEC 1.028 02/14/2010 0600   PHURINE 5.5 02/14/2010 0600   GLUCOSEU NEGATIVE 02/14/2010 0600   HGBUR NEGATIVE 02/14/2010 0600   BILIRUBINUR LARGE (A) 02/14/2010 0600   KETONESUR NEGATIVE 02/14/2010 0600   PROTEINUR NEGATIVE 02/14/2010 0600   UROBILINOGEN 1.0 02/14/2010 0600   NITRITE NEGATIVE  02/14/2010 0600   LEUKOCYTESUR  02/14/2010 0600    NEGATIVE MICROSCOPIC NOT DONE ON URINES WITH NEGATIVE PROTEIN, BLOOD, LEUKOCYTES, NITRITE, OR GLUCOSE <1000 mg/dL.   Sepsis Labs: Invalid input(s): "PROCALCITONIN", "LACTICIDVEN"  Microbiology: No results found for this or any previous visit (from the past 240 hour(s)).  Radiology Studies: No results found.    Izola Teague T. Chili  If 7PM-7AM, please contact night-coverage [email or web address] 09/17/2021, 1:04 PM

## 2021-09-17 NOTE — Progress Notes (Signed)
   09/17/21 1549  Mobility  Activity Ambulated with assistance in hallway  Range of Motion/Exercises Active  Level of Assistance Minimal assist, patient does 75% or more  Assistive Device Front wheel walker  Distance Ambulated (ft) 350 ft  Activity Response Tolerated well  Transport method Ambulatory  $Mobility charge 1 Mobility   Pt was eager to walk upon entry. Pt timed herself to see how long it would take to walk around unit (354ft). Pt tolerated ambulation well. Pt left on recliner w/ SCDs attached, with necessities in reach & family in room.    [facility]

## 2021-09-17 NOTE — Plan of Care (Signed)

## 2021-09-17 NOTE — TOC Progression Note (Signed)
Transition of Care [facility]) - Progression Note    Patient Details  Name: [name] MRN: [id number] Date of Birth: 12-27-1965  Transition of Care [facility]) CM/SW Contact  Leeroy Cha, RN Phone Number: 09/17/2021, 7:38 AM  Clinical Narrative:    Unable to find hhc agency that will take commerical bcbs for hhc.   Expected Discharge Plan: Home/Self Care Barriers to Discharge: No [facility] will accept this patient  Expected Discharge Plan and Services Expected Discharge Plan: Home/Self Care   Discharge Planning Services: CM Consult   Living arrangements for the past 2 months: Single Family Home                                       Social Determinants of Health (SDOH) Interventions    Readmission Risk Interventions    09/10/2021    2:32 PM  Readmission Risk Prevention Plan  Transportation Screening Complete  PCP or Specialist Appt within 5-7 Days Complete  Home Care Screening Complete  Medication Review (RN CM) Complete

## 2021-09-18 ENCOUNTER — Encounter: Payer: Self-pay | Admitting: *Deleted

## 2021-09-18 ENCOUNTER — Ambulatory Visit: Payer: BLUE CROSS/BLUE SHIELD

## 2021-09-18 DIAGNOSIS — R0789 Other chest pain: Secondary | ICD-10-CM | POA: Diagnosis not present

## 2021-09-18 DIAGNOSIS — R000 Tachycardia, unspecified: Secondary | ICD-10-CM | POA: Diagnosis not present

## 2021-09-18 DIAGNOSIS — D638 Anemia in other chronic diseases classified elsewhere: Secondary | ICD-10-CM | POA: Diagnosis not present

## 2021-09-18 DIAGNOSIS — G893 Neoplasm related pain (acute) (chronic): Secondary | ICD-10-CM | POA: Diagnosis not present

## 2021-09-18 DIAGNOSIS — C801 Malignant (primary) neoplasm, unspecified: Secondary | ICD-10-CM | POA: Diagnosis not present

## 2021-09-18 DIAGNOSIS — C7931 Secondary malignant neoplasm of brain: Secondary | ICD-10-CM

## 2021-09-18 DIAGNOSIS — C7989 Secondary malignant neoplasm of other specified sites: Secondary | ICD-10-CM | POA: Diagnosis not present

## 2021-09-18 DIAGNOSIS — R52 Pain, unspecified: Secondary | ICD-10-CM | POA: Diagnosis not present

## 2021-09-18 LAB — SURGICAL PATHOLOGY

## 2021-09-18 NOTE — Progress Notes (Signed)
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  Patient was discharged on 2 L by [place] last hospitalization.  In ED, afebrile but tachycardic.  WBC 17.3 with left shift.  Hgb 9.6.  ALP elevated to 426.  CXR with stable right healer and suprahilar mass and a stable right basilar opacity.  Patient was started on IV pain medication and admitted for pain control.    Radiation oncology consulted, and patient started radiation treatment on 7/17.  Pain control remains barriers to discharge.  Oncology and palliative medicine consulted.   Patient underwent US-guided biopsy of LUE soft tissue mass on 7/21.  Pathology showed adenocarcinoma.  MRI brain concerning for brain mets and extracranial soft tissue mets.  Palliative medicine following for pain management  Subjective: Seen and examined earlier this morning.  No major events overnight of this morning.  Continues to endorse significant pain in his shoulders and back.  She has numerous ice packs on her shoulders.  She is understandably sad with the news of brain mets.  Objective: Vitals:   09/17/21 0907 09/17/21 1320 09/17/21 2025 09/18/21 0449  BP: 117/69 107/72 122/71 116/71  Pulse: 87 84 100 85  Resp:  12 20 18   Temp:  98.2 F (36.8 C) 98.4 F (36.9 C) 97.7 F (36.5 C)  TempSrc:  Oral Oral Oral  SpO2:  98% 100% 99%  Weight:      Height:        Examination:  GENERAL: No apparent distress.  Nontoxic. HEENT: MMM.  Vision and hearing  grossly intact.  NECK: Supple.  No apparent JVD.  RESP:  No IWOB.  Fair aeration bilaterally. CVS:  RRR. Heart sounds normal.  ABD/GI/GU: BS+. Abd soft, NTND.  MSK/EXT:  Moves extremities. No apparent deformity.  Trace BLE edema. SKIN: no apparent skin lesion or wound NEURO: Awake and alert. Oriented appropriately.  No apparent focal neuro deficit. PSYCH: Calm. Normal affect.   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  Pathology at that time showed moderately differentiated adenocarcinoma of the lung.  CT angio chest showed large right suprahilar mass extending continuously into mediastinum and measuring about 6.3 cm maximal diameter with mets to brain, liver, tail of pancreas, left anterior fifth ribs and multiple vertebral bodies as well as the left breast tissue.  -Started radiation to the spine and ribs on 7/17>> -US guided biopsy of LUE soft tissue mass on 7/21.  Pathology shows adenocarcinoma -Oncology recommends MRI brain which is concerning for brain  mets. -Follow further recommendation by oncology and radiation oncology -Appreciate help by palliative medicine with pain management  Cancer related pain: Still with significant pain requiring IV Dilaudid especially with radiation and at night -Appreciate help by palliative medicine-adjusting pain meds -Aggressive bowel regimen -DME including hospital bed, walker and bedside commode ordered.   Left arm swelling: reported on 7/13.  CT left humerus on  7/14 showed 3.7 cm left infraspinatus muscle, 6.2 cm left biceps muscle and other multiple soft tissue masses along the left chest wall and within the left breast soft tissues consistent with metastatic disease  Recent XF:[id number] PE diagnosed on 08/22/21.  She was discharged on 2 L by [facility].  Now on room air. -Ambulatory saturation assessment -Continue Eliquis.  Sinus tachycardia: Likely due to pain and underlying PE.  Resolved. -Continue metoprolol 50 mg twice daily  Anemia of chronic disease: Likely due to malignancy. Recent Labs    08/21/21 2137 08/22/21 0325 08/23/21 0149 08/24/21 0058 08/25/21 0033 09/16/2021 2149 09/04/21 0500 09/06/21 0423 09/11/21 0409 09/14/21 0429  HGB 10.5* 9.2* 9.6* 9.7* 10.0* 9.6* 8.9* 9.1* 8.6* 9.4*  -Monitor intermittently  Physical deconditioning -Therapy recommended home health.   -Hospital bed, RW and 3 and 1 ordered.  Lactic acidosis: Likely type B.  Elevated AST: CK within normal.  Bandemia: Likely from malignancy and demargination. -Monitor  Body mass index is 27.32 kg/m.   DVT prophylaxis:   apixaban (ELIQUIS) tablet 5 mg  Code Status: Full code Family Communication: None at bedside. Level of care: Med-Surg Status is: Inpatient Remains inpatient appropriate because: Intractable cancer pain   Final disposition: Likely home with HH and DME Consultants:  Radiation oncology Palliative medicine Interventional radiology  Sch Meds:  Scheduled Meds:  apixaban  5 mg Oral BID   celecoxib  200 mg Oral BID   dexamethasone (DECADRON) injection  8 mg Intravenous Q24H   famotidine  20 mg Oral QHS   fentaNYL  1 patch Transdermal Q72H   gabapentin  200 mg Oral TID   latanoprost  1 drop Both Eyes QHS   lidocaine  1 patch Transdermal Q24H   liver oil-zinc oxide   Topical Daily   metoprolol tartrate  50 mg Oral BID   oxyCODONE  20 mg Oral Q12H   senna  1 tablet Oral QHS   sodium chloride flush  3 mL Intravenous Q12H   traZODone   100 mg Oral QHS   Continuous Infusions: PRN Meds:.acetaminophen **OR** acetaminophen, HYDROmorphone (DILAUDID) injection, HYDROmorphone, magnesium hydroxide, ondansetron **OR** ondansetron (ZOFRAN) IV, mouth rinse, polyethylene glycol, senna-docusate  Antimicrobials: Anti-infectives (From admission, onward)    None        I have personally reviewed the following labs and images: CBC: Recent Labs  Lab 09/14/21 0429  WBC 14.5*  HGB 9.4*  HCT 30.8*  MCV 76.4*  PLT 436*   BMP &GFR Recent Labs  Lab 09/14/21 0429  NA 139  K 4.6  CL 97*  CO2 27  GLUCOSE 119*  BUN 22*  CREATININE 0.64  CALCIUM 9.6  MG 2.3  PHOS 3.4   Estimated Creatinine Clearance: 88.7 mL/min (by C-G formula based on SCr of 0.64 mg/dL). Liver & Pancreas: Recent Labs  Lab 09/14/21 0429  AST 57*  ALT 31  ALKPHOS 530*  BILITOT 0.7  PROT 7.6  ALBUMIN 2.7*   No results for input(s): "LIPASE", "AMYLASE" in the last 168 hours. No results for input(s): "AMMONIA" in the last 168 hours. Diabetic: No results for input(s): "HGBA1C"  in the last 72 hours. No results for input(s): "GLUCAP" in the last 168 hours. Cardiac Enzymes: No results for input(s): "CKTOTAL", "CKMB", "CKMBINDEX", "TROPONINI" in the last 168 hours.  No results for input(s): "PROBNP" in the last 8760 hours. Coagulation Profile: No results for input(s): "INR", "PROTIME" in the last 168 hours. Thyroid Function Tests: No results for input(s): "TSH", "T4TOTAL", "FREET4", "T3FREE", "THYROIDAB" in the last 72 hours.  Lipid Profile: No results for input(s): "CHOL", "HDL", "LDLCALC", "TRIG", "CHOLHDL", "LDLDIRECT" in the last 72 hours. Anemia Panel: No results for input(s): "VITAMINB12", "FOLATE", "FERRITIN", "TIBC", "IRON", "RETICCTPCT" in the last 72 hours. Urine analysis:    Component Value Date/Time   COLORURINE YELLOW 02/14/2010 0600   APPEARANCEUR CLEAR 02/14/2010 0600   LABSPEC 1.028 02/14/2010 0600   PHURINE 5.5 02/14/2010  0600   GLUCOSEU NEGATIVE 02/14/2010 0600   HGBUR NEGATIVE 02/14/2010 0600   BILIRUBINUR LARGE (A) 02/14/2010 0600   KETONESUR NEGATIVE 02/14/2010 0600   PROTEINUR NEGATIVE 02/14/2010 0600   UROBILINOGEN 1.0 02/14/2010 0600   NITRITE NEGATIVE 02/14/2010 0600   LEUKOCYTESUR  02/14/2010 0600    NEGATIVE MICROSCOPIC NOT DONE ON URINES WITH NEGATIVE PROTEIN, BLOOD, LEUKOCYTES, NITRITE, OR GLUCOSE <1000 mg/dL.   Sepsis Labs: Invalid input(s): "PROCALCITONIN", "LACTICIDVEN"  Microbiology: No results found for this or any previous visit (from the past 240 hour(s)).  Radiology Studies: MR BRAIN W WO CONTRAST  Result Date: 09/17/2021 CLINICAL DATA:  Evaluation for brain mets EXAM: MRI HEAD WITHOUT AND WITH CONTRAST TECHNIQUE: Multiplanar, multiecho pulse sequences of the brain and surrounding structures were obtained without and with intravenous contrast. CONTRAST:  77mL GADAVIST GADOBUTROL 1 MMOL/ML IV SOLN COMPARISON:  None Available. FINDINGS: Brain: There are many enhancing lesions, compatible with intracranial metastasis. Index lesions are annotated on series 20 and include: - 2.9 x 0.9 cm lesion along the posterior aspect of the right cerebellum with extension into the adjacent extra-axial space and probable invasion of the adjacent transverse sinus, which appears narrowed but patent (image 25) . -1 cm lesion left cerebellum (image 25). -6 mm lesion right cerebellum (image 19). -9 mm pineal lesion (image 65, possibly a metastasis given other metastases. -4 mm right parietal lesion (image 102). -4 mm posterior right frontal lesion (image 102). -Small right precentral gyrus lesion (image 108). -9 mm right frontal lesion (image 112). -1.5 cm right frontal lesion with extra-axial extension involvement of the overlying dura (image 121). -Small left frontal lesion (image 118). -Small left parietal lesion (image 120). -Small left precentral gyrus lesion (image 101). -Punctate right temporal lesion (image  58). -Small left parietal lesion (image 93) - Numerous additional small hemorrhagic lesions, most conspicuous on susceptibility weighted imaging and too numerous to count. The left cerebellar metastasis has signal characteristics compatible with recent hemorrhage. Edema surrounding many these lesions with out significant mass effect. No midline shift. No hydrocephalus or acute infarct. Vascular: Major arterial flow voids are maintained at the skull base. Skull and upper cervical spine: Peripherally enhancing 2.5 cm lesion in the subcutaneous soft tissues along in the posterior upper neck. Sinuses/Orbits: Clear sinuses.  No acute orbital findings. Other: No mastoid effusions. IMPRESSION: 1. Numerous intraparenchymal hemorrhagic metastases with index lesions detailed above. This includes plaque-like 2.9 cm lesion in the posterior right cerebellum with overlying extra-axial extension into the adjacent transverse sinus as well as 1.5 cm lesion in the right frontal lobe with overlying extra-axial extension into the adjacent dura. 2. Peripherally enhancing 2.5 cm lesion in the subcutaneous soft tissues along in  the posterior upper neck, suspicious for extracranial soft tissue metastasis. Complex cyst is a consideration (infection not excluded). Electronically Signed   By: Margaretha Sheffield M.D.   On: 09/17/2021 16:24      Ana Woodroof T. [place]  If 7PM-7AM, please contact night-coverage [email or web address] 09/18/2021, 11:57 AM

## 2021-09-18 NOTE — Progress Notes (Signed)
Daily Progress Note   Patient Name: [name]       Date: 09/18/2021 DOB: 1965-03-22  Age: 56 y.o. MRN#: [id number] Attending Physician: Mercy Riding, MD Primary Care Physician: Pcp, No Admit Date: 09/04/2021  Reason for Consultation/Follow-up: Pain control  Subjective: Awake alert, sitting up in chair, RN at bedside.  Patient complains of pain in the neck and upper back as well as generalized diffuse pain.  She is aware of MRI brain results are showing metastatic burden.    Length of Stay: 14  Current Medications: Scheduled Meds:   apixaban  5 mg Oral BID   celecoxib  200 mg Oral BID   dexamethasone (DECADRON) injection  8 mg Intravenous Q24H   famotidine  20 mg Oral QHS   fentaNYL  1 patch Transdermal Q72H   gabapentin  200 mg Oral TID   latanoprost  1 drop Both Eyes QHS   lidocaine  1 patch Transdermal Q24H   liver oil-zinc oxide   Topical Daily   metoprolol tartrate  50 mg Oral BID   oxyCODONE  20 mg Oral Q12H   senna  1 tablet Oral QHS   sodium chloride flush  3 mL Intravenous Q12H   traZODone  100 mg Oral QHS    Continuous Infusions:   PRN Meds: acetaminophen **OR** acetaminophen, HYDROmorphone (DILAUDID) injection, HYDROmorphone, magnesium hydroxide, ondansetron **OR** ondansetron (ZOFRAN) IV, mouth rinse, polyethylene glycol, senna-docusate  Physical Exam         Awake alert Sitting up in chair No distress Regular work of breathing S 1 S 2 Abdomen not distended  Vital Signs: BP 109/74 (BP Location: Right Arm)   Pulse 84   Temp 98.1 F (36.7 C) (Oral)   Resp 18   Ht 5\' 9"  (1.753 m)   Wt 83.9 kg   SpO2 100%   BMI 27.32 kg/m  SpO2: SpO2: 100 % O2 Device: O2 Device: Room Air O2 Flow Rate: O2 Flow Rate (L/min): 2 L/min  Intake/output summary:   Intake/Output Summary (Last 24 hours) at 09/18/2021 1319 Last data filed at 09/18/2021 0320 Gross per 24 hour  Intake 240 ml  Output --  Net 240 ml    LBM: Last BM Date : 09/17/21 Baseline Weight: Weight: 83.9 kg Most recent weight: Weight: 83.9 kg  Palliative Assessment/Data:      Patient Active Problem List   Diagnosis Date Noted   Metastasis to brain ([place]) 09/18/2021   Sinus tachycardia 09/12/2021   Anemia of chronic disease 09/11/2021   Bandemia 09/11/2021   Cancer related pain 09/10/2021   Lactic acidosis 09/10/2021   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[place]) 09/06/2021   Pain from bone metastases ([place]) 09/06/2021   Chest wall pain 09/04/2021   Metastatic adenocarcinoma ([place]) 09/04/2021   Intractable pain 09/04/2021   Pulmonary embolism ([place]) 08/22/2021   Acute pulmonary embolism, unspecified pulmonary embolism type, unspecified whether acute cor pulmonale present ([place]) 08/22/2021   Primary cancer of right upper lobe of lung ([place]) 07/08/2018   Urinary incontinence 04/05/2011    Palliative Care Assessment & Plan   Patient Profile:    Assessment:  56 year old F with PMH of 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  Patient was discharged on 2 L by [place] last hospitalization.    Radiation oncology consulted, and patient started radiation treatment on 7/17.  Pain control remains barriers to discharge.  Oncology and palliative medicine consulted.    Patient underwent US-guided biopsy of LUE soft tissue mass on 7/21.  Pathology pending.  Palliative medicine following for pain management  Recommendations/Plan: Pain management recommendations discussed with patient after medication history was reviewed:  1. trans dermal Fentanyl  75 mcg Q 72 hours.   2. Weaning down the PO OxyContin, continue  20 mg PO BID in order to have the patient on only one long acting opioid, transdermal Fentanyl.   3.  Continue PO Dilaudid 2 mg Q 3 hours for moderate pain and 4 mg PO Q 3 hours for severe pain. Use first and use PRN IV Dilaudid for breakthrough pain.  Continue other adjuvants and monitor. Patient states that she needs IV Dilaudid for breakthouh pain in order to tolerate her radiation treatments. Continue to monitor her pain regimen.  Discussed with patient and appreciate bedside nursing support.  Patient has oral Dilaudid available every 3 hours on an as-needed basis.  She will need to ask for and or be offered this on an as-needed basis so as to have a more regulated pain management so that we can continue to wean down the oral OxyContin and continue her on transdermal fentanyl and also avoid IV Dilaudid use as and when it is feasible.  Goals of Care and Additional Recommendations: Limitations on Scope of Treatment: Full Scope Treatment  Code Status:    Code Status Orders  (From admission, onward)           Start     Ordered   09/04/21 0035  Full code  Continuous        09/04/21 0037           Code Status History     Date Active Date Inactive Code Status Order ID Comments User Context   08/22/2021 0743 08/27/2021 0129 Full Code [id number]  [name], MD Inpatient   08/22/2021 0309 08/22/2021 0743 Full Code [id number]  Howerter, Ethelda Chick, DO ED       Prognosis:  Unable to determine  Discharge Planning: To Be Determined  Care plan was discussed with  patient  RN.   Thank you for allowing the Palliative Medicine Team to  assist in the care of this patient.  MOD MDM.     Greater than 50%  of this time was spent counseling and coordinating care related to the above assessment and plan.  Loistine Chance, MD  Please contact Palliative Medicine Team phone at [phone] for questions and concerns.

## 2021-09-18 NOTE — Progress Notes (Signed)
Notified [facility] in Pathology of request to send left bicep biopsy for molecular studies with [facility] and also for PDL-1 testing per request of Dr. Julien Nordmann.

## 2021-09-18 NOTE — Progress Notes (Signed)
Pt is scheduled for radiation and transporter arrived to pick her up but pt still in a lot of pain. Pt was already pre medicated with Diluduid IV. Pt stated that she's not ready to go to radiation right now.

## 2021-09-18 NOTE — Progress Notes (Signed)
Subjective: The patient is seen and examined today.  Her 2 sisters were at the bedside.  She is feeling a little bit better today and her pain is much better controlled.  She denied having any current chest pain but has shortness of breath with mild cough and no hemoptysis.  She has no nausea, vomiting, diarrhea or constipation.  Her ultrasound-guided core biopsy of the biceps soft tissue mass was consistent with adenocarcinoma but the immunohistochemical stains malignancy including lung, upper GI, pancreatobiliary or ovarian.  She also had MRI of the brain performed recently that showed multiple brain metastasis.  Objective: Vital signs in last 24 hours: Temp:  [97.7 F (36.5 C)-98.6 F (37 C)] 98.6 F (37 C) (07/26 2045) Pulse Rate:  [84-97] 97 (07/26 2045) Resp:  [18-20] 20 (07/26 2045) BP: (109-120)/(71-81) 120/81 (07/26 2045) SpO2:  [99 %-100 %] 100 % (07/26 2045)  Intake/Output from previous day: 07/25 0701 - 07/26 0700 In: 358 [P.O.:358] Out: -  Intake/Output this shift: No intake/output data recorded.  General appearance: alert, cooperative, fatigued, and no distress Resp: clear to auscultation bilaterally Cardio: regular rate and rhythm, S1, S2 normal, no murmur, click, rub or gallop GI: soft, non-tender; bowel sounds normal; no masses,  no organomegaly Extremities: extremities normal, atraumatic, no cyanosis or edema  Lab Results:  No results for input(s): "WBC", "HGB", "HCT", "PLT" in the last 72 hours. BMET No results for input(s): "NA", "K", "CL", "CO2", "GLUCOSE", "BUN", "CREATININE", "CALCIUM" in the last 72 hours.  Studies/Results: MR BRAIN W WO CONTRAST  Result Date: 09/17/2021 CLINICAL DATA:  Evaluation for brain mets EXAM: MRI HEAD WITHOUT AND WITH CONTRAST TECHNIQUE: Multiplanar, multiecho pulse sequences of the brain and surrounding structures were obtained without and with intravenous contrast. CONTRAST:  58m GADAVIST GADOBUTROL 1 MMOL/ML IV SOLN COMPARISON:   None Available. FINDINGS: Brain: There are many enhancing lesions, compatible with intracranial metastasis. Index lesions are annotated on series 20 and include: - 2.9 x 0.9 cm lesion along the posterior aspect of the right cerebellum with extension into the adjacent extra-axial space and probable invasion of the adjacent transverse sinus, which appears narrowed but patent (image 25) . -1 cm lesion left cerebellum (image 25). -6 mm lesion right cerebellum (image 19). -9 mm pineal lesion (image 65, possibly a metastasis given other metastases. -4 mm right parietal lesion (image 102). -4 mm posterior right frontal lesion (image 102). -Small right precentral gyrus lesion (image 108). -9 mm right frontal lesion (image 112). -1.5 cm right frontal lesion with extra-axial extension involvement of the overlying dura (image 121). -Small left frontal lesion (image 118). -Small left parietal lesion (image 120). -Small left precentral gyrus lesion (image 101). -Punctate right temporal lesion (image 58). -Small left parietal lesion (image 93) - Numerous additional small hemorrhagic lesions, most conspicuous on susceptibility weighted imaging and too numerous to count. The left cerebellar metastasis has signal characteristics compatible with recent hemorrhage. Edema surrounding many these lesions with out significant mass effect. No midline shift. No hydrocephalus or acute infarct. Vascular: Major arterial flow voids are maintained at the skull base. Skull and upper cervical spine: Peripherally enhancing 2.5 cm lesion in the subcutaneous soft tissues along in the posterior upper neck. Sinuses/Orbits: Clear sinuses.  No acute orbital findings. Other: No mastoid effusions. IMPRESSION: 1. Numerous intraparenchymal hemorrhagic metastases with index lesions detailed above. This includes plaque-like 2.9 cm lesion in the posterior right cerebellum with overlying extra-axial extension into the adjacent transverse sinus as well as 1.5 cm  lesion  in the right frontal lobe with overlying extra-axial extension into the adjacent dura. 2. Peripherally enhancing 2.5 cm lesion in the subcutaneous soft tissues along in the posterior upper neck, suspicious for extracranial soft tissue metastasis. Complex cyst is a consideration (infection not excluded). Electronically Signed   By: Margaretha Sheffield M.D.   On: 09/17/2021 16:24    Medications: I have reviewed the patient's current medications.   Assessment/Plan: This is a very pleasant 56 years old African-American female with history of stage Ib (T2 a, N0, M0) non-small cell lung cancer, adenocarcinoma with positive EGFR mutation diagnosed in May 2020 status post right upper lobectomy with lymph node dissection followed by adjuvant treatment with osimertinib for around 3 years. The patient is currently presenting with very aggressive metastatic disease of unclear etiology probably lung but other etiology cannot be completely excluded including upper gastrointestinal, pancreatic or even breast diagnosed and May 2023 but the tissue biopsy was not sufficient to identify the primary etiology. She has significant metastasis including the lung, mediastinum, liver, bone, breast, pancreas as well as kidney, soft tissue and muscular lesions. The patient has ultrasound-guided core biopsy of the left biceps soft tissue mass and again it was consistent with adenocarcinoma of suspicious gastrointestinal origin but lung, pancreatobiliary or ovarian could not be excluded. The patient had a guardant test by her oncologist in [place] and that showed the positive EGFR mutation but no other actionable mutations. I requested the tissue biopsy to be sent to [facility] for molecular studies and PD-L1 expression. I would strongly recommend evaluation by gastroenterology to rule out any gastrointestinal primary in this patient. For the new brain metastasis, I will ask Dr. [name] to see the patient for consideration  of palliative radiotherapy to the multiple brain metastasis. If he decided to proceed with whole brain irradiation, I will hold on starting any systemic chemotherapy until completion of the whole brain radiation and hopefully the molecular studies will be available by that time. For pain management the patient will continue with the current pain medication. For the history of pulmonary embolism, she is currently on treatment with Eliquis. Thank you for your continued good care of Ms. Walbert, I will continue to follow-up the patient with you and assist in her management. Disclaimer: This note was dictated with voice recognition software. Similar sounding words can inadvertently be transcribed and may be missed upon review. [name], MD    LOS: 14 days    [name] 09/18/2021

## 2021-09-19 ENCOUNTER — Other Ambulatory Visit: Payer: Self-pay

## 2021-09-19 ENCOUNTER — Ambulatory Visit
Admit: 2021-09-19 | Discharge: 2021-09-19 | Disposition: A | Discharge status: Home / Self Care | Payer: BLUE CROSS/BLUE SHIELD | Attending: Radiation Oncology | Admitting: Radiation Oncology

## 2021-09-19 DIAGNOSIS — R000 Tachycardia, unspecified: Secondary | ICD-10-CM | POA: Diagnosis not present

## 2021-09-19 DIAGNOSIS — D72825 Bandemia: Secondary | ICD-10-CM

## 2021-09-19 DIAGNOSIS — R52 Pain, unspecified: Secondary | ICD-10-CM

## 2021-09-19 DIAGNOSIS — D638 Anemia in other chronic diseases classified elsewhere: Secondary | ICD-10-CM

## 2021-09-19 DIAGNOSIS — R0789 Other chest pain: Secondary | ICD-10-CM | POA: Diagnosis not present

## 2021-09-19 DIAGNOSIS — G893 Neoplasm related pain (acute) (chronic): Secondary | ICD-10-CM | POA: Diagnosis not present

## 2021-09-19 DIAGNOSIS — D630 Anemia in neoplastic disease: Secondary | ICD-10-CM

## 2021-09-19 DIAGNOSIS — I2699 Other pulmonary embolism without acute cor pulmonale: Secondary | ICD-10-CM

## 2021-09-19 DIAGNOSIS — E872 Acidosis, unspecified: Secondary | ICD-10-CM

## 2021-09-19 DIAGNOSIS — H6120 Impacted cerumen, unspecified ear: Secondary | ICD-10-CM

## 2021-09-19 LAB — RAD ONC ARIA SESSION SUMMARY
Course Elapsed Days: 10
Plan Fractions Treated to Date: 6
Plan Fractions Treated to Date: 7
Plan Prescribed Dose Per Fraction: 3 Gy
Plan Prescribed Dose Per Fraction: 3 Gy
Plan Total Fractions Prescribed: 10
Plan Total Fractions Prescribed: 9
Plan Total Prescribed Dose: 27 Gy
Plan Total Prescribed Dose: 30 Gy
Reference Point Dosage Given to Date: 21 Gy
Reference Point Dosage Given to Date: 21 Gy
Reference Point Session Dosage Given: 3 Gy
Reference Point Session Dosage Given: 3 Gy
Session Number: 7

## 2021-09-19 MED ORDER — CARBAMIDE PEROXIDE 6.5 % OT SOLN
5.0000 [drp] | Freq: Two times a day (BID) | OTIC | Status: AC
  Administered 2021-09-19 – 2021-09-21 (×6): 5 [drp] via OTIC
  Filled 2021-09-19: qty 15

## 2021-09-19 MED ORDER — IOHEXOL 9 MG/ML PO SOLN
ORAL | Status: AC
  Filled 2021-09-19: qty 1000

## 2021-09-19 MED ORDER — IOHEXOL 9 MG/ML PO SOLN: 500.0000 mL | ORAL | Status: AC

## 2021-09-19 NOTE — TOC Progression Note (Signed)
Transition of Care [facility]) - Progression Note    Patient Details  Name: [name] MRN: [id number] Date of Birth: [birth year]  Transition of Care [facility]) CM/SW Contact  Leeroy Cha, RN Phone Number: 09/19/2021, 8:07 AM  Clinical Narrative:     [phone] chart reviewed.  Following for toc needs.  Unable to find hhc due to insurance.  No agency will accept.  Dme ordered through [place].  Expected Discharge Plan: Home/Self Care Barriers to Discharge: No [facility] will accept this patient  Expected Discharge Plan and Services Expected Discharge Plan: Home/Self Care   Discharge Planning Services: CM Consult   Living arrangements for the past 2 months: Single Family Home                                       Social Determinants of Health (SDOH) Interventions    Readmission Risk Interventions    09/10/2021    2:32 PM  Readmission Risk Prevention Plan  Transportation Screening Complete  PCP or Specialist Appt within 5-7 Days Complete  Home Care Screening Complete  Medication Review (RN CM) Complete

## 2021-09-19 NOTE — Progress Notes (Signed)
Chaplain engaged in an initial visit with [name]. Chaplain responded to referral from [place] Team.  [name] voiced that she was doing well today and not in any pain.  Chaplain let her know about Spiritual Care support services and offered support.     09/19/21 1100  Clinical Encounter Type  Visited With Patient  Visit Type Initial;Spiritual support

## 2021-09-19 NOTE — Progress Notes (Signed)
PROGRESS NOTE  [name] GEZ:[id number] DOB: 1965/08/01   PCP: Pcp, No  Patient is from: Home  DOA: 09/08/2021 LOS: 16  Chief complaints Chief Complaint  Patient presents with   Chest Pain     Brief Narrative / Interim history: 56 year old F with PMH of stage Ib non-small lung cancer s/p RUL lobectomy in 2020 followed by adjuvant treatment, recent diagnosis of widespread metastatic adenocarcinoma with unclear primary, recent hospitalization from 6/28-7/3 for acute respiratory failure with hypoxia in the setting of acute PE and and cancer related pain returning with cancer related pain that was not controlled with home regimen, and admitted for the same.  Patient was discharged on 2 L by [place] last hospitalization.  In ED, afebrile but tachycardic.  WBC 17.3 with left shift.  Hgb 9.6.  ALP elevated to 426.  CXR with stable right healer and suprahilar mass and a stable right basilar opacity.  Patient was started on IV pain medication and admitted for pain control.    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  Pain control remains barriers to discharge.  Oncology and palliative medicine consulted.   Patient underwent US-guided biopsy of LUE soft tissue mass on 7/21.  Pathology showed adenocarcinoma of unclear primary.  MRI brain concerning for brain mets and extracranial soft tissue mets.  [facility] consulted per recommendation by oncology.  CT abdomen and pelvis ordered.  Palliative medicine following for pain management  Subjective: Seen and examined earlier this morning.  No major events overnight of this morning.  Currently not in significant pain but she continues to require IV Dilaudid for breakthrough pain and radiation treatment.  She reports stuffy ear on the left.  Denies ear pain or diminished hearing.  Objective: Vitals:   09/18/21 0449 09/18/21 1236 09/18/21 2045 09/19/21 0505  BP: 116/71 109/74 120/81 116/81   Pulse: 85 84 97 91  Resp: 18 18 20 20   Temp: 97.7 F (36.5 C) 98.1 F (36.7 C) 98.6 F (37 C) 98.3 F (36.8 C)  TempSrc: Oral Oral Oral Oral  SpO2: 99% 100% 100% 100%  Weight:      Height:        Examination:  GENERAL: No apparent distress.  Nontoxic. HEENT: MMM.  Vision and hearing grossly intact.  Otoscopic exam with earwax in left ear. NECK: Supple.  No apparent JVD.  RESP:  No IWOB.  Fair aeration bilaterally. CVS:  RRR. Heart sounds normal.  ABD/GI/GU: BS+. Abd soft, NTND.  MSK/EXT:  Moves extremities. No apparent deformity. No edema.  SKIN: no apparent skin lesion or wound NEURO: Awake and alert. Oriented appropriately.  No apparent focal neuro deficit. PSYCH: Calm. Normal affect.   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  Diagnosed with widespread mets in 06/2021.  Had liver biopsy in [place] in 6/23 that showed adenocarcinoma with immunoreactivity not typical for lung cancer.  CT angio chest showed large right suprahilar mass extending continuously into mediastinum and measuring about 6.3 cm maximal diameter with mets to brain, liver, tail of pancreas, left anterior fifth ribs and multiple vertebral bodies as well as the left breast tissue.  MRI brain concerning for brain mets -Started radiation to the spine and ribs on 7/17>> -Pathology from left arm tissue biopsy on 7/21 shows adenocarcinoma but unclear primary.  -[facility] consulted and recommended CT abdomen and pelvis -Appreciate help by palliative medicine with pain management  Cancer related pain: Still with significant pain requiring IV Dilaudid especially with radiation and at night -Appreciate help by palliative medicine-adjusting pain meds -Aggressive bowel regimen -DME including hospital bed, walker and bedside commode ordered.   Left arm swelling: reported on 7/13.  CT left humerus on 7/14 showed 3.7 cm left infraspinatus muscle, 6.2 cm left biceps muscle and other multiple soft tissue masses along the left chest wall and within the left breast soft tissues consistent with metastatic disease  Recent UY:[id number] PE diagnosed on 08/22/21.  She was discharged on 2 L by .  Now on room air. -Ambulatory saturation assessment -Continue Eliquis.  Sinus tachycardia: Likely due to pain and underlying PE.  Resolved. -Continue metoprolol 50 mg twice daily  Anemia of chronic disease: Likely due to malignancy. Recent Labs    08/21/21 2137 08/22/21 0325 08/23/21 0149 08/24/21 0058 08/25/21 0033 09/05/2021 2149 09/04/21 0500 09/06/21 0423 09/11/21 0409 09/14/21 0429  HGB 10.5* 9.2* 9.6* 9.7* 10.0* 9.6* 8.9* 9.1* 8.6* 9.4*  -Monitor intermittently  Physical deconditioning -Therapy recommended home health.   -Hospital bed, RW and 3 and 1 ordered.  Lactic acidosis: Likely type B.  Elevated AST: CK within normal.  Earwax in left ear: -Debrox eardrop  Bandemia: Likely from malignancy and demargination. -Monitor  Body mass index is 27.32 kg/m.   DVT prophylaxis:   apixaban (ELIQUIS) tablet 5 mg  Code Status: Full code Family Communication: None at bedside. Level of care: Med-Surg Status is: Inpatient Remains inpatient appropriate because: Intractable cancer pain   Final disposition: Likely home with HH and DME Consultants:  Radiation oncology Palliative medicine Interventional radiology  Sch Meds:  Scheduled Meds:  apixaban   5 mg Oral BID   carbamide peroxide  5 drop Left EAR BID   celecoxib  200 mg Oral BID   dexamethasone (DECADRON) injection  8 mg Intravenous Q24H   famotidine  20 mg Oral QHS   fentaNYL  1 patch Transdermal Q72H   gabapentin  200 mg Oral TID   latanoprost  1 drop Both Eyes QHS   lidocaine  1 patch Transdermal Q24H   liver oil-zinc oxide   Topical Daily   metoprolol tartrate  50 mg Oral BID   oxyCODONE  20 mg Oral Q12H   senna  1 tablet Oral QHS   sodium chloride flush  3 mL Intravenous Q12H   traZODone  100 mg Oral QHS   Continuous Infusions: PRN Meds:.acetaminophen **OR** acetaminophen, HYDROmorphone (DILAUDID) injection, HYDROmorphone, magnesium hydroxide, ondansetron **OR** ondansetron (ZOFRAN) IV, mouth rinse, polyethylene glycol, senna-docusate  Antimicrobials: Anti-infectives (From admission, onward)    None        I have personally reviewed the following labs and images: CBC: Recent Labs  Lab 09/14/21 0429  WBC 14.5*  HGB 9.4*  HCT 30.8*  MCV 76.4*  PLT 436*   BMP &GFR Recent Labs  Lab 09/14/21 0429  NA 139  K 4.6  CL 97*  CO2 27  GLUCOSE 119*  BUN 22*  CREATININE 0.64  CALCIUM 9.6  MG 2.3  PHOS 3.4   Estimated Creatinine Clearance: 88.7 mL/min (by C-G formula based on SCr of 0.64 mg/dL). Liver & Pancreas: Recent Labs  Lab 09/14/21 0429  AST 57*  ALT 31  ALKPHOS 530*  BILITOT 0.7  PROT 7.6  ALBUMIN 2.7*   No results for input(s): "LIPASE", "AMYLASE" in the last 168 hours. No results for input(s): "AMMONIA" in the last 168 hours. Diabetic: No results for input(s): "HGBA1C" in the last 72 hours. No results for input(s): "GLUCAP" in the last 168 hours. Cardiac Enzymes: No results for input(s): "CKTOTAL", "CKMB", "CKMBINDEX", "TROPONINI" in the last 168 hours.  No results for input(s): "PROBNP" in the last 8760 hours. Coagulation Profile: No results for input(s): "INR", "PROTIME" in the last 168 hours. Thyroid Function Tests: No results  for input(s): "TSH", "T4TOTAL", "FREET4", "T3FREE", "THYROIDAB" in the last 72 hours.  Lipid Profile: No results for input(s): "CHOL", "HDL", "LDLCALC", "TRIG", "CHOLHDL", "LDLDIRECT" in the last 72 hours. Anemia Panel: No results for input(s): "VITAMINB12", "FOLATE", "FERRITIN", "TIBC", "IRON", "RETICCTPCT" in the last 72 hours. Urine analysis:    Component Value Date/Time   COLORURINE YELLOW 02/14/2010 0600   APPEARANCEUR CLEAR 02/14/2010 0600   LABSPEC 1.028 02/14/2010 0600   PHURINE 5.5 02/14/2010 0600   GLUCOSEU NEGATIVE 02/14/2010 0600   HGBUR NEGATIVE 02/14/2010 0600   BILIRUBINUR LARGE (A) 02/14/2010 0600   KETONESUR NEGATIVE 02/14/2010 0600   PROTEINUR NEGATIVE 02/14/2010 0600   UROBILINOGEN 1.0 02/14/2010 0600   NITRITE NEGATIVE 02/14/2010 0600   LEUKOCYTESUR  02/14/2010 0600    NEGATIVE MICROSCOPIC NOT DONE ON URINES WITH NEGATIVE PROTEIN, BLOOD, LEUKOCYTES, NITRITE, OR GLUCOSE <1000 mg/dL.   Sepsis Labs: Invalid input(s): "PROCALCITONIN", "LACTICIDVEN"  Microbiology: No results found for this or any previous visit (from the past 240 hour(s)).  Radiology Studies: No results found.    Dainelle Hun T. Lutcher  If 7PM-7AM, please contact night-coverage [email or web address] 09/19/2021, 12:50 PM

## 2021-09-19 NOTE — Progress Notes (Signed)
Daily Progress Note   Patient Name: [name]       Date: 09/19/2021 DOB: 03-27-1965  Age: 56 y.o. MRN#: [id number] Attending Physician: Mercy Riding, MD Primary Care Physician: Pcp, No Admit Date: 09/13/2021  Reason for Consultation/Follow-up: Pain control  Subjective: Patient has just returned from radiation, she states that the pain is well controlled.  She recalls her discussions with Dr. Earlie Server regarding her recent MRI brain findings as well as the extensive nature of her disease.  She states that she is taking it "one day at a time".      Length of Stay: 15  Current Medications: Scheduled Meds:   apixaban  5 mg Oral BID   carbamide peroxide  5 drop Left EAR BID   celecoxib  200 mg Oral BID   dexamethasone (DECADRON) injection  8 mg Intravenous Q24H   famotidine  20 mg Oral QHS   fentaNYL  1 patch Transdermal Q72H   gabapentin  200 mg Oral TID   latanoprost  1 drop Both Eyes QHS   lidocaine  1 patch Transdermal Q24H   liver oil-zinc oxide   Topical Daily   metoprolol tartrate  50 mg Oral BID   oxyCODONE  20 mg Oral Q12H   senna  1 tablet Oral QHS   sodium chloride flush  3 mL Intravenous Q12H   traZODone  100 mg Oral QHS    Continuous Infusions:   PRN Meds: acetaminophen **OR** acetaminophen, HYDROmorphone (DILAUDID) injection, HYDROmorphone, magnesium hydroxide, ondansetron **OR** ondansetron (ZOFRAN) IV, mouth rinse, polyethylene glycol, senna-docusate  Physical Exam         Awake alert Resting in bed No distress Regular work of breathing S 1 S 2 Abdomen not distended  Vital Signs: BP 116/81 (BP Location: Right Arm)   Pulse 91   Temp 98.3 F (36.8 C) (Oral)   Resp 20   Ht 5\' 9"  (1.753 m)   Wt 83.9 kg   SpO2 100%   BMI 27.32 kg/m  SpO2: SpO2:  100 % O2 Device: O2 Device: Room Air O2 Flow Rate: O2 Flow Rate (L/min): 2 L/min  Intake/output summary:  Intake/Output Summary (Last 24 hours) at 09/19/2021 1247 Last data filed at 09/19/2021 0900 Gross per 24 hour  Intake 600 ml  Output --  Net 600 ml  LBM: Last BM Date : 09/18/21 Baseline Weight: Weight: 83.9 kg Most recent weight: Weight: 83.9 kg       Palliative Assessment/Data:      Patient Active Problem List   Diagnosis Date Noted   Metastasis to brain ([place]) 09/18/2021   Sinus tachycardia 09/12/2021   Anemia of chronic disease 09/11/2021   Bandemia 09/11/2021   Cancer related pain 09/10/2021   Lactic acidosis 09/10/2021   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[place]) 09/06/2021   Pain from bone metastases ([place]) 09/06/2021   Chest wall pain 09/04/2021   Metastatic adenocarcinoma ([place]) 09/04/2021   Intractable pain 09/04/2021   Pulmonary embolism ([place]) 08/22/2021   Acute pulmonary embolism, unspecified pulmonary embolism type, unspecified whether acute cor pulmonale present ([place]) 08/22/2021   Primary cancer of right upper lobe of lung ([place]) 07/08/2018   Urinary incontinence 04/05/2011    Palliative Care Assessment & Plan   Patient Profile:    Assessment:  56 year old F with PMH of 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  Patient was discharged on 2 L by [place] last hospitalization.    Radiation oncology consulted, and patient started radiation treatment on 7/17.  Pain control remains barriers to discharge.  Oncology and palliative medicine consulted.    Patient underwent US-guided biopsy of LUE soft tissue mass on 7/21.  Pathology pending.  Palliative medicine following for pain management  Recommendations/Plan: Pain  management recommendations discussed with patient after medication history was reviewed:  1. trans dermal Fentanyl 75 mcg Q 72 hours.   2. Weaning down the PO OxyContin, continue  20 mg PO BID in order to have the patient on only one long acting opioid, transdermal Fentanyl.   3.  Continue PO Dilaudid 2 mg Q 3 hours for moderate pain and 4 mg PO Q 3 hours for severe pain. Use first and use PRN IV Dilaudid for breakthrough pain.  Continue other adjuvants and monitor. Patient states that she needs IV Dilaudid for breakthouh pain in order to tolerate her radiation treatments. Continue to monitor her pain regimen. Radiation oncology input is being sought for her MRI brain findings.     Goals of Care and Additional Recommendations: Limitations on Scope of Treatment: Full Scope Treatment  Code Status:    Code Status Orders  (From admission, onward)           Start     Ordered   09/04/21 0035  Full code  Continuous        09/04/21 0037           Code Status History     Date Active Date Inactive Code Status Order ID Comments User Context   08/22/2021 0743 08/27/2021 0129 Full Code [id number]  [name], MD Inpatient   08/22/2021 0309 08/22/2021 0743 Full Code [id number]  Howerter, Ethelda Chick, DO ED       Prognosis:  Unable to determine  Discharge Planning: To Be Determined  Care plan was discussed with  patient  RN.   Thank you for allowing the Palliative Medicine Team to assist in the care of this patient.  MOD MDM.     Greater than 50%  of this time was spent counseling and coordinating care related to the above assessment and plan.  Loistine Chance, MD  Please contact Palliative Medicine Team phone at [phone] for questions and concerns.

## 2021-09-20 ENCOUNTER — Encounter: Payer: Self-pay | Admitting: Urology

## 2021-09-20 ENCOUNTER — Inpatient Hospital Stay (HOSPITAL_COMMUNITY): Payer: BLUE CROSS/BLUE SHIELD

## 2021-09-20 ENCOUNTER — Ambulatory Visit
Admit: 2021-09-20 | Discharge: 2021-09-20 | Disposition: A | Discharge status: Home / Self Care | Payer: BLUE CROSS/BLUE SHIELD | Attending: Radiation Oncology | Admitting: Radiation Oncology

## 2021-09-20 ENCOUNTER — Ambulatory Visit: Payer: BLUE CROSS/BLUE SHIELD

## 2021-09-20 ENCOUNTER — Other Ambulatory Visit: Payer: Self-pay

## 2021-09-20 DIAGNOSIS — G893 Neoplasm related pain (acute) (chronic): Secondary | ICD-10-CM | POA: Diagnosis not present

## 2021-09-20 DIAGNOSIS — R52 Pain, unspecified: Secondary | ICD-10-CM | POA: Diagnosis not present

## 2021-09-20 DIAGNOSIS — R000 Tachycardia, unspecified: Secondary | ICD-10-CM | POA: Diagnosis not present

## 2021-09-20 DIAGNOSIS — L8990 Pressure ulcer of unspecified site, unspecified stage: Secondary | ICD-10-CM | POA: Insufficient documentation

## 2021-09-20 DIAGNOSIS — R0789 Other chest pain: Secondary | ICD-10-CM | POA: Diagnosis not present

## 2021-09-20 LAB — RAD ONC ARIA SESSION SUMMARY
Course Elapsed Days: 11
Plan Fractions Treated to Date: 7
Plan Fractions Treated to Date: 8
Plan Prescribed Dose Per Fraction: 3 Gy
Plan Prescribed Dose Per Fraction: 3 Gy
Plan Total Fractions Prescribed: 10
Plan Total Fractions Prescribed: 9
Plan Total Prescribed Dose: 27 Gy
Plan Total Prescribed Dose: 30 Gy
Reference Point Dosage Given to Date: 24 Gy
Reference Point Dosage Given to Date: 24 Gy
Reference Point Session Dosage Given: 3 Gy
Reference Point Session Dosage Given: 3 Gy
Session Number: 8

## 2021-09-20 MED ORDER — MEDIHONEY WOUND/BURN DRESSING EX PSTE
1.0000 | PASTE | Freq: Every day | CUTANEOUS | Status: DC
  Administered 2021-09-20 – 2021-09-23 (×4): 1 via TOPICAL
  Filled 2021-09-20: qty 44

## 2021-09-20 MED ORDER — IOHEXOL 300 MG/ML  SOLN
100.0000 mL | Freq: Once | INTRAMUSCULAR | Status: AC | PRN
  Administered 2021-09-20: 100 mL via INTRAVENOUS

## 2021-09-20 NOTE — Consult Note (Signed)
[facility] Nurse Consult Note: Reason for Consult: Consult requested for bilat buttocks.  Pt states "I had some open places there before I came in." Thse have declined since admission, according to the bedside nurse. Bilat buttocks with patchy areas of partial thickness scattered across left buttock and inner gluteal fold, pink and moist, most are approx .1X.1X.1cm. Appearance and location is consistent with moisture associated skin damage. Right buttock has 2 full thickness wounds from previous MASD which has evolved. 1X2X.1cm and .5X.5X.1cm, 100% yellow and moist.   ICD-10 CM Codes for Irritant Dermatitis L24A9 - Due to friction or contact with other specified body fluids  Dressing procedure/placement/frequency: Topical treatment orders provided for bedside nurses to perform as follows to assist with removal of nonviable tissue: Apply Medihoney to right buttock wounds in 2 locations Q day, then cover with foam dressing. (Change foam dressing Q 3 days or PRN soiling.) Please re-consult if further assistance is needed.  Thank-you,  Julien Girt MSN, [place], [place], [place], [place]

## 2021-09-20 NOTE — Progress Notes (Signed)
PROGRESS NOTE  [name] VFI:[id number] DOB: 1965/03/25   PCP: Pcp, No  Patient is from: Home  DOA: 08/28/2021 LOS: 9  Chief complaints Chief Complaint  Patient presents with   Chest Pain     Brief Narrative / Interim history: 56 year old F with PMH of stage Ib non-small lung cancer s/p RUL lobectomy in 2020 followed by adjuvant treatment, recent diagnosis of widespread metastatic adenocarcinoma with unclear primary, recent hospitalization from 6/28-7/3 for acute respiratory failure with hypoxia in the setting of acute PE and and cancer related pain returning with cancer related pain that was not controlled with home regimen, and admitted for the same.  Patient was discharged on 2 L by [place] last hospitalization.  In ED, afebrile but tachycardic.  WBC 17.3 with left shift.  Hgb 9.6.  ALP elevated to 426.  CXR with stable right healer and suprahilar mass and a stable right basilar opacity.  Patient was started on IV pain medication and admitted for pain control.    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  Pain control remains barriers to discharge.  Oncology and palliative medicine consulted.   Patient underwent US-guided biopsy of LUE soft tissue mass on 7/21.  Pathology showed adenocarcinoma of unclear primary.  MRI brain concerning for brain mets and extracranial soft tissue mets.  [facility] consulted per recommendation by oncology.  CT abdomen and pelvis ordered.  Palliative medicine following for pain management  Subjective: Seen and examined earlier this morning.  No major events overnight of this morning.  No complaints.  Pain fairly controlled except when she lies flat.   Objective: Vitals:   09/19/21 1408 09/19/21 2134 09/20/21 0524 09/20/21 1350  BP: 124/70 121/74 122/78 110/67  Pulse: 92 99 93 84  Resp: 20 18  20   Temp: 98.6 F (37 C) 98 F (36.7 C) 98.4 F (36.9 C) 97.6 F (36.4 C)  TempSrc: Oral  Oral Oral Oral  SpO2: 98% 100% 100% 98%  Weight:      Height:        Examination:  GENERAL: No apparent distress.  Nontoxic. HEENT: MMM.  Vision and hearing grossly intact.  NECK: Supple.  No apparent JVD.  RESP:  No IWOB.  Fair aeration bilaterally. CVS:  RRR. Heart sounds normal.  ABD/GI/GU: BS+. Abd soft, NTND.  MSK/EXT:  Moves extremities. No apparent deformity.  Trace BLE edema.  Some swelling in the left biceps SKIN: no apparent skin lesion or wound NEURO: Awake and alert. Oriented appropriately.  No apparent focal neuro deficit. PSYCH: Calm. Normal affect.   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  Diagnosed with widespread mets in 06/2021.  Had liver biopsy in [place] in 6/23 that showed adenocarcinoma with immunoreactivity not typical for lung cancer.  CT angio chest showed large right suprahilar mass extending continuously into mediastinum and  measuring about 6.3 cm maximal diameter with mets to brain, liver, tail of pancreas, left anterior fifth ribs and multiple vertebral bodies as well as the left breast tissue.  MRI brain concerning for brain mets -Started radiation to the spine and ribs on 7/17>> -Pathology from left arm tissue biopsy on 7/21 shows adenocarcinoma but unclear primary.  -[facility] consulted and recommended CT abdomen and pelvis-ordered. -Appreciate help by palliative medicine with  pain management  Cancer related pain: Still with significant pain requiring IV Dilaudid especially with radiation and at night -Appreciate help by palliative medicine-adjusting pain meds -Aggressive bowel regimen -DME including hospital bed, walker and bedside commode ordered.   Left arm swelling: reported on 7/13.  CT left humerus on 7/14 showed 3.7 cm left infraspinatus muscle, 6.2 cm left biceps muscle and other multiple soft tissue masses along the left chest wall and within the left breast soft tissues consistent with metastatic disease  Recent NA:TFTDDUKGU PE diagnosed on 08/22/21.  She was discharged on 2 L by [place].  Now on room air. -Continue Eliquis.  Sinus tachycardia: Likely due to pain and underlying PE.  Resolved. -Continue metoprolol 50 mg twice daily  Anemia of chronic disease: Likely due to malignancy. Recent Labs    08/21/21 2137 08/22/21 0325 08/23/21 0149 08/24/21 0058 08/25/21 0033 09/18/2021 2149 09/04/21 0500 09/06/21 0423 09/11/21 0409 09/14/21 0429  HGB 10.5* 9.2* 9.6* 9.7* 10.0* 9.6* 8.9* 9.1* 8.6* 9.4*  -Monitor intermittently  Physical deconditioning -Therapy recommended home health.   -Hospital bed, RW and 3 and 1 ordered.  Lactic acidosis: Likely type B.  Elevated AST: CK within normal.  Earwax in left ear: -Debrox eardrop for 3 days  Bandemia: Likely from malignancy and demargination. -Monitor  Body mass index is 27.32 kg/m.   DVT prophylaxis:   apixaban (ELIQUIS) tablet 5 mg  Code Status: Full code Family Communication: None at bedside. Level of care: Med-Surg Status is: Inpatient Remains inpatient appropriate because: Intractable cancer pain   Final disposition: Likely home with HH and DME Consultants:  Radiation oncology Palliative medicine Interventional radiology  Sch Meds:  Scheduled Meds:  apixaban  5 mg Oral BID   carbamide peroxide  5 drop Left EAR BID   celecoxib  200 mg Oral BID   dexamethasone (DECADRON)  injection  8 mg Intravenous Q24H   famotidine  20 mg Oral QHS   fentaNYL  1 patch Transdermal Q72H   gabapentin  200 mg Oral TID   latanoprost  1 drop Both Eyes QHS   leptospermum manuka honey  1 Application Topical Daily   lidocaine  1 patch Transdermal Q24H   metoprolol tartrate  50 mg Oral BID   oxyCODONE  20 mg Oral Q12H   senna  1 tablet Oral QHS   sodium chloride flush  3 mL Intravenous Q12H   traZODone  100 mg Oral QHS   Continuous Infusions: PRN Meds:.acetaminophen **OR** acetaminophen, HYDROmorphone (DILAUDID) injection, HYDROmorphone, magnesium hydroxide, ondansetron **OR** ondansetron (ZOFRAN) IV, mouth rinse, polyethylene glycol, senna-docusate  Antimicrobials: Anti-infectives (From admission, onward)    None        I have personally reviewed the following labs and images: CBC: Recent Labs  Lab 09/14/21 0429  WBC 14.5*  HGB 9.4*  HCT 30.8*  MCV 76.4*  PLT 436*   BMP &GFR Recent Labs  Lab 09/14/21 0429  NA 139  K 4.6  CL 97*  CO2 27  GLUCOSE 119*  BUN 22*  CREATININE 0.64  CALCIUM 9.6  MG 2.3  PHOS 3.4   Estimated Creatinine Clearance: 88.7 mL/min (by C-G formula based on SCr of 0.64 mg/dL). Liver & Pancreas: Recent Labs  Lab 09/14/21 0429  AST 57*  ALT 31  ALKPHOS 530*  BILITOT 0.7  PROT 7.6  ALBUMIN 2.7*   No results for input(s): "LIPASE", "AMYLASE" in the last 168 hours. No results for input(s): "AMMONIA" in the last 168 hours. Diabetic: No results for input(s): "HGBA1C" in the last 72 hours. No results for input(s): "GLUCAP" in the last 168 hours. Cardiac Enzymes: No results for input(s): "CKTOTAL", "CKMB", "CKMBINDEX", "TROPONINI" in the last 168 hours.  No results for input(s): "PROBNP" in the last 8760 hours. Coagulation Profile: No results for input(s): "INR", "PROTIME" in the last 168 hours. Thyroid Function Tests: No results for input(s): "TSH", "T4TOTAL", "FREET4", "T3FREE", "THYROIDAB" in the last 72 hours.  Lipid  Profile: No results for input(s): "CHOL", "HDL", "LDLCALC", "TRIG", "CHOLHDL", "LDLDIRECT" in the last 72 hours. Anemia Panel: No results for input(s): "VITAMINB12", "FOLATE", "FERRITIN", "TIBC", "IRON", "RETICCTPCT" in the last 72 hours. Urine analysis:    Component Value Date/Time   COLORURINE YELLOW 02/14/2010 0600   APPEARANCEUR CLEAR 02/14/2010 0600   LABSPEC 1.028 02/14/2010 0600   PHURINE 5.5 02/14/2010 0600   GLUCOSEU NEGATIVE 02/14/2010 0600   HGBUR NEGATIVE 02/14/2010 0600   BILIRUBINUR LARGE (A) 02/14/2010 0600   KETONESUR NEGATIVE 02/14/2010 0600   PROTEINUR NEGATIVE 02/14/2010 0600   UROBILINOGEN 1.0 02/14/2010 0600   NITRITE NEGATIVE 02/14/2010 0600   LEUKOCYTESUR  02/14/2010 0600    NEGATIVE MICROSCOPIC NOT DONE ON URINES WITH NEGATIVE PROTEIN, BLOOD, LEUKOCYTES, NITRITE, OR GLUCOSE <1000 mg/dL.   Sepsis Labs: Invalid input(s): "PROCALCITONIN", "LACTICIDVEN"  Microbiology: No results found for this or any previous visit (from the past 240 hour(s)).  Radiology Studies: No results found.    Temia Debroux T. [place]  If 7PM-7AM, please contact night-coverage [email or web address] 09/20/2021, 1:58 PM

## 2021-09-20 NOTE — Consult Note (Signed)
Referring Provider: [facility] Primary Care Physician:  Pcp, No Primary Gastroenterologist:  unassigned  Reason for Consultation:    HPI: [name] is a 56 y.o. female with medical history significant for metastatic adenocarcinoma with suspected lung primary, osteoarthritis, irritable bowel syndrome, cardiac murmur as well as over active bladder.   PMH of stage Ib non-small lung cancer s/p RUL lobectomy in 2020 followed by adjuvant treatment.   In May 2023 patient presented to the ED [facility].  She had a CT scan showing numerous hypodense masses throughout the liver and a single mass of the pancreas concerning for metastatic disease.  She underwent a PET scan showing highly metabolically active likely neoplasm in the right BNL pylon with probable hepatic, soft tissue and osseous metastases.  August 05, 2021 patient had ultrasound-guided biopsy of a liver lesion.  Biopsy showed metastatic adenocarcinoma not typical of primary adenocarcinoma of the lung, additional biopsies recommended.   Patient presented to the ED 09/16/2021 with chest pain.  CT angiogram with findings of bilateral segmental and subsegmental pulmonary emboli.  As well as multiple metastatic masses.  Patient underwent another ultrasound-guided left biceps soft tissue mass biopsy 7/21, pathology showed metastatic adenocarcinoma differential include lung pancreaticobiliary and ovarian primaries.  She underwent MRI brain with and without contrast 03/20/2021 showing numerous hemorrhagic metastases in the brain and extracranial soft tissue mets.  GI consulted for evaluation of GI primary source of metastases.  Has nausea, vomiting, abdominal pain, changes in bowel habit.  Denies melena, hematochezia. She is currently on Eliquis twice daily.   Reports history of colonoscopy and upper endoscopy several years ago.  No records in epic.   Past Medical History:  Diagnosis Date   Allergy    Arthritis    Complication of anesthesia    No  pertinent past medical history    PONV (postoperative nausea and vomiting)    nausea with dizziness    Past Surgical History:  Procedure Laterality Date   ABDOMINAL HYSTERECTOMY     CHOLECYSTECTOMY     ENDOMETRIAL ABLATION     KNEE ARTHROSCOPY     LAPAROSCOPIC ASSISTED VAGINAL HYSTERECTOMY  02/13/2011   Procedure: LAPAROSCOPIC ASSISTED VAGINAL HYSTERECTOMY;  Surgeon: Olga Millers;  Location: [facility];  Service: Gynecology;  Laterality: N/A;   SALPINGOOPHORECTOMY  02/13/2011   Procedure: SALPINGO OOPHERECTOMY;  Surgeon: Olga Millers;  Location: [facility];  Service: Gynecology;  Laterality: Bilateral;   TONSILLECTOMY      Prior to Admission medications   Medication Sig Start Date End Date Taking? Authorizing Provider  acetaminophen (TYLENOL) 325 MG tablet Take 2 tablets (650 mg total) by mouth every 6 (six) hours as needed for mild pain (or Fever >/= 101). 08/26/21  Yes Shalhoub, Sherryll Burger, MD  apixaban (ELIQUIS) 5 MG TABS tablet Take 1 tablet (5 mg total) by mouth 2 (two) times daily. 09/01/21  Yes Shalhoub, Sherryll Burger, MD  gabapentin (NEURONTIN) 100 MG capsule Take 200 mg by mouth in the morning and at bedtime. 08/05/21  Yes [provider]  latanoprost (XALATAN) 0.005 % ophthalmic solution Place 1 drop into both eyes at bedtime.   Yes [provider]  oxyCODONE (OXYCONTIN) 20 mg 12 hr tablet Take 20 mg by mouth every 12 (twelve) hours as needed (for pain).   Yes [provider]  oxyCODONE-acetaminophen (PERCOCET/ROXICET) 5-325 MG tablet Take 1-2 tablets by mouth every 4 (four) hours as needed for severe pain.   Yes [provider]  polyethylene glycol (MIRALAX / GLYCOLAX)  17 g packet Take 17 g by mouth 2 (two) times daily. 08/26/21  Yes Shalhoub, Sherryll Burger, MD  senna-docusate (SENOKOT-S) 8.6-50 MG tablet Take 1 tablet by mouth 2 (two) times daily. 08/26/21  Yes Shalhoub, Sherryll Burger, MD  traZODone (DESYREL) 50 MG tablet Take 100 mg by mouth at bedtime. 08/03/21  Yes  [provider]  apixaban (ELIQUIS) 5 MG TABS tablet Take 2 tablets (10 mg total) by mouth 2 (two) times daily for 5 days. Patient not taking: Reported on 09/11/2021 08/26/21 08/31/2021  Vernelle Emerald, MD  baclofen (LIORESAL) 10 MG tablet Take 10 mg by mouth 3 (three) times daily. Patient not taking: Reported on 09/05/2021 08/04/21   [provider]  famotidine (PEPCID) 20 MG tablet Take 20 mg by mouth 2 (two) times daily. Patient not taking: Reported on 08/27/2021 07/02/21   [provider]  fluticasone (FLONASE) 50 MCG/ACT nasal spray Place into both nostrils. 09/07/21   [provider]  naproxen (NAPROSYN) 500 MG tablet Take by mouth. 09/06/21   [provider]    Scheduled Meds:  apixaban  5 mg Oral BID   carbamide peroxide  5 drop Left EAR BID   celecoxib  200 mg Oral BID   dexamethasone (DECADRON) injection  8 mg Intravenous Q24H   famotidine  20 mg Oral QHS   fentaNYL  1 patch Transdermal Q72H   gabapentin  200 mg Oral TID   latanoprost  1 drop Both Eyes QHS   leptospermum manuka honey  1 Application Topical Daily   lidocaine  1 patch Transdermal Q24H   metoprolol tartrate  50 mg Oral BID   oxyCODONE  20 mg Oral Q12H   senna  1 tablet Oral QHS   sodium chloride flush  3 mL Intravenous Q12H   traZODone  100 mg Oral QHS   Continuous Infusions: PRN Meds:.acetaminophen **OR** acetaminophen, HYDROmorphone (DILAUDID) injection, HYDROmorphone, magnesium hydroxide, ondansetron **OR** ondansetron (ZOFRAN) IV, mouth rinse, polyethylene glycol, senna-docusate  Allergies as of 09/15/2021 - Review Complete 09/16/2021  Allergen Reaction Noted   Fish allergy Hives 04/05/2011   Penicillins Hives 02/05/2011   Augmentin [amoxicillin-pot clavulanate] Hives 02/05/2011   Betadine [povidone iodine] Hives and Other (See Comments) 02/05/2011   Shellfish-derived products Hives 08/27/2021   Tetanus toxoids Hives 02/05/2011    Family History  Problem  Relation Age of Onset   Cancer Mother    Heart disease Father    Cancer Brother    Mental illness Son    Cancer Paternal Aunt    Cancer Paternal Uncle    Cancer Maternal Aunt     Social History   Socioeconomic History   Marital status: Divorced    Spouse name: Not on file   Number of children: Not on file   Years of education: Not on file   Highest education level: Not on file  Occupational History   Not on file  Tobacco Use   Smoking status: Never   Smokeless tobacco: Not on file  Substance and Sexual Activity   Alcohol use: Yes    Alcohol/week: 2.0 standard drinks of alcohol    Types: 2 Glasses of wine per week    Comment: occasionally   Drug use: No   Sexual activity: Not Currently  Other Topics Concern   Not on file  Social History Narrative   Not on file   Social Determinants of Health   Financial Resource Strain: Not on file  Food Insecurity: Not on file  Transportation  Needs: Not on file  Physical Activity: Not on file  Stress: Not on file  Social Connections: Not on file  Intimate Partner Violence: Not on file    Review of Systems: All negative except as stated above in HPI.  Physical Exam:Physical Exam Constitutional:      General: She is not in acute distress.    Appearance: Normal appearance. She is normal weight. She is ill-appearing. She is not toxic-appearing.  HENT:     Head: Normocephalic and atraumatic.     Right Ear: External ear normal.     Left Ear: External ear normal.     Nose: Nose normal.     Mouth/Throat:     Mouth: Mucous membranes are moist.  Eyes:     Pupils: Pupils are equal, round, and reactive to light.  Cardiovascular:     Rate and Rhythm: Normal rate and regular rhythm.     Pulses: Normal pulses.     Heart sounds: Normal heart sounds.  Pulmonary:     Effort: Pulmonary effort is normal.     Breath sounds: Normal breath sounds.  Abdominal:     General: Abdomen is flat. Bowel sounds are normal. There is no distension.      Palpations: Abdomen is soft. There is no mass.     Tenderness: There is no abdominal tenderness. There is no guarding or rebound.     Hernia: No hernia is present.  Musculoskeletal:        General: No swelling. Normal range of motion.     Cervical back: Normal range of motion and neck supple.  Skin:    General: Skin is warm and dry.  Neurological:     General: No focal deficit present.     Mental Status: She is alert and oriented to person, place, and time. Mental status is at baseline.  Psychiatric:        Mood and Affect: Mood normal.        Behavior: Behavior normal.     Vital signs: Vitals:   09/19/21 2134 09/20/21 0524  BP: 121/74 122/78  Pulse: 99 93  Resp: 18   Temp: 98 F (36.7 C) 98.4 F (36.9 C)  SpO2: 100% 100%   Last BM Date : 09/19/21    GI:  Lab Results: No results for input(s): "WBC", "HGB", "HCT", "PLT" in the last 72 hours. BMET No results for input(s): "NA", "K", "CL", "CO2", "GLUCOSE", "BUN", "CREATININE", "CALCIUM" in the last 72 hours. LFT No results for input(s): "PROT", "ALBUMIN", "AST", "ALT", "ALKPHOS", "BILITOT", "BILIDIR", "IBILI" in the last 72 hours. PT/INR No results for input(s): "LABPROT", "INR" in the last 72 hours.   Studies/Results: No results found.  Impression: Metastatic carcinoma of unknown primary etiology.  Ultrasound-guided biopsy of a liver lesion 6/12 Pathology showed metastatic adenocarcinoma not typical of primary adenocarcinoma of the lung, additional biopsies recommended.   Ultrasound-guided left biceps soft tissue mass biopsy 7/21  pathology showed metastatic adenocarcinoma differential include lung, pancreaticobiliary, and ovarian primaries.  Ordered CT abdomen pelvis awaiting results.  Plan: Awaiting CT scan results to evaluate whether EGD or colonoscopy may be needed to help diagnose primary malignancy. Continue supportive care [facility] will follow.  LOS: 16 days   Charlott Rakes   PA-C 09/20/2021, 10:35 AM  Contact #  [phone]

## 2021-09-21 DIAGNOSIS — R0789 Other chest pain: Secondary | ICD-10-CM | POA: Diagnosis not present

## 2021-09-21 LAB — CBC
HCT: 26.8 % — ABNORMAL LOW (ref 36.0–46.0)
Hemoglobin: 8.5 g/dL — ABNORMAL LOW (ref 12.0–15.0)
MCH: 23.9 pg — ABNORMAL LOW (ref 26.0–34.0)
MCHC: 31.7 g/dL (ref 30.0–36.0)
MCV: 75.5 fL — ABNORMAL LOW (ref 80.0–100.0)
Platelets: 294 10*3/uL (ref 150–400)
RBC: 3.55 MIL/uL — ABNORMAL LOW (ref 3.87–5.11)
RDW: 18.6 % — ABNORMAL HIGH (ref 11.5–15.5)
WBC: 14.6 10*3/uL — ABNORMAL HIGH (ref 4.0–10.5)
nRBC: 0.3 % — ABNORMAL HIGH (ref 0.0–0.2)

## 2021-09-21 LAB — PROTIME-INR
INR: 1.8 — ABNORMAL HIGH (ref 0.8–1.2)
Prothrombin Time: 20.3 seconds — ABNORMAL HIGH (ref 11.4–15.2)

## 2021-09-21 LAB — APTT: aPTT: 40 seconds — ABNORMAL HIGH (ref 24–36)

## 2021-09-21 MED ORDER — PEG 3350-KCL-NA BICARB-NACL 420 G PO SOLR
4000.0000 mL | Freq: Once | ORAL | Status: AC
  Administered 2021-09-22: 4000 mL via ORAL

## 2021-09-21 MED ORDER — HEPARIN (PORCINE) 25000 UT/250ML-% IV SOLN
1350.0000 [IU]/h | INTRAVENOUS | Status: DC
  Administered 2021-09-21: 1350 [IU]/h via INTRAVENOUS
  Filled 2021-09-21: qty 250

## 2021-09-21 MED ORDER — SODIUM CHLORIDE 0.9 % IV SOLN: INTRAVENOUS | Status: DC

## 2021-09-21 NOTE — Progress Notes (Signed)
ANTICOAGULATION CONSULT NOTE - Initial Consult  Pharmacy Consult for heparin Indication: pulmonary embolus  Allergies  Allergen Reactions   Fish Allergy Hives   Penicillins Hives   Augmentin [Amoxicillin-Pot Clavulanate] Hives   Betadine [Povidone Iodine] Hives and Other (See Comments)    Bad hives, per pt   Shellfish-Derived Products Hives   Tetanus Toxoids Hives    Patient Measurements: Height: 5\' 9"  (175.3 cm) Weight: 83.9 kg (185 lb) IBW/kg (Calculated) : 66.2 Heparin Dosing Weight: 83 kg  Vital Signs: BP: 118/61 (07/29 1024) Pulse Rate: 93 (07/29 1024)  Labs: No results for input(s): "HGB", "HCT", "PLT", "APTT", "LABPROT", "INR", "HEPARINUNFRC", "HEPRLOWMOCWT", "CREATININE", "CKTOTAL", "CKMB", "TROPONINIHS" in the last 72 hours.  Estimated Creatinine Clearance: 88.7 mL/min (by C-G formula based on SCr of 0.64 mg/dL).   Medical History: Past Medical History:  Diagnosis Date   Allergy    Arthritis    Complication of anesthesia    No pertinent past medical history    PONV (postoperative nausea and vomiting)    nausea with dizziness      Assessment: 56 year old female admitted with chest pain. Patient recently hospitalized 6/28-7/3 for respiratory failure in the setting of acute bilateral PE. Patient was started on Eliquis. Patient has been on Eliquis since admission, now with need for EGD/colonoscopy and will need to be off Eliquis. Plan for heparin infusion while Eliquis on hold - current plan for procedures on Monday. Per GI, will need to hold heparin 6 hours prior to start of procedure.  Last dose of Eliquis 7/29 ~ 1030.  Goal of Therapy:  Heparin level 0.3-0.7 units/ml aPTT 66-102 seconds Monitor platelets by anticoagulation protocol: Yes   Plan:  -Given last dose of Eliquis today ~ 1030, will start heparin at 2200 -Heparin infusion at 1350 units/hr -Check aPTT 6 hours after start of infusion - will follow aPTT until correlation with HL -Daily CBC and  HL -GI - please specify time for heparin to be held Monday morning once schedule known  Tawnya Crook, PharmD, BCPS Clinical Pharmacist 09/21/2021 2:32 PM

## 2021-09-21 NOTE — Plan of Care (Signed)
  Problem: Clinical Measurements: Goal: Ability to maintain clinical measurements within normal limits will improve Outcome: Progressing   Problem: Coping: Goal: Level of anxiety will decrease Outcome: Progressing   Problem: Pain Managment: Goal: General experience of comfort will improve Outcome: Progressing   

## 2021-09-21 NOTE — Progress Notes (Signed)
PROGRESS NOTE  [name] YCX:[id number] DOB: 04/14/1965   PCP: Pcp, No  Patient is from: Home  DOA: 09/15/2021 LOS: 23  Chief complaints Chief Complaint  Patient presents with   Chest Pain     Brief Narrative / Interim history: 56 year old F with PMH of stage Ib non-small lung cancer s/p RUL lobectomy in 2020 followed by adjuvant treatment, recent diagnosis of widespread metastatic adenocarcinoma with unclear primary, recent hospitalization from 6/28-7/3 for acute respiratory failure with hypoxia in the setting of acute PE and and cancer related pain returning with cancer related pain that was not controlled with home regimen, and admitted for the same.  Patient was discharged on 2 L by [place] last hospitalization.  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  Pain control on 7/29 but now needs EGD/colonoscopy-- will need to be off eliquis.    Patient underwent US-guided biopsy of LUE soft tissue mass on 7/21.  Pathology showed adenocarcinoma of unclear primary.  MRI brain concerning for brain mets and extracranial soft tissue mets.  [facility] consulted per recommendation by oncology.  CT abdomen and pelvis ordered.  Palliative medicine following for pain management  Subjective: Pain controlled  Objective: Vitals:   09/20/21 0524 09/20/21 1350 09/20/21 2036 09/21/21 1024  BP: 122/78 110/67 127/74 118/61  Pulse: 93 84 95 93  Resp:  20 19   Temp: 98.4 F (36.9 C) 97.6 F (36.4 C) 97.8 F (36.6 C)   TempSrc: Oral Oral Oral   SpO2: 100% 98% 96%   Weight:      Height:        Examination:   General: Appearance:     Overweight female in no acute distress     Lungs:      respirations unlabored  Heart:    Normal heart rate. Normal rhythm. No murmurs, rubs, or gallops.   MS:   All extremities are intact.   Neurologic:   Awake, alert, oriented x 3. No apparent focal neurological           defect.         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  Diagnosed with widespread mets in 06/2021.  Had liver biopsy in [place] in 6/23 that showed adenocarcinoma with immunoreactivity not typical for lung cancer.  CT angio chest showed large right suprahilar mass extending continuously into mediastinum and measuring about 6.3 cm maximal diameter with mets to brain, liver, tail of pancreas, left anterior fifth ribs and multiple vertebral bodies as well as the left breast tissue.  MRI brain concerning for brain mets -Started radiation to the spine and ribs on 7/17>> -Pathology from left arm tissue biopsy on 7/21 shows adenocarcinoma but unclear primary.  -[facility] consulted and recommended CT abdomen and pelvis-ordered. -now plan for EGD/colonoscopy  Cancer related pain:  -Appreciate help by palliative medicine-adjusting pain meds -Aggressive bowel regimen  Currently seems to be controlled on current regimen   Left arm swelling: reported on 7/13.  CT left humerus on 7/14 showed 3.7 cm left infraspinatus muscle, 6.2 cm left biceps muscle and other multiple soft tissue masses along the left chest wall and within the left breast soft tissues consistent with metastatic disease  Recent WF:UXNATFTDD PE diagnosed on 08/22/21.  She was discharged on 2 L by [place].  Now on room air. -heparin gtt until colonoscopy and EGD done then resume eliquis  Sinus tachycardia: Likely due to pain and  underlying PE.  Resolved. -Continue metoprolol 50 mg twice daily  Anemia of chronic disease: Likely due to malignancy.  Physical deconditioning -Therapy recommended home health.   -Hospital bed, RW and 3 and 1 ordered.  Earwax in left ear: -Debrox eardrop for 3 days  Bandemia: Likely from malignancy and demargination. -Monitor     DVT prophylaxis:    Code Status: Full code Family Communication: None at bedside. Level of care: Med-Surg Status is: Inpatient Remains inpatient appropriate because: Intractable cancer pain   Final disposition: Likely home with HH and DME Consultants:  Radiation oncology Palliative medicine Interventional radiology  Sch Meds:  Scheduled Meds:  carbamide peroxide  5 drop Left EAR BID   celecoxib  200 mg Oral BID   dexamethasone (DECADRON) injection  8 mg Intravenous Q24H   famotidine  20 mg Oral QHS   fentaNYL  1 patch Transdermal Q72H   gabapentin  200 mg Oral TID   latanoprost  1 drop Both Eyes QHS   leptospermum manuka honey  1 Application Topical Daily   lidocaine  1 patch Transdermal Q24H   metoprolol tartrate  50 mg Oral BID   oxyCODONE  20 mg Oral Q12H   [START ON 09/22/2021] polyethylene glycol-electrolytes  4,000 mL Oral Once   senna  1 tablet Oral QHS   sodium chloride flush  3 mL Intravenous Q12H   traZODone  100 mg Oral QHS   Continuous Infusions:  sodium chloride     PRN Meds:.acetaminophen **OR** acetaminophen, HYDROmorphone (DILAUDID) injection, HYDROmorphone, magnesium hydroxide, ondansetron **OR** ondansetron (ZOFRAN) IV, mouth rinse, polyethylene glycol, senna-docusate  Antimicrobials: Anti-infectives (From admission, onward)    None        I have personally reviewed the following labs and images: CBC: No results for input(s): "WBC", "NEUTROABS", "HGB", "HCT", "MCV", "PLT" in the last 168 hours.  BMP &GFR No results for input(s): "NA", "K", "CL", "CO2", "GLUCOSE", "BUN", "CREATININE", "CALCIUM", "MG",  "PHOS" in the last 168 hours.  Invalid input(s): "GFRCG"  Estimated Creatinine Clearance: 88.7 mL/min (by C-G formula based on SCr of 0.64 mg/dL). Liver & Pancreas: No results for input(s): "AST", "ALT", "ALKPHOS", "BILITOT", "PROT", "ALBUMIN" in the last 168 hours.  No results for input(s): "LIPASE", "AMYLASE" in the last 168 hours. No results for input(s): "AMMONIA" in the last 168 hours. Diabetic: No results for input(s): "HGBA1C" in the last 72 hours. No results for input(s): "GLUCAP" in the last 168 hours. Cardiac Enzymes: No results for input(s): "CKTOTAL", "CKMB", "CKMBINDEX", "TROPONINI" in the last 168 hours.  No results for input(s): "PROBNP" in the last 8760 hours. Coagulation Profile: No results for input(s): "INR", "PROTIME" in the last 168 hours. Thyroid Function Tests: No results for input(s): "TSH", "T4TOTAL", "FREET4", "T3FREE", "THYROIDAB" in the last 72 hours.  Lipid Profile: No results for input(s): "CHOL", "HDL", "LDLCALC", "TRIG", "CHOLHDL", "LDLDIRECT" in the last 72 hours. Anemia Panel: No results for input(s): "VITAMINB12", "FOLATE", "FERRITIN", "TIBC", "IRON", "RETICCTPCT"  in the last 72 hours. Urine analysis:    Component Value Date/Time   COLORURINE YELLOW 02/14/2010 0600   APPEARANCEUR CLEAR 02/14/2010 0600   LABSPEC 1.028 02/14/2010 0600   PHURINE 5.5 02/14/2010 0600   GLUCOSEU NEGATIVE 02/14/2010 0600   HGBUR NEGATIVE 02/14/2010 0600   BILIRUBINUR LARGE (A) 02/14/2010 0600   KETONESUR NEGATIVE 02/14/2010 0600   PROTEINUR NEGATIVE 02/14/2010 0600   UROBILINOGEN 1.0 02/14/2010 0600   NITRITE NEGATIVE 02/14/2010 0600   LEUKOCYTESUR  02/14/2010 0600    NEGATIVE MICROSCOPIC NOT DONE ON URINES WITH NEGATIVE PROTEIN, BLOOD, LEUKOCYTES, NITRITE, OR GLUCOSE <1000 mg/dL.   Sepsis Labs: Invalid input(s): "PROCALCITONIN", "LACTICIDVEN"  Microbiology: No results found for this or any previous visit (from the past 240 hour(s)).  Radiology Studies: CT  ABDOMEN PELVIS W CONTRAST  Result Date: 09/20/2021 CLINICAL DATA:  Metastatic adenocarcinoma. Known brain metastases (stage IV). Additional metastatic disease evaluation. EXAM: CT ABDOMEN AND PELVIS WITH CONTRAST TECHNIQUE: Multidetector CT imaging of the abdomen and pelvis was performed using the standard protocol following bolus administration of intravenous contrast. RADIATION DOSE REDUCTION: This exam was performed according to the departmental dose-optimization program which includes automated exposure control, adjustment of the mA and/or kV according to patient size and/or use of iterative reconstruction technique. CONTRAST:  138mL OMNIPAQUE IOHEXOL 300 MG/ML  SOLN COMPARISON:  Partial comparison to CTA chest dated 08/22/2021 FINDINGS: Lower chest: Patchy left lower lobe opacity, atelectasis versus pneumonia, mildly improved. Scattered subcentimeter pulmonary metastases. Mediastinal nodal metastases. Left chest wall, left breast/axilla, and right posterior back soft tissue metastases. Index lesion in the left anterior chest wall measures 6.7 cm. Hepatobiliary: Innumerable hepatic metastases throughout both lobes. Index lesion in segment 5 measures 4.4 cm. Status post cholecystectomy. No intrahepatic or extrahepatic duct dilatation. Pancreas: Multiple pancreatic masses, including in the pancreatic tail, measure up to 2.7 cm. Spleen: Within normal limits. Adrenals/Urinary Tract: Bilateral adrenal glands are within normal limits. Suspected bilateral renal metastases, measuring up to 16 mm in the right upper kidney (series 2/image 32). 6.0 cm right lower pole renal cyst, benign. No hydronephrosis. Bladder is within normal limits. Stomach/Bowel: Stomach is within normal limits. No evidence of bowel obstruction. Normal appendix (series 2/image 64). Status post left hemicolectomy. No colonic wall thickening or mass is evident on CT. Vascular/Lymphatic: No evidence of abdominal aortic aneurysm. No suspicious  abdominopelvic lymphadenopathy. Reproductive: Status post hysterectomy. Bilateral ovaries are not discretely visualized. Other: No abdominopelvic ascites. 18 mm soft tissue implant in the left lower pelvis (series 2/image 81), possibly reflecting a peritoneal implant. Musculoskeletal: Multiple soft tissue implants in the subcutaneous tissues, measuring up to 16 mm. 5.4 cm right rectus sheath implant. 4.2 cm implant along the left obturator externus musculature. Additional intramuscular implants in the right thigh (series 2/images 91 and 94). Left chest wall lesion (described above) involves the left anterior 6th rib. Otherwise, no focal osseous lesions. IMPRESSION: Widespread pulmonary, mediastinal, and left chest wall/breast metastases. Widespread hepatic, pancreatic, and renal metastases. Widespread intramuscular, subcutaneous, and possibly peritoneal soft tissue metastases. Electronically Signed   By: Julian Hy M.D.   On: 09/20/2021 21:19      [place]  If 7PM-7AM, please contact night-coverage [email or web address] 09/21/2021, 12:18 PM

## 2021-09-21 NOTE — Progress Notes (Addendum)
Subjective: Denies abdominal pain. Normally has constipation which is resolved with Senokot, denies noticing blood in stool or black stools.  Objective: Vital signs in last 24 hours: Temp:  [97.6 F (36.4 C)-97.8 F (36.6 C)] 97.8 F (36.6 C) (07/28 2036) Pulse Rate:  [84-95] 93 (07/29 1024) Resp:  [19-20] 19 (07/28 2036) BP: (110-127)/(61-74) 118/61 (07/29 1024) SpO2:  [96 %-98 %] 96 % (07/28 2036) Weight change:  Last BM Date : 09/20/21  PE: Sitting comfortably on bed GENERAL: Alert, awake, oriented x3, not in distress  ABDOMEN: Soft, nondistended, nontender abdomen EXTREMITIES: Swollen lower extremities, mild pitting edema  Lab Results: No results found for this or any previous visit (from the past 48 hour(s)).  Studies/Results: CT ABDOMEN PELVIS W CONTRAST  Result Date: 09/20/2021 CLINICAL DATA:  Metastatic adenocarcinoma. Known brain metastases (stage IV). Additional metastatic disease evaluation. EXAM: CT ABDOMEN AND PELVIS WITH CONTRAST TECHNIQUE: Multidetector CT imaging of the abdomen and pelvis was performed using the standard protocol following bolus administration of intravenous contrast. RADIATION DOSE REDUCTION: This exam was performed according to the departmental dose-optimization program which includes automated exposure control, adjustment of the mA and/or kV according to patient size and/or use of iterative reconstruction technique. CONTRAST:  147m OMNIPAQUE IOHEXOL 300 MG/ML  SOLN COMPARISON:  Partial comparison to CTA chest dated 08/22/2021 FINDINGS: Lower chest: Patchy left lower lobe opacity, atelectasis versus pneumonia, mildly improved. Scattered subcentimeter pulmonary metastases. Mediastinal nodal metastases. Left chest wall, left breast/axilla, and right posterior back soft tissue metastases. Index lesion in the left anterior chest wall measures 6.7 cm. Hepatobiliary: Innumerable hepatic metastases throughout both lobes. Index lesion in segment 5 measures 4.4  cm. Status post cholecystectomy. No intrahepatic or extrahepatic duct dilatation. Pancreas: Multiple pancreatic masses, including in the pancreatic tail, measure up to 2.7 cm. Spleen: Within normal limits. Adrenals/Urinary Tract: Bilateral adrenal glands are within normal limits. Suspected bilateral renal metastases, measuring up to 16 mm in the right upper kidney (series 2/image 32). 6.0 cm right lower pole renal cyst, benign. No hydronephrosis. Bladder is within normal limits. Stomach/Bowel: Stomach is within normal limits. No evidence of bowel obstruction. Normal appendix (series 2/image 64). Status post left hemicolectomy. No colonic wall thickening or mass is evident on CT. Vascular/Lymphatic: No evidence of abdominal aortic aneurysm. No suspicious abdominopelvic lymphadenopathy. Reproductive: Status post hysterectomy. Bilateral ovaries are not discretely visualized. Other: No abdominopelvic ascites. 18 mm soft tissue implant in the left lower pelvis (series 2/image 81), possibly reflecting a peritoneal implant. Musculoskeletal: Multiple soft tissue implants in the subcutaneous tissues, measuring up to 16 mm. 5.4 cm right rectus sheath implant. 4.2 cm implant along the left obturator externus musculature. Additional intramuscular implants in the right thigh (series 2/images 91 and 94). Left chest wall lesion (described above) involves the left anterior 6th rib. Otherwise, no focal osseous lesions. IMPRESSION: Widespread pulmonary, mediastinal, and left chest wall/breast metastases. Widespread hepatic, pancreatic, and renal metastases. Widespread intramuscular, subcutaneous, and possibly peritoneal soft tissue metastases. Electronically Signed   By: SJulian HyM.D.   On: 09/20/2021 21:19    Medications: I have reviewed the patient's current medications.  Assessment: Widespread hepatic, pancreatic, pulmonary, mediastinal, left chest wall/breast metastases, renal metastases, widespread intramuscular,  subcutaneous and possibly periduodenal soft tissue metastases  Dr. MJulien Nordmannfrom oncology strongly recommends evaluation of gastrointestinal tract  History of non-small lung cancer, status post right upper lobectomy Recent bilateral PE diagnosed on 08/22/2021, on Eliquis, last dose today  WBC 14.5, hemoglobin 9.4, platelet 436 Elevated alk phos  533, AST 57, elevated BUN 22  Plan: Patient received Eliquis today. Cannot perform EGD or colonoscopy tomorrow. Discussed with Dr.Vann, if we are to proceed with EGD and colonoscopy on Monday, she will need to hold Eliquis from now, start heparin drip. We check on patient again tomorrow and plan both procedures on Monday with Eliquis on hold at least for 24 hours and heparin for at least 6 hours.   Ronnette Juniper, MD 09/21/2021, 11:18 AM

## 2021-09-22 DIAGNOSIS — R0789 Other chest pain: Secondary | ICD-10-CM | POA: Diagnosis not present

## 2021-09-22 LAB — BASIC METABOLIC PANEL
Anion gap: 13 (ref 5–15)
BUN: 30 mg/dL — ABNORMAL HIGH (ref 6–20)
CO2: 24 mmol/L (ref 22–32)
Calcium: 9.2 mg/dL (ref 8.9–10.3)
Chloride: 102 mmol/L (ref 98–111)
Creatinine, Ser: 0.66 mg/dL (ref 0.44–1.00)
GFR, Estimated: >60 mL/min (ref >60)
Glucose, Bld: 97 mg/dL (ref 70–99)
Potassium: 4.6 mmol/L (ref 3.5–5.1)
Sodium: 139 mmol/L (ref 135–145)

## 2021-09-22 LAB — CBC
HCT: 26.1 % — ABNORMAL LOW (ref 36.0–46.0)
Hemoglobin: 8.4 g/dL — ABNORMAL LOW (ref 12.0–15.0)
MCH: 24.1 pg — ABNORMAL LOW (ref 26.0–34.0)
MCHC: 32.2 g/dL (ref 30.0–36.0)
MCV: 74.8 fL — ABNORMAL LOW (ref 80.0–100.0)
Platelets: 276 10*3/uL (ref 150–400)
RBC: 3.49 MIL/uL — ABNORMAL LOW (ref 3.87–5.11)
RDW: 18.7 % — ABNORMAL HIGH (ref 11.5–15.5)
WBC: 14.7 10*3/uL — ABNORMAL HIGH (ref 4.0–10.5)
nRBC: 0.2 % (ref 0.0–0.2)

## 2021-09-22 LAB — HEPARIN LEVEL (UNFRACTIONATED): Heparin Unfractionated: >1.1 IU/mL — ABNORMAL HIGH (ref 0.30–0.70)

## 2021-09-22 LAB — APTT
aPTT: 137 seconds — ABNORMAL HIGH (ref 24–36)
aPTT: 109 seconds — ABNORMAL HIGH (ref 24–36)
aPTT: 117 seconds — ABNORMAL HIGH (ref 24–36)

## 2021-09-22 MED ORDER — HEPARIN (PORCINE) 25000 UT/250ML-% IV SOLN
700.0000 [IU]/h | INTRAVENOUS | Status: AC
Start: 2021-09-22 — End: 2021-09-23
  Administered 2021-09-22: 900 [IU]/h via INTRAVENOUS
  Administered 2021-09-22: 1100 [IU]/h via INTRAVENOUS
  Filled 2021-09-22: qty 250

## 2021-09-22 MED ORDER — FENTANYL 100 MCG/HR TD PT72
1.0000 | MEDICATED_PATCH | TRANSDERMAL | Status: DC
  Administered 2021-09-22 – 2021-10-04 (×5): 1 via TRANSDERMAL
  Filled 2021-09-22 (×5): qty 1

## 2021-09-22 MED ORDER — HEPARIN (PORCINE) 25000 UT/250ML-% IV SOLN
1100.0000 [IU]/h | INTRAVENOUS | Status: DC
  Administered 2021-09-22: 1100 [IU]/h via INTRAVENOUS

## 2021-09-22 NOTE — Progress Notes (Signed)
ANTICOAGULATION CONSULT NOTE -follow up  Pharmacy Consult for heparin Indication: pulmonary embolus  Allergies  Allergen Reactions   Fish Allergy Hives   Penicillins Hives   Augmentin [Amoxicillin-Pot Clavulanate] Hives   Betadine [Povidone Iodine] Hives and Other (See Comments)    Bad hives, per pt   Shellfish-Derived Products Hives   Tetanus Toxoids Hives    Patient Measurements: Height: 5\' 9"  (175.3 cm) Weight: 83.9 kg (185 lb) IBW/kg (Calculated) : 66.2 Heparin Dosing Weight: 83 kg  Vital Signs: Temp: 98.5 F (36.9 C) (07/30 0120) Temp Source: Oral (07/30 0120) BP: 132/76 (07/30 0120) Pulse Rate: 95 (07/30 0120)  Labs: Recent Labs    09/21/21 1454 09/22/21 0404  HGB 8.5* 8.4*  HCT 26.8* 26.1*  PLT 294 276  APTT 40* 137*  LABPROT 20.3*  --   INR 1.8*  --   HEPARINUNFRC  --  >1.10*  CREATININE  --  0.66    Estimated Creatinine Clearance: 88.7 mL/min (by C-G formula based on SCr of 0.66 mg/dL).   Medical History: Past Medical History:  Diagnosis Date   Allergy    Arthritis    Complication of anesthesia    No pertinent past medical history    PONV (postoperative nausea and vomiting)    nausea with dizziness      Assessment: 56 year old female admitted with chest pain. Patient recently hospitalized 6/28-7/3 for respiratory failure in the setting of acute bilateral PE. Patient was started on Eliquis. Patient has been on Eliquis since admission, now with need for EGD/colonoscopy and will need to be off Eliquis. Plan for heparin infusion while Eliquis on hold - current plan for procedures on Monday. Per GI, will need to hold heparin 6 hours prior to start of procedure.  Last dose of Eliquis 7/29 ~ 1030.  09/22/2021 aPTT 137 supra-therapeutic on 1350 units/hr HL > 1.1 as expected Hgb 8.4, plts WNL Per RN no bleeding  Goal of Therapy:  Heparin level 0.3-0.7 units/ml aPTT 66-102 seconds Monitor platelets by anticoagulation protocol: Yes   Plan:   Hold heparin infusion x 1 hour then resume heparin at a rate of 1100 units/hr -Check aPTT in 6 hours - will follow aPTT until correlation with HL -Daily CBC and HL -GI - please specify time for heparin to be held Monday morning once schedule known  Dolly Rias RPh 09/22/2021, 5:01 AM

## 2021-09-22 NOTE — Progress Notes (Signed)
Subjective: Patient is started her bowel prep. Denies nausea, vomiting or abdominal pain.  Objective: Vital signs in last 24 hours: Temp:  [97.4 F (36.3 C)-98.5 F (36.9 C)] 98.1 F (36.7 C) (07/30 0927) Pulse Rate:  [70-95] 85 (07/30 0927) Resp:  [14-18] 14 (07/30 0927) BP: (103-133)/(57-83) 108/57 (07/30 0927) SpO2:  [99 %-100 %] 100 % (07/30 0927) Weight change:  Last BM Date : 09/20/21  PE: Sitting comfortably on bedside chair GENERAL: Able to speak in full sentences, not in respiratory distress, on room air  ABDOMEN: Soft, nondistended, nontender sounds EXTREMITIES: Bipedal pitting edema  Lab Results: Results for orders placed or performed during the hospital encounter of 09/01/2021 (from the past 48 hour(s))  APTT     Status: Abnormal   Collection Time: 09/21/21  2:54 PM  Result Value Ref Range   aPTT 40 (H) 24 - 36 seconds    Comment:        IF BASELINE aPTT IS ELEVATED, SUGGEST PATIENT RISK ASSESSMENT BE USED TO DETERMINE APPROPRIATE ANTICOAGULANT THERAPY. Performed at [facility], [place] [address] [place], [place] [address]   Protime-INR     Status: Abnormal   Collection Time: 09/21/21  2:54 PM  Result Value Ref Range   Prothrombin Time 20.3 (H) 11.4 - 15.2 seconds   INR 1.8 (H) 0.8 - 1.2    Comment: (NOTE) INR goal varies based on device and disease states. Performed at [facility], [place] [address] [place], [place] [address]   CBC     Status: Abnormal   Collection Time: 09/21/21  2:54 PM  Result Value Ref Range   WBC 14.6 (H) 4.0 - 10.5 K/uL   RBC 3.55 (L) 3.87 - 5.11 MIL/uL   Hemoglobin 8.5 (L) 12.0 - 15.0 g/dL    Comment: Reticulocyte Hemoglobin testing may be clinically indicated, consider ordering this additional test [id number]    HCT 26.8 (L) 36.0 - 46.0 %   MCV 75.5 (L) 80.0 - 100.0 fL   MCH 23.9 (L) 26.0 - 34.0 pg   MCHC 31.7 30.0 - 36.0 g/dL   RDW 18.6 (H) 11.5 - 15.5 %   Platelets 294 150 - 400 K/uL    nRBC 0.3 (H) 0.0 - 0.2 %    Comment: Performed at [facility], [place] [address], [place], [place] [address]  CBC     Status: Abnormal   Collection Time: 09/22/21  4:04 AM  Result Value Ref Range   WBC 14.7 (H) 4.0 - 10.5 K/uL   RBC 3.49 (L) 3.87 - 5.11 MIL/uL   Hemoglobin 8.4 (L) 12.0 - 15.0 g/dL    Comment: Reticulocyte Hemoglobin testing may be clinically indicated, consider ordering this additional test [id number]    HCT 26.1 (L) 36.0 - 46.0 %   MCV 74.8 (L) 80.0 - 100.0 fL   MCH 24.1 (L) 26.0 - 34.0 pg   MCHC 32.2 30.0 - 36.0 g/dL   RDW 18.7 (H) 11.5 - 15.5 %   Platelets 276 150 - 400 K/uL   nRBC 0.2 0.0 - 0.2 %    Comment: Performed at [facility], [place] [address], [place],  [address]  Basic metabolic panel     Status: Abnormal   Collection Time: 09/22/21  4:04 AM  Result Value Ref Range   Sodium 139 135 - 145 mmol/L   Potassium 4.6 3.5 - 5.1 mmol/L   Chloride 102 98 - 111 mmol/L   CO2 24 22 -  32 mmol/L   Glucose, Bld 97 70 - 99 mg/dL    Comment: Glucose reference range applies only to samples taken after fasting for at least 8 hours.   BUN 30 (H) 6 - 20 mg/dL   Creatinine, Ser 0.66 0.44 - 1.00 mg/dL   Calcium 9.2 8.9 - 10.3 mg/dL   GFR, Estimated >60 >60 mL/min    Comment: (NOTE) Calculated using the CKD-EPI Creatinine Equation (2021)    Anion gap 13 5 - 15    Comment: Performed at [facility], [place] [address] [place], [place] [address]  Heparin level (unfractionated)     Status: Abnormal   Collection Time: 09/22/21  4:04 AM  Result Value Ref Range   Heparin Unfractionated >1.10 (H) 0.30 - 0.70 IU/mL    Comment: (NOTE) The clinical reportable range upper limit is being lowered to >1.10 to align with the FDA approved guidance for the current laboratory assay.  If heparin results are below expected values, and patient dosage has  been confirmed, suggest follow up testing of antithrombin III  levels. Performed at [facility], [place] [address], [place], [place] [address]   APTT     Status: Abnormal   Collection Time: 09/22/21  4:04 AM  Result Value Ref Range   aPTT 137 (H) 24 - 36 seconds    Comment:        IF BASELINE aPTT IS ELEVATED, SUGGEST PATIENT RISK ASSESSMENT BE USED TO DETERMINE APPROPRIATE ANTICOAGULANT THERAPY. 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 Additional metastatic disease evaluation. EXAM: CT ABDOMEN AND PELVIS WITH CONTRAST TECHNIQUE: Multidetector CT imaging of the abdomen and pelvis was performed using the standard protocol following bolus administration of intravenous contrast. RADIATION DOSE REDUCTION: This exam was performed according to the departmental dose-optimization program which includes automated exposure control, adjustment of the mA and/or kV according to patient size and/or use of iterative reconstruction technique. CONTRAST:  120mL OMNIPAQUE IOHEXOL 300 MG/ML  SOLN COMPARISON:  Partial comparison to CTA chest dated 08/22/2021 FINDINGS: Lower chest: Patchy left lower lobe opacity, atelectasis versus pneumonia, mildly improved. Scattered subcentimeter pulmonary metastases. Mediastinal nodal metastases. Left chest wall, left breast/axilla, and right posterior back soft tissue metastases. Index lesion in the left anterior chest wall measures 6.7 cm. Hepatobiliary: Innumerable hepatic metastases throughout both lobes. Index lesion in segment 5 measures 4.4 cm. Status post cholecystectomy. No intrahepatic or extrahepatic duct dilatation. Pancreas: Multiple pancreatic masses, including in the pancreatic tail, measure up to 2.7 cm. Spleen: Within normal limits. Adrenals/Urinary Tract: Bilateral adrenal glands are within normal limits.  Suspected bilateral renal metastases, measuring up to 16 mm in the right upper kidney (series 2/image 32). 6.0 cm right lower pole renal cyst, benign. No hydronephrosis. Bladder is within normal limits. Stomach/Bowel: Stomach is within normal limits. No evidence of bowel obstruction. Normal appendix (series 2/image 64). Status post left hemicolectomy. No colonic wall thickening or mass is evident on CT. Vascular/Lymphatic: No evidence of abdominal aortic aneurysm. No suspicious abdominopelvic lymphadenopathy. Reproductive: Status post hysterectomy. Bilateral ovaries are not discretely visualized. Other: No abdominopelvic ascites. 18 mm soft tissue implant in the left lower pelvis (series 2/image 81), possibly reflecting a peritoneal implant. Musculoskeletal: Multiple soft tissue implants in the subcutaneous tissues, measuring up to 16 mm. 5.4 cm right rectus sheath implant. 4.2 cm  implant along the left obturator externus musculature. Additional intramuscular implants in the right thigh (series 2/images 91 and 94). Left chest wall lesion (described above) involves the left anterior 6th rib. Otherwise, no focal osseous lesions. IMPRESSION: Widespread pulmonary, mediastinal, and left chest wall/breast metastases. Widespread hepatic, pancreatic, and renal metastases. Widespread intramuscular, subcutaneous, and possibly peritoneal soft tissue metastases. Electronically Signed   By: Julian Hy M.D.   On: 09/20/2021 21:19    Medications: I have reviewed the patient's current medications.  Assessment: Widespread hepatic, pancreatic, pulmonary, mediastinal, left chest wall/breast, renal, intramuscular, subcutaneous and possible periduodenal soft tissue metastases  As per oncology recommend evaluation of GI tract  Last dose of Eliquis 09/21/21, currently on heparin for PE  Plan: On clear liquid diet, has started colonic prep, currently scheduled for EGD and colonoscopy at 11 AM tomorrow with Dr.  Alessandra Bevels. We will hold heparin 5 AM tomorrow.  Ronnette Juniper, MD 09/22/2021, 10:16 AM

## 2021-09-22 NOTE — Progress Notes (Addendum)
ANTICOAGULATION CONSULT NOTE -follow up  Pharmacy Consult for heparin Indication: pulmonary embolus  Allergies  Allergen Reactions   Fish Allergy Hives   Penicillins Hives   Augmentin [Amoxicillin-Pot Clavulanate] Hives   Betadine [Povidone Iodine] Hives and Other (See Comments)    Bad hives, per pt   Shellfish-Derived Products Hives   Tetanus Toxoids Hives    Patient Measurements: Height: 5\' 9"  (175.3 cm) Weight: 83.9 kg (185 lb) IBW/kg (Calculated) : 66.2 Heparin Dosing Weight: 83 kg  Vital Signs: Temp: 98.7 F (37.1 C) (07/30 1331) Temp Source: Oral (07/30 1331) BP: 118/69 (07/30 1331) Pulse Rate: 87 (07/30 1331)  Labs: Recent Labs    09/21/21 1454 09/22/21 0404 09/22/21 1258  HGB 8.5* 8.4*  --   HCT 26.8* 26.1*  --   PLT 294 276  --   APTT 40* 137* 109*  LABPROT 20.3*  --   --   INR 1.8*  --   --   HEPARINUNFRC  --  >1.10*  --   CREATININE  --  0.66  --      Estimated Creatinine Clearance: 88.7 mL/min (by C-G formula based on SCr of 0.66 mg/dL).   Medical History: Past Medical History:  Diagnosis Date   Allergy    Arthritis    Complication of anesthesia    No pertinent past medical history    PONV (postoperative nausea and vomiting)    nausea with dizziness      Assessment: 56 year old female admitted with chest pain. Patient recently hospitalized 6/28-7/3 for respiratory failure in the setting of acute bilateral PE. Patient was started on Eliquis. Patient has been on Eliquis since admission, now with need for EGD/colonoscopy and will need to be off Eliquis. Plan for heparin infusion while Eliquis on hold - current plan for procedures on Monday. Per GI, will need to hold heparin 6 hours prior to start of procedure.  Last dose of Eliquis 7/29 ~ 1030.  09/22/2021 -aPTT 109 supra-therapeutic on 1100 units/hr -HL > 1.1 as expected in setting of recent Eliquis use -Hgb 8.4, plts WNL -Per RN no bleeding or infusion issues  Goal of Therapy:   Heparin level 0.3-0.7 units/ml aPTT 66-102 seconds Monitor platelets by anticoagulation protocol: Yes   Plan:  -Decrease heparin infusion to 900 units/hr -Check aPTT 2100 - will follow aPTT until correlation with HL -Daily CBC and HL -Heparin to be held at 0500 7/31 am for procedure  Tawnya Crook, PharmD, BCPS Clinical Pharmacist 09/22/2021 2:42 PM

## 2021-09-22 NOTE — Progress Notes (Signed)
Daily Progress Note   Patient Name: [name]       Date: 09/22/2021 DOB: [birth year]  Age: 56 y.o. MRN#: [id number] Attending Physician: Geradine Girt, DO Primary Care Physician: Pcp, No Admit Date: 09/20/2021  Reason for Consultation/Follow-up: Pain control  Subjective: Patient is sitting up in chair, GI following, plans for endoscopy soon. Pain management options discussed.        Length of Stay: 18  Current Medications: Scheduled Meds:   celecoxib  200 mg Oral BID   dexamethasone (DECADRON) injection  8 mg Intravenous Q24H   famotidine  20 mg Oral QHS   fentaNYL  1 patch Transdermal Q72H   gabapentin  200 mg Oral TID   latanoprost  1 drop Both Eyes QHS   leptospermum manuka honey  1 Application Topical Daily   lidocaine  1 patch Transdermal Q24H   metoprolol tartrate  50 mg Oral BID   senna  1 tablet Oral QHS   sodium chloride flush  3 mL Intravenous Q12H   traZODone  100 mg Oral QHS    Continuous Infusions:  sodium chloride 20 mL/hr at 09/21/21 1542   heparin 1,100 Units/hr (09/22/21 1137)    PRN Meds: acetaminophen **OR** acetaminophen, HYDROmorphone (DILAUDID) injection, HYDROmorphone, magnesium hydroxide, ondansetron **OR** ondansetron (ZOFRAN) IV, mouth rinse, polyethylene glycol, senna-docusate  Physical Exam         Awake alert Resting in bed No distress Regular work of breathing S 1 S 2 Abdomen not distended  Vital Signs: BP 118/69 (BP Location: Left Arm)   Pulse 87   Temp 98.7 F (37.1 C) (Oral)   Resp 14   Ht 5\' 9"  (1.753 m)   Wt 83.9 kg   SpO2 100%   BMI 27.32 kg/m  SpO2: SpO2: 100 % O2 Device: O2 Device: Room Air O2 Flow Rate: O2 Flow Rate (L/min): 2 L/min  Intake/output summary:  Intake/Output Summary (Last 24 hours) at 09/22/2021  1344 Last data filed at 09/22/2021 0706 Gross per 24 hour  Intake 859.91 ml  Output 0 ml  Net 859.91 ml    LBM: Last BM Date : 09/20/21 Baseline Weight: Weight: 83.9 kg Most recent weight: Weight: 83.9 kg       Palliative Assessment/Data:      Patient Active Problem List   Diagnosis Date  Noted   Pressure injury of skin 09/20/2021   Metastasis to brain ([place]) 09/18/2021   Sinus tachycardia 09/12/2021   Anemia of chronic disease 09/11/2021   Bandemia 09/11/2021   Cancer related pain 09/10/2021   Lactic acidosis 09/10/2021   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[place]) 09/06/2021   Pain from bone metastases ([place]) 09/06/2021   Chest wall pain 09/04/2021   Metastatic adenocarcinoma ([place]) 09/04/2021   Intractable pain 09/04/2021   Pulmonary embolism ([place]) 08/22/2021   Acute pulmonary embolism, unspecified pulmonary embolism type, unspecified whether acute cor pulmonale present ([place]) 08/22/2021   Primary cancer of right upper lobe of lung ([place]) 07/08/2018   Urinary incontinence 04/05/2011    Palliative Care Assessment & Plan   Patient Profile:    Assessment:  56 year old F with PMH of 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  Patient was discharged on 2 L by [place] last hospitalization.    Radiation oncology consulted, and patient started radiation treatment on 7/17.  Pain control remains barriers to discharge.  Oncology and palliative medicine consulted.    Patient underwent US-guided biopsy of LUE soft tissue mass on 7/21.  Pathology pending.  Palliative medicine following for pain management  Recommendations/Plan: Pain management recommendations discussed with patient after medication history was reviewed:  1. trans dermal Fentanyl 100 mcg  Q 72 hours.   2. D/C PO OxyContin,   in order to have the patient on only one long acting opioid, transdermal Fentanyl.   3.  Continue PO Dilaudid 2 mg Q 3 hours for moderate pain and 4 mg PO Q 3 hours for severe pain. Use first and use PRN IV Dilaudid for breakthrough pain.  Continue other adjuvants and monitor. Patient states that she needs IV Dilaudid for breakthouh pain in order to tolerate her radiation treatments. Continue to monitor her pain regimen. Radiation oncology input is being sought for her MRI brain findings.  GI following, endoscopy as part of oncology work up soon.     Goals of Care and Additional Recommendations: Limitations on Scope of Treatment: Full Scope Treatment  Code Status:    Code Status Orders  (From admission, onward)           Start     Ordered   09/04/21 0035  Full code  Continuous        09/04/21 0037           Code Status History     Date Active Date Inactive Code Status Order ID Comments User Context   08/22/2021 0743 08/27/2021 0129 Full Code [id number]  [name], MD Inpatient   08/22/2021 0309 08/22/2021 0743 Full Code [id number]  Howerter, Ethelda Chick, DO ED       Prognosis:  Unable to determine  Discharge Planning: To Be Determined  Care plan was discussed with  patient  RN.   Thank you for allowing the Palliative Medicine Team to assist in the care of this patient.  MOD MDM.     Greater than 50%  of this time was spent counseling and coordinating care related to the above assessment and plan.  Loistine Chance, MD  Please contact Palliative Medicine Team phone at [phone] for questions and concerns.

## 2021-09-22 NOTE — Progress Notes (Signed)
PROGRESS NOTE  [name] PIR:[id number] DOB: December 20, 1965   PCP: Pcp, No  Patient is from: Home  DOA: 09/15/2021 LOS: 28  Chief complaints Chief Complaint  Patient presents with   Chest Pain     Brief Narrative / Interim history: 56 year old F with PMH of stage Ib non-small lung cancer s/p RUL lobectomy in 2020 followed by adjuvant treatment, recent diagnosis of widespread metastatic adenocarcinoma with unclear primary, recent hospitalization from 6/28-7/3 for acute respiratory failure with hypoxia in the setting of acute PE and and cancer related pain returning with cancer related pain that was not controlled with home regimen, and admitted for the same.  Patient was discharged on 2 L by [place] last hospitalization.  Patient underwent US-guided biopsy of LUE soft tissue mass on 7/21.  Pathology showed adenocarcinoma of unclear primary.  MRI brain concerning for brain mets and extracranial soft tissue mets.  [facility] consulted per recommendation by oncology.  CT abdomen and pelvis ordered.  Palliative medicine following for pain management  Radiation oncology consulted, and patient started radiation treatment for spinal and rib metastasis on 7/17.  Pain control on 7/29 but now needs EGD/colonoscopy-- will need to be off eliquis.      Subjective: Pain not controlled in the evenings  Objective: Vitals:   09/21/21 2045 09/22/21 0120 09/22/21 0706 09/22/21 0927  BP: 133/72 132/76 103/67 (!) 108/57  Pulse: 83 95 80 85  Resp: 18 18 18 14   Temp: 97.9 F (36.6 C) 98.5 F (36.9 C) 97.9 F (36.6 C) 98.1 F (36.7 C)  TempSrc: Oral Oral Oral Oral  SpO2: 100% 100% 99% 100%  Weight:      Height:        Examination:   General: Appearance:     Overweight female in no acute distress     Lungs:     respirations unlabored  Heart:    Normal heart rate.  MS:   All extremities are intact.   Neurologic:   Awake, alert       Assessment and plan: Principal Problem:   Chest wall  pain Active Problems:   Pulmonary embolism (H[facility])   Metastatic adenocarcinoma (HCC)   Intractable pain   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HCC)   Pain from bone metastases (HCC)   Cancer related pain   Lactic acidosis   Anemia of chronic disease   Bandemia   Sinus tachycardia   Metastasis to brain (H[facility])   Pressure injury of skin    Metastatic adenocarcinoma with unclear primary: Had stage Ib non-small lung cancer treated with wedge resection with clear margins in [place] in 2020 followed by 3 years of adjuvant therapy.  Diagnosed with widespread mets in 06/2021.  Had liver biopsy in [place] in 6/23 that showed adenocarcinoma with immunoreactivity not typical for lung cancer.  CT angio chest showed large right suprahilar mass extending continuously into mediastinum and measuring about 6.3 cm maximal diameter with mets to brain, liver, tail of pancreas, left anterior fifth ribs and multiple vertebral bodies as well as the left breast tissue.  MRI brain concerning for brain mets -Started radiation to the spine and ribs on 7/17>> -Pathology from left arm tissue biopsy on 7/21 shows adenocarcinoma but unclear primary.  -[facility] consulted and recommended CT abdomen and pelvis-ordered. -now plan for EGD/colonoscopy on Monday  Cancer related pain:  -Appreciate help by palliative medicine-adjusting pain meds -Aggressive bowel regimen Currently seems to be controlled on current regimen but not in the evenings  Left arm swelling: reported on 7/13.  CT left humerus on 7/14 showed 3.7 cm left infraspinatus muscle, 6.2 cm left biceps muscle and other multiple soft tissue masses along the left chest wall and within the left breast soft tissues consistent with metastatic disease  Recent IH:[id number] PE diagnosed on 08/22/21.  She was discharged on 2 L by [place].  Now on room air. -heparin gtt until colonoscopy and EGD done then resume eliquis  Sinus tachycardia: Likely due to pain and underlying PE.   Resolved. -Continue metoprolol 50 mg twice daily  Anemia of chronic disease: Likely due to malignancy.  Physical deconditioning -Therapy recommended home health.   -Hospital bed, RW and 3 and 1 ordered.  Earwax in left ear: -Debrox eardrop for 3 days  Bandemia: Likely from malignancy and demargination. -Monitor     DVT prophylaxis:    Code Status: Full code Family Communication: None at bedside. Level of care: Med-Surg Status is: Inpatient Remains inpatient appropriate because: Intractable cancer pain   Final disposition: Likely home with HH and DME Consultants:  Radiation oncology Palliative medicine Interventional radiology  Sch Meds:  Scheduled Meds:  celecoxib  200 mg Oral BID   dexamethasone (DECADRON) injection  8 mg Intravenous Q24H   famotidine  20 mg Oral QHS   fentaNYL  1 patch Transdermal Q72H   gabapentin  200 mg Oral TID   latanoprost  1 drop Both Eyes QHS   leptospermum manuka honey  1 Application Topical Daily   lidocaine  1 patch Transdermal Q24H   metoprolol tartrate  50 mg Oral BID   oxyCODONE  20 mg Oral Q12H   senna  1 tablet Oral QHS   sodium chloride flush  3 mL Intravenous Q12H   traZODone  100 mg Oral QHS   Continuous Infusions:  sodium chloride 20 mL/hr at 09/21/21 1542   heparin 1,100 Units/hr (09/22/21 1137)   PRN Meds:.acetaminophen **OR** acetaminophen, HYDROmorphone (DILAUDID) injection, HYDROmorphone, magnesium hydroxide, ondansetron **OR** ondansetron (ZOFRAN) IV, mouth rinse, polyethylene glycol, senna-docusate  Antimicrobials: Anti-infectives (From admission, onward)    None        I have personally reviewed the following labs and images: CBC: Recent Labs  Lab 09/21/21 1454 09/22/21 0404  WBC 14.6* 14.7*  HGB 8.5* 8.4*  HCT 26.8* 26.1*  MCV 75.5* 74.8*  PLT 294 276    BMP &GFR Recent Labs  Lab 09/22/21 0404  NA 139  K 4.6  CL 102  CO2 24  GLUCOSE 97  BUN 30*  CREATININE 0.66  CALCIUM 9.2     Estimated Creatinine Clearance: 88.7 mL/min (by C-G formula based on SCr of 0.66 mg/dL). Liver & Pancreas: No results for input(s): "AST", "ALT", "ALKPHOS", "BILITOT", "PROT", "ALBUMIN" in the last 168 hours.  No results for input(s): "LIPASE", "AMYLASE" in the last 168 hours. No results for input(s): "AMMONIA" in the last 168 hours. Diabetic: No results for input(s): "HGBA1C" in the last 72 hours. No results for input(s): "GLUCAP" in the last 168 hours. Cardiac Enzymes: No results for input(s): "CKTOTAL", "CKMB", "CKMBINDEX", "TROPONINI" in the last 168 hours.  No results for input(s): "PROBNP" in the last 8760 hours. Coagulation Profile: Recent Labs  Lab 09/21/21 1454  INR 1.8*   Thyroid Function Tests: No results for input(s): "TSH", "T4TOTAL", "FREET4", "T3FREE", "THYROIDAB" in the last 72 hours.  Lipid Profile: No results for input(s): "CHOL", "HDL", "LDLCALC", "TRIG", "CHOLHDL", "LDLDIRECT" in the last 72 hours. Anemia Panel: No results for input(s): "VITAMINB12", "FOLATE", "FERRITIN", "TIBC", "  IRON", "RETICCTPCT" in the last 72 hours. Urine analysis:    Component Value Date/Time   COLORURINE YELLOW 02/14/2010 0600   APPEARANCEUR CLEAR 02/14/2010 0600   LABSPEC 1.028 02/14/2010 0600   PHURINE 5.5 02/14/2010 0600   GLUCOSEU NEGATIVE 02/14/2010 0600   HGBUR NEGATIVE 02/14/2010 0600   BILIRUBINUR LARGE (A) 02/14/2010 0600   KETONESUR NEGATIVE 02/14/2010 0600   PROTEINUR NEGATIVE 02/14/2010 0600   UROBILINOGEN 1.0 02/14/2010 0600   NITRITE NEGATIVE 02/14/2010 0600   LEUKOCYTESUR  02/14/2010 0600    NEGATIVE MICROSCOPIC NOT DONE ON URINES WITH NEGATIVE PROTEIN, BLOOD, LEUKOCYTES, NITRITE, OR GLUCOSE <1000 mg/dL.   Sepsis Labs: Invalid input(s): "PROCALCITONIN", "LACTICIDVEN"  Microbiology: No results found for this or any previous visit (from the past 240 hour(s)).  Radiology Studies: No results found.    Eulogio Bear DO [facility]  If 7PM-7AM,  please contact night-coverage [email or web address] 09/22/2021, 12:43 PM

## 2021-09-22 NOTE — Anesthesia Preprocedure Evaluation (Signed)
Anesthesia Evaluation  Patient identified by MRN, date of birth, ID band Patient awake    Reviewed: Allergy & Precautions, NPO status , Patient's Chart, lab work & pertinent test results  History of Anesthesia Complications (+) PONV and history of anesthetic complications  Airway Mallampati: II  TM Distance: >3 FB Neck ROM: Full    Dental no notable dental hx. (+) Missing, Partial Upper, Dental Advisory Given,    Pulmonary PE (dx 08/26/21 on Heparin Bridge) stage Ib non-small lung cancer s/p RUL lobectomy in 2020 followed by adjuvant treatment   Pulmonary exam normal breath sounds clear to auscultation       Cardiovascular Exercise Tolerance: Good Normal cardiovascular exam Rhythm:Regular Rate:Normal     Neuro/Psych negative neurological ROS     GI/Hepatic negative GI ROS, Neg liver ROS,   Endo/Other    Renal/GU      Musculoskeletal  (+) Arthritis ,   Abdominal   Peds  Hematology  (+) Blood dyscrasia, anemia , Lab Results      Component                Value               Date                      WBC                      14.7 (H)            09/22/2021                HGB                      8.4 (L)             09/22/2021                HCT                      26.1 (L)            09/22/2021                   PLT                      276                 09/22/2021              Anesthesia Other Findings   Reproductive/Obstetrics                            Anesthesia Physical Anesthesia Plan  ASA: 4  Anesthesia Plan: MAC   Post-op Pain Management:    Induction: Intravenous  PONV Risk Score and Plan: Treatment may vary due to age or medical condition  Airway Management Planned:   Additional Equipment: None  Intra-op Plan:   Post-operative Plan:   Informed Consent: I have reviewed the patients History and Physical, chart, labs and discussed the procedure including the risks,  benefits and alternatives for the proposed anesthesia with the patient or authorized representative who has indicated his/her understanding and acceptance.     Dental advisory given  Plan Discussed with:   Anesthesia Plan Comments: (Metastatic adenocarcinoma with unclear primary for EGD Colon)       Anesthesia Quick Evaluation

## 2021-09-22 NOTE — Progress Notes (Signed)
ANTICOAGULATION CONSULT NOTE -follow up  Pharmacy Consult for heparin Indication: pulmonary embolus  Allergies  Allergen Reactions   Fish Allergy Hives   Penicillins Hives   Augmentin [Amoxicillin-Pot Clavulanate] Hives   Betadine [Povidone Iodine] Hives and Other (See Comments)    Bad hives, per pt   Shellfish-Derived Products Hives   Tetanus Toxoids Hives    Patient Measurements: Height: 5\' 9"  (175.3 cm) Weight: 83.9 kg (185 lb) IBW/kg (Calculated) : 66.2 Heparin Dosing Weight: 83 kg  Vital Signs: Temp: 98.2 F (36.8 C) (07/30 2132) Temp Source: Oral (07/30 2132) BP: 121/69 (07/30 2132) Pulse Rate: 83 (07/30 2132)  Labs: Recent Labs    09/21/21 1454 09/22/21 0404 09/22/21 1258 09/22/21 2102  HGB 8.5* 8.4*  --   --   HCT 26.8* 26.1*  --   --   PLT 294 276  --   --   APTT 40* 137* 109* 117*  LABPROT 20.3*  --   --   --   INR 1.8*  --   --   --   HEPARINUNFRC  --  >1.10*  --   --   CREATININE  --  0.66  --   --      Estimated Creatinine Clearance: 88.7 mL/min (by C-G formula based on SCr of 0.66 mg/dL).   Medical History: Past Medical History:  Diagnosis Date   Allergy    Arthritis    Complication of anesthesia    No pertinent past medical history    PONV (postoperative nausea and vomiting)    nausea with dizziness      Assessment: 56 year old female admitted with chest pain. Patient recently hospitalized 6/28-7/3 for respiratory failure in the setting of acute bilateral PE. Patient was started on Eliquis. Patient has been on Eliquis since admission, now with need for EGD/colonoscopy and will need to be off Eliquis. Plan for heparin infusion while Eliquis on hold - current plan for procedures on Monday. Per GI, will need to hold heparin 6 hours prior to start of procedure.  Last dose of Eliquis 7/29 ~ 1030.  09/22/2021 -aPTT 117 supra-therapeutic on 900 units/hr -Per RN no bleeding or infusion issues  Goal of Therapy:  Heparin level 0.3-0.7  units/ml aPTT 66-102 seconds Monitor platelets by anticoagulation protocol: Yes   Plan:  -Decrease heparin infusion to 700 units/hr -Daily CBC and HL -Heparin to be held at 0500 7/31 am for procedure  Dolly Rias RPh 09/22/2021, 11:35 PM

## 2021-09-23 ENCOUNTER — Encounter (HOSPITAL_COMMUNITY): Payer: Self-pay | Admitting: Family Medicine

## 2021-09-23 ENCOUNTER — Ambulatory Visit
Admit: 2021-09-23 | Discharge: 2021-09-23 | Disposition: A | Discharge status: Home / Self Care | Payer: BLUE CROSS/BLUE SHIELD | Attending: Radiation Oncology | Admitting: Radiation Oncology

## 2021-09-23 ENCOUNTER — Ambulatory Visit: Admit: 2021-09-23 | Payer: BLUE CROSS/BLUE SHIELD

## 2021-09-23 DIAGNOSIS — R0789 Other chest pain: Secondary | ICD-10-CM | POA: Diagnosis not present

## 2021-09-23 LAB — CBC
HCT: 27.7 % — ABNORMAL LOW (ref 36.0–46.0)
Hemoglobin: 8.7 g/dL — ABNORMAL LOW (ref 12.0–15.0)
MCH: 24 pg — ABNORMAL LOW (ref 26.0–34.0)
MCHC: 31.4 g/dL (ref 30.0–36.0)
MCV: 76.5 fL — ABNORMAL LOW (ref 80.0–100.0)
Platelets: 237 10*3/uL (ref 150–400)
RBC: 3.62 MIL/uL — ABNORMAL LOW (ref 3.87–5.11)
RDW: 19.4 % — ABNORMAL HIGH (ref 11.5–15.5)
WBC: 15.3 10*3/uL — ABNORMAL HIGH (ref 4.0–10.5)
nRBC: 0.3 % — ABNORMAL HIGH (ref 0.0–0.2)

## 2021-09-23 LAB — APTT: aPTT: 61 seconds — ABNORMAL HIGH (ref 24–36)

## 2021-09-23 LAB — HEPARIN LEVEL (UNFRACTIONATED): Heparin Unfractionated: 0.71 IU/mL — ABNORMAL HIGH (ref 0.30–0.70)

## 2021-09-23 MED ORDER — HEPARIN (PORCINE) 25000 UT/250ML-% IV SOLN: 700.0000 [IU]/h | INTRAVENOUS | Status: DC

## 2021-09-23 MED ORDER — HEPARIN (PORCINE) 25000 UT/250ML-% IV SOLN: 800.0000 [IU]/h | INTRAVENOUS | Status: DC

## 2021-09-23 NOTE — Progress Notes (Signed)
Patient did not finish bowel prep solution for colonoscopy.  She finished a quarter of solution and said she could not tolerate anymore at midnight even with a lot of encouragement. This morning her stool is still brown and formed.  Will pass it on to next nurse.

## 2021-09-23 NOTE — Progress Notes (Addendum)
[facility] Gastroenterology Progress Note  [name] 56 y.o. 1965-07-03  CC: Widespread metastasis   Subjective: Patient was unable to finish her prep yesterday and is not having clear stools at this time.  Denies nausea, vomiting, abdominal pain  ROS : Review of Systems  Constitutional:  Negative for chills, fever and weight loss.  Gastrointestinal:  Negative for abdominal pain, blood in stool, constipation, diarrhea, heartburn, melena, nausea and vomiting.      Objective: Vital signs in last 24 hours: Vitals:   09/22/21 2132 09/23/21 0530  BP: 121/69 105/64  Pulse: 83 83  Resp: 18 18  Temp: 98.2 F (36.8 C) 98.3 F (36.8 C)  SpO2: 100% 99%    Physical Exam:  General:  Alert, cooperative, no distress, appears stated age  Head:  Normocephalic, without obvious abnormality, atraumatic  Eyes:  Anicteric sclera, EOM's intact  Lungs:   Clear to auscultation bilaterally, respirations unlabored  Heart:  Regular rate and rhythm, S1, S2 normal  Abdomen:   Soft, non-tender, bowel sounds active all four quadrants,  no masses,     Lab Results: Recent Labs    09/22/21 0404  NA 139  K 4.6  CL 102  CO2 24  GLUCOSE 97  BUN 30*  CREATININE 0.66  CALCIUM 9.2   No results for input(s): "AST", "ALT", "ALKPHOS", "BILITOT", "PROT", "ALBUMIN" in the last 72 hours. Recent Labs    09/22/21 0404 09/23/21 0420  WBC 14.7* 15.3*  HGB 8.4* 8.7*  HCT 26.1* 27.7*  MCV 74.8* 76.5*  PLT 276 237   Recent Labs    09/21/21 1454  LABPROT 20.3*  INR 1.8*      Assessment Widespread hepatic, pancreatic, pulmonary, mediastinal, left chest wall/breast, renal, intramuscular, subcutaneous and possible periduodenal soft tissue metastases   As per oncology recommend evaluation of GI tract   Last dose of Eliquis 09/21/21, currently on heparin for PE   Plan: We will have to cancel procedure today due to incomplete prep.  Continue to finish gallon of prep and remain on clear liquids.   We will reschedule to tomorrow August 1 at 2 PM.  May need additional gallon of prep as stools are not clear yet and she still has about a quarter of the prep left. Can resume heparin today N.p.o. 4 hours prior to procedure We will hold heparin at 8 AM tomorrow [facility] will follow  Garnette Scheuermann PA-C 09/23/2021, 9:07 AM  Contact #  [phone]

## 2021-09-23 NOTE — Progress Notes (Signed)
PROGRESS NOTE  [name] IOE:[id number] DOB: [birth year]   PCP: Pcp, No  Patient is from: Home  DOA: 09/17/2021 LOS: 5  Chief complaints Chief Complaint  Patient presents with   Chest Pain     Brief Narrative / Interim history: 56 year old F with PMH of stage Ib non-small lung cancer s/p RUL lobectomy in 2020 followed by adjuvant treatment, recent diagnosis of widespread metastatic adenocarcinoma with unclear primary, recent hospitalization from 6/28-7/3 for acute respiratory failure with hypoxia in the setting of acute PE and and cancer related pain returning with cancer related pain that was not controlled with home regimen, and admitted for the same.  Patient was discharged on 2 L by [place] last hospitalization.  Patient underwent US-guided biopsy of LUE soft tissue mass on 7/21.  Pathology showed adenocarcinoma of unclear primary.  MRI brain concerning for brain mets and extracranial soft tissue mets.  [facility] consulted per recommendation by oncology.  CT abdomen and pelvis ordered.  Palliative medicine following for pain management.  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  Pain control on 7/29 but now needs EGD/colonoscopy-- will need to be off eliquis.      Subjective: Up walking in the hallways, unable to have colonoscopy today due to poor prep  Objective: Vitals:   09/22/21 1331 09/22/21 1712 09/22/21 2132 09/23/21 0530  BP: 118/69 119/71 121/69 105/64  Pulse: 87 83 83 83  Resp: 14 16 18 18   Temp: 98.7 F (37.1 C) 98.3 F (36.8 C) 98.2 F (36.8 C) 98.3 F (36.8 C)  TempSrc: Oral Oral Oral Oral  SpO2: 100% 100% 100% 99%  Weight:      Height:        Examination:    General: Appearance:     Overweight female in no acute distress     Lungs:      respirations unlabored  Heart:    Normal heart rate.    MS:   All extremities are intact.   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  Diagnosed with widespread mets in 06/2021.  Had liver biopsy in [place] in 6/23 that showed adenocarcinoma with immunoreactivity not typical for lung cancer.  CT angio chest showed large right suprahilar mass extending continuously into mediastinum and measuring about 6.3 cm maximal diameter with mets to brain, liver, tail of pancreas, left anterior fifth ribs and multiple vertebral bodies as well as the left breast tissue.  MRI brain concerning for brain mets -Started radiation to the spine and ribs on 7/17>> -Pathology from left arm tissue biopsy on 7/21 shows adenocarcinoma but unclear primary.  -[facility] consulted and recommended CT abdomen and pelvis-ordered. -now plan for EGD/colonoscopy after prep complete-- 8/1?  Cancer related pain:  -Appreciate help by palliative medicine-adjusting pain meds -Aggressive bowel regimen  Currently seems to be controlled on current regimen but not in the evenings   Left arm swelling: reported on 7/13.  CT left humerus on 7/14 showed 3.7 cm left infraspinatus muscle, 6.2 cm left biceps muscle and other multiple soft tissue masses along the left chest wall and within the left breast soft tissues consistent with metastatic disease  Recent GY:[id number] PE diagnosed on 08/22/21.  She was discharged on 2 L by [place].  Now on room air. -heparin gtt until colonoscopy and EGD done then resume eliquis  Sinus  tachycardia: Likely due to pain and underlying PE.  Resolved. -Continue metoprolol 50 mg twice daily  Anemia of chronic disease: Likely due to malignancy.  Physical deconditioning -Therapy recommended home health.   -Hospital bed, RW and 3 and 1 ordered.  Earwax in left ear: -Debrox eardrop for 3 days  Bandemia: Likely from malignancy and demargination. -Monitor     DVT prophylaxis: heparin gtt   Code Status: Full code Family Communication: None at bedside. Level of care: Med-Surg Status is: Inpatient Remains inpatient appropriate because: need scopes   Final disposition: Likely home with [facility] and DME Consultants:  Radiation oncology Palliative medicine Interventional radiology GI  Sch Meds:  Scheduled Meds:  celecoxib  200 mg Oral BID   dexamethasone (DECADRON) injection  8 mg Intravenous Q24H   famotidine  20 mg Oral QHS   fentaNYL  1 patch Transdermal Q72H   gabapentin  200 mg Oral TID   latanoprost  1 drop Both Eyes QHS   leptospermum manuka honey  1 Application Topical Daily   lidocaine  1 patch Transdermal Q24H   metoprolol tartrate  50 mg Oral BID   senna  1 tablet Oral QHS   sodium chloride flush  3 mL Intravenous Q12H   traZODone  100 mg Oral QHS   Continuous Infusions:  sodium chloride 20 mL/hr at 09/21/21 1542   heparin 700 Units/hr (09/23/21 1009)   PRN Meds:.acetaminophen **OR** acetaminophen, HYDROmorphone (DILAUDID) injection, HYDROmorphone, magnesium hydroxide, ondansetron **OR** ondansetron (ZOFRAN) IV, mouth rinse, polyethylene glycol, senna-docusate  Antimicrobials: Anti-infectives (From admission, onward)    None        I have personally reviewed the following labs and images: CBC: Recent Labs  Lab 09/21/21 1454 09/22/21 0404 09/23/21 0420  WBC 14.6* 14.7* 15.3*  HGB 8.5* 8.4* 8.7*  HCT 26.8* 26.1* 27.7*  MCV 75.5* 74.8* 76.5*  PLT 294 276 237    BMP &GFR Recent Labs  Lab 09/22/21 0404  NA 139  K 4.6  CL 102  CO2  24  GLUCOSE 97  BUN 30*  CREATININE 0.66  CALCIUM 9.2    Estimated Creatinine Clearance: 88.7 mL/min (by C-G formula based on SCr of 0.66 mg/dL). Liver & Pancreas: No results for input(s): "AST", "ALT", "ALKPHOS", "BILITOT", "PROT", "ALBUMIN" in the last 168 hours.  No results for input(s): "LIPASE", "AMYLASE" in the last 168 hours. No results for input(s): "AMMONIA" in the last 168 hours. Diabetic: No results for input(s): "HGBA1C" in the last 72 hours. No results for input(s): "GLUCAP" in the last 168 hours. Cardiac Enzymes: No results for input(s): "CKTOTAL", "CKMB", "CKMBINDEX", "TROPONINI" in the last 168 hours.  No results for input(s): "PROBNP" in the last 8760 hours. Coagulation Profile: Recent Labs  Lab 09/21/21 1454  INR 1.8*   Thyroid Function Tests: No results for input(s): "TSH", "T4TOTAL", "FREET4", "T3FREE", "THYROIDAB" in the last 72 hours.  Lipid Profile: No results for input(s): "CHOL", "HDL", "LDLCALC", "TRIG", "CHOLHDL", "LDLDIRECT"  in the last 72 hours. Anemia Panel: No results for input(s): "VITAMINB12", "FOLATE", "FERRITIN", "TIBC", "IRON", "RETICCTPCT" in the last 72 hours. Urine analysis:    Component Value Date/Time   COLORURINE YELLOW 02/14/2010 0600   APPEARANCEUR CLEAR 02/14/2010 0600   LABSPEC 1.028 02/14/2010 0600   PHURINE 5.5 02/14/2010 0600   GLUCOSEU NEGATIVE 02/14/2010 0600   HGBUR NEGATIVE 02/14/2010 0600   BILIRUBINUR LARGE (A) 02/14/2010 0600   KETONESUR NEGATIVE 02/14/2010 0600   PROTEINUR NEGATIVE 02/14/2010 0600   UROBILINOGEN 1.0 02/14/2010 0600   NITRITE NEGATIVE 02/14/2010 0600   LEUKOCYTESUR  02/14/2010 0600    NEGATIVE MICROSCOPIC NOT DONE ON URINES WITH NEGATIVE PROTEIN, BLOOD, LEUKOCYTES, NITRITE, OR GLUCOSE <1000 mg/dL.   Sepsis Labs: Invalid input(s): "PROCALCITONIN", "LACTICIDVEN"  Microbiology: No results found for this or any previous visit (from the past 240 hour(s)).  Radiology Studies: No results  found.    Eulogio Bear DO [facility]  If 7PM-7AM, please contact night-coverage [email or web address] 09/23/2021, 11:10 AM

## 2021-09-23 NOTE — Progress Notes (Signed)
Daily Progress Note   Patient Name: [name]       Date: 09/23/2021 DOB: [birth year]  Age: 56 y.o. MRN#: [id number] Attending Physician: Geradine Girt, DO Primary Care Physician: Pcp, No Admit Date: 09/05/2021  Reason for Consultation/Follow-up: Pain control  Subjective: Patient is sitting up in chair, GI following, endoscopy could not be done because of inadequate oral prep, patient currently working on drinking her prep and EGD and colonoscopy scheduled for 10/01/2021.     Pain management options discussed.        Length of Stay: 19  Current Medications: Scheduled Meds:   celecoxib  200 mg Oral BID   dexamethasone (DECADRON) injection  8 mg Intravenous Q24H   famotidine  20 mg Oral QHS   fentaNYL  1 patch Transdermal Q72H   gabapentin  200 mg Oral TID   latanoprost  1 drop Both Eyes QHS   leptospermum manuka honey  1 Application Topical Daily   lidocaine  1 patch Transdermal Q24H   metoprolol tartrate  50 mg Oral BID   senna  1 tablet Oral QHS   sodium chloride flush  3 mL Intravenous Q12H   traZODone  100 mg Oral QHS    Continuous Infusions:  sodium chloride 20 mL/hr at 09/21/21 1542   heparin 700 Units/hr (09/23/21 1009)    PRN Meds: acetaminophen **OR** acetaminophen, HYDROmorphone (DILAUDID) injection, HYDROmorphone, magnesium hydroxide, ondansetron **OR** ondansetron (ZOFRAN) IV, mouth rinse, polyethylene glycol, senna-docusate  Physical Exam         Awake alert Resting in chair No distress Regular work of breathing S 1 S 2 Abdomen not distended  Vital Signs: BP 105/64 (BP Location: Left Arm)   Pulse 83   Temp 98.3 F (36.8 C) (Oral)   Resp 18   Ht 5\' 9"  (1.753 m)   Wt 83.9 kg   SpO2 99%   BMI 27.32 kg/m  SpO2: SpO2: 99 % O2 Device: O2 Device:  Room Air O2 Flow Rate: O2 Flow Rate (L/min): 2 L/min  Intake/output summary:  Intake/Output Summary (Last 24 hours) at 09/23/2021 1315 Last data filed at 09/23/2021 0600 Gross per 24 hour  Intake 1224.92 ml  Output 650 ml  Net 574.92 ml    LBM: Last BM Date : 09/23/21 Baseline Weight: Weight: 83.9 kg Most recent weight:  Weight: 83.9 kg       Palliative Assessment/Data:      Patient Active Problem List   Diagnosis Date Noted   Pressure injury of skin 09/20/2021   Metastasis to brain ([place]) 09/18/2021   Sinus tachycardia 09/12/2021   Anemia of chronic disease 09/11/2021   Bandemia 09/11/2021   Cancer related pain 09/10/2021   Lactic acidosis 09/10/2021   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[place]) 09/06/2021   Pain from bone metastases ([place]) 09/06/2021   Chest wall pain 09/04/2021   Metastatic adenocarcinoma ([place]) 09/04/2021   Intractable pain 09/04/2021   Pulmonary embolism ([place]) 08/22/2021   Acute pulmonary embolism, unspecified pulmonary embolism type, unspecified whether acute cor pulmonale present ([place]) 08/22/2021   Primary cancer of right upper lobe of lung ([place]) 07/08/2018   Urinary incontinence 04/05/2011    Palliative Care Assessment & Plan   Patient Profile:    Assessment:  56 year old F with PMH of 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  Patient was discharged on 2 L by [place] last hospitalization.    Radiation oncology consulted, and patient started radiation treatment on 7/17.  Pain control remains barriers to discharge.  Oncology and palliative medicine consulted.    Patient underwent US-guided biopsy of LUE soft tissue mass on 7/21.  Pathology pending.  Palliative medicine following for pain  management  Recommendations/Plan: Pain management recommendations discussed with patient after medication history was reviewed:  1. trans dermal Fentanyl 100 mcg Q 72 hours.      2.  Continue PO Dilaudid 2 mg Q 3 hours for moderate pain and 4 mg PO Q 3 hours for severe pain. Use first and use PRN IV Dilaudid for breakthrough pain.  Continue other adjuvants and monitor. Patient states that she needs IV Dilaudid for breakthouh pain in order to tolerate her radiation treatments. Continue to monitor her pain regimen. Radiation oncology input is being sought for her MRI brain findings.  GI following, endoscopy as part of oncology work up soon, prep being taken today.     Goals of Care and Additional Recommendations: Limitations on Scope of Treatment: Full Scope Treatment  Code Status:    Code Status Orders  (From admission, onward)           Start     Ordered   09/04/21 0035  Full code  Continuous        09/04/21 0037           Code Status History     Date Active Date Inactive Code Status Order ID Comments User Context   08/22/2021 0743 08/27/2021 0129 Full Code [id number]  [name], MD Inpatient   08/22/2021 0309 08/22/2021 0743 Full Code [id number]  Howerter, Ethelda Chick, DO ED       Prognosis:  Unable to determine  Discharge Planning: To Be Determined  Care plan was discussed with  patient     Thank you for allowing the Palliative Medicine Team to assist in the care of this patient.  MOD MDM.     Greater than 50%  of this time was spent counseling and coordinating care related to the above assessment and plan.  Loistine Chance, MD  Please contact Palliative Medicine Team phone at [phone] for questions and concerns.

## 2021-09-23 NOTE — Progress Notes (Signed)
Spoke to Dr. Alessandra Bevels about [name] not finishing her colonoscopy prep drink, And that her stool was still formed and brown.  Dr. Alessandra Bevels stated that patient can still continue the prep until 9:00 am.  And also to keep heparin drip on hold until  He and PA make further assessment. Nurse will report this information to am nurse.

## 2021-09-23 NOTE — Progress Notes (Signed)
ANTICOAGULATION CONSULT NOTE -follow up  Pharmacy Consult for heparin Indication: pulmonary embolus  Allergies  Allergen Reactions   Fish Allergy Hives   Penicillins Hives   Augmentin [Amoxicillin-Pot Clavulanate] Hives   Betadine [Povidone Iodine] Hives and Other (See Comments)    Bad hives, per pt   Shellfish-Derived Products Hives   Tetanus Toxoids Hives    Patient Measurements: Height: 5\' 9"  (175.3 cm) Weight: 83.9 kg (185 lb) IBW/kg (Calculated) : 66.2 Heparin Dosing Weight: 83 kg  Vital Signs: Temp: 97.9 F (36.6 C) (07/31 1603) Temp Source: Oral (07/31 1603) BP: 109/65 (07/31 1603) Pulse Rate: 82 (07/31 1603)  Labs: Recent Labs    09/21/21 1454 09/22/21 0404 09/22/21 1258 09/22/21 2102 09/23/21 0420 09/23/21 1611  HGB 8.5* 8.4*  --   --  8.7*  --   HCT 26.8* 26.1*  --   --  27.7*  --   PLT 294 276  --   --  237  --   APTT 40* 137* 109* 117*  --   --   LABPROT 20.3*  --   --   --   --   --   INR 1.8*  --   --   --   --   --   HEPARINUNFRC  --  >1.10*  --   --   --  0.71*  CREATININE  --  0.66  --   --   --   --      Estimated Creatinine Clearance: 88.7 mL/min (by C-G formula based on SCr of 0.66 mg/dL).   Medical History: Past Medical History:  Diagnosis Date   Allergy    Arthritis    Complication of anesthesia    Metastatic adenocarcinoma ([place]) 2023   Pulmonary, mediastinal, left chest wall/breast, hepatic, pancreatic, renal, IM, SubQ mets   Non-small cell cancer of right lung ([place]) 2020   PONV (postoperative nausea and vomiting)    nausea with dizziness   Pulmonary embolism ([place]) 08/22/2021      Assessment: 56 year old female admitted with chest pain. Patient recently hospitalized 6/28-7/3 for respiratory failure in the setting of acute bilateral PE. Patient was started on Eliquis. Patient has been on Eliquis since admission, now with need for EGD/colonoscopy and will need to be off Eliquis. Plan for heparin infusion while Eliquis on hold -  current plan for procedures on Tuesday. Per GI, will need to hold heparin 6 hours prior to start of procedure.  Last dose of Eliquis 7/29 ~ 1030.  09/23/2021 -aPTT 61 sub-therapeutic on 700 units/hr -Per RN no bleeding or infusion issues  Goal of Therapy:  Heparin level 0.3-0.7 units/ml aPTT 66-102 seconds Monitor platelets by anticoagulation protocol: Yes   Plan:  -Increase heparin infusion to 800 units/hr -6 hr aPTT after rate increase -Daily CBC and aPTT & HL -Heparin to be held at 0800 8/1 am for procedure   Thank you for allowing pharmacy to be a part of this patient's care.  Royetta Asal, PharmD, BCPS Clinical Pharmacist [place] Please utilize Amion for appropriate phone number to reach the unit pharmacist ([place]) 09/23/2021 4:46 PM

## 2021-09-24 ENCOUNTER — Ambulatory Visit: Payer: BLUE CROSS/BLUE SHIELD

## 2021-09-24 ENCOUNTER — Inpatient Hospital Stay (HOSPITAL_COMMUNITY): Payer: BLUE CROSS/BLUE SHIELD | Admitting: Anesthesiology

## 2021-09-24 ENCOUNTER — Encounter (HOSPITAL_COMMUNITY): Admission: EM | Disposition: E | Payer: Self-pay | Source: Home / Self Care | Attending: Student

## 2021-09-24 ENCOUNTER — Encounter (HOSPITAL_COMMUNITY): Payer: Self-pay | Admitting: Family Medicine

## 2021-09-24 DIAGNOSIS — R0789 Other chest pain: Secondary | ICD-10-CM | POA: Diagnosis not present

## 2021-09-24 HISTORY — PX: ESOPHAGOGASTRODUODENOSCOPY (EGD) WITH PROPOFOL: SHX5813

## 2021-09-24 HISTORY — PX: BIOPSY: SHX5522

## 2021-09-24 HISTORY — PX: COLONOSCOPY: SHX5424

## 2021-09-24 LAB — CBC
HCT: 28.2 % — ABNORMAL LOW (ref 36.0–46.0)
Hemoglobin: 8.8 g/dL — ABNORMAL LOW (ref 12.0–15.0)
MCH: 23.7 pg — ABNORMAL LOW (ref 26.0–34.0)
MCHC: 31.2 g/dL (ref 30.0–36.0)
MCV: 76 fL — ABNORMAL LOW (ref 80.0–100.0)
Platelets: 216 10*3/uL (ref 150–400)
RBC: 3.71 MIL/uL — ABNORMAL LOW (ref 3.87–5.11)
RDW: 19.1 % — ABNORMAL HIGH (ref 11.5–15.5)
WBC: 16.2 10*3/uL — ABNORMAL HIGH (ref 4.0–10.5)
nRBC: 0.3 % — ABNORMAL HIGH (ref 0.0–0.2)

## 2021-09-24 LAB — BASIC METABOLIC PANEL
Anion gap: 15 (ref 5–15)
BUN: 23 mg/dL — ABNORMAL HIGH (ref 6–20)
CO2: 21 mmol/L — ABNORMAL LOW (ref 22–32)
Calcium: 8.8 mg/dL — ABNORMAL LOW (ref 8.9–10.3)
Chloride: 99 mmol/L (ref 98–111)
Creatinine, Ser: 0.54 mg/dL (ref 0.44–1.00)
GFR, Estimated: >60 mL/min (ref >60)
Glucose, Bld: 95 mg/dL (ref 70–99)
Potassium: 4.6 mmol/L (ref 3.5–5.1)
Sodium: 135 mmol/L (ref 135–145)

## 2021-09-24 LAB — APTT: aPTT: 91 seconds — ABNORMAL HIGH (ref 24–36)

## 2021-09-24 SURGERY — ESOPHAGOGASTRODUODENOSCOPY (EGD) WITH PROPOFOL
Anesthesia: Monitor Anesthesia Care

## 2021-09-24 MED ORDER — LACTATED RINGERS IV SOLN
INTRAVENOUS | Status: AC | PRN
  Administered 2021-09-24: 1000 mL via INTRAVENOUS

## 2021-09-24 MED ORDER — PANTOPRAZOLE SODIUM 40 MG IV SOLR
40.0000 mg | Freq: Two times a day (BID) | INTRAVENOUS | Status: DC
  Administered 2021-09-24 – 2021-09-30 (×13): 40 mg via INTRAVENOUS
  Filled 2021-09-24 (×13): qty 10

## 2021-09-24 MED ORDER — PROPOFOL 10 MG/ML IV BOLUS
INTRAVENOUS | Status: DC | PRN
  Administered 2021-09-24 (×2): 20 mg via INTRAVENOUS
  Administered 2021-09-24: 30 mg via INTRAVENOUS
  Administered 2021-09-24 (×4): 20 mg via INTRAVENOUS
  Administered 2021-09-24: 10 mg via INTRAVENOUS
  Administered 2021-09-24: 20 mg via INTRAVENOUS

## 2021-09-24 MED ORDER — PROPOFOL 500 MG/50ML IV EMUL
INTRAVENOUS | Status: AC
  Filled 2021-09-24: qty 100

## 2021-09-24 MED ORDER — PROPOFOL 10 MG/ML IV BOLUS
INTRAVENOUS | Status: AC
  Filled 2021-09-24: qty 20

## 2021-09-24 MED ORDER — PROPOFOL 500 MG/50ML IV EMUL
INTRAVENOUS | Status: DC | PRN
  Administered 2021-09-24: 125 ug/kg/min via INTRAVENOUS

## 2021-09-24 MED ORDER — FENTANYL CITRATE (PF) 100 MCG/2ML IJ SOLN
25.0000 ug | INTRAMUSCULAR | Status: DC
  Administered 2021-09-24: 25 ug via INTRAVENOUS

## 2021-09-24 MED ORDER — LIDOCAINE HCL (CARDIAC) PF 100 MG/5ML IV SOSY
PREFILLED_SYRINGE | INTRAVENOUS | Status: DC | PRN
  Administered 2021-09-24: 100 mg via INTRAVENOUS

## 2021-09-24 MED ORDER — APIXABAN 2.5 MG PO TABS
5.0000 mg | ORAL_TABLET | Freq: Two times a day (BID) | ORAL | Status: DC
  Administered 2021-09-24 – 2021-09-30 (×12): 5 mg via ORAL
  Filled 2021-09-24 (×6): qty 2
  Filled 2021-09-24: qty 1
  Filled 2021-09-24 (×8): qty 2

## 2021-09-24 MED ORDER — FENTANYL CITRATE (PF) 100 MCG/2ML IJ SOLN
INTRAMUSCULAR | Status: AC
  Filled 2021-09-24: qty 2

## 2021-09-24 MED ORDER — CARBAMIDE PEROXIDE 6.5 % OT SOLN
5.0000 [drp] | Freq: Two times a day (BID) | OTIC | Status: AC
  Administered 2021-09-24 – 2021-09-25 (×2): 5 [drp] via OTIC
  Filled 2021-09-24: qty 15

## 2021-09-24 MED ORDER — ZINC OXIDE 12.8 % EX OINT
TOPICAL_OINTMENT | Freq: Three times a day (TID) | CUTANEOUS | Status: DC
  Administered 2021-09-24 (×2): 1 via TOPICAL
  Filled 2021-09-24: qty 56.7

## 2021-09-24 SURGICAL SUPPLY — 25 items
BLOCK BITE 60FR ADLT L/F BLUE (MISCELLANEOUS) ×4 IMPLANT
ELECT REM PT RETURN 9FT ADLT (ELECTROSURGICAL)
ELECTRODE REM PT RTRN 9FT ADLT (ELECTROSURGICAL) IMPLANT
FCP BXJMBJMB 240X2.8X (CUTTING FORCEPS)
FLOOR PAD 36X40 (MISCELLANEOUS) ×3
FORCEP RJ3 GP 1.8X160 W-NEEDLE (CUTTING FORCEPS) IMPLANT
FORCEPS BIOP RAD 4 LRG CAP 4 (CUTTING FORCEPS) IMPLANT
FORCEPS BIOP RJ4 240 W/NDL (CUTTING FORCEPS)
FORCEPS BXJMBJMB 240X2.8X (CUTTING FORCEPS) IMPLANT
INJECTOR/SNARE I SNARE (MISCELLANEOUS) IMPLANT
LUBRICANT JELLY 4.5OZ STERILE (MISCELLANEOUS) IMPLANT
MANIFOLD NEPTUNE II (INSTRUMENTS) IMPLANT
NDL SCLEROTHERAPY 25GX240 (NEEDLE) IMPLANT
NEEDLE SCLEROTHERAPY 25GX240 (NEEDLE) IMPLANT
PAD FLOOR 36X40 (MISCELLANEOUS) ×3 IMPLANT
PROBE APC STR FIRE (PROBE) IMPLANT
PROBE INJECTION GOLD (MISCELLANEOUS)
PROBE INJECTION GOLD 7FR (MISCELLANEOUS) IMPLANT
SNARE ROTATE MED OVAL 20MM (MISCELLANEOUS) IMPLANT
SNARE SHORT THROW 13M SML OVAL (MISCELLANEOUS) IMPLANT
SYR 50ML LL SCALE MARK (SYRINGE) IMPLANT
TRAP SPECIMEN MUCOUS 40CC (MISCELLANEOUS) IMPLANT
TUBING ENDO SMARTCAP PENTAX (MISCELLANEOUS) ×8 IMPLANT
TUBING IRRIGATION ENDOGATOR (MISCELLANEOUS) ×8 IMPLANT
WATER STERILE IRR 1000ML POUR (IV SOLUTION) IMPLANT

## 2021-09-24 NOTE — Brief Op Note (Signed)
09/13/2021 - 10/01/2021  1:42 PM  PATIENT:  [name] Docter  56 y.o. female  PRE-OPERATIVE DIAGNOSIS:  Metastatic disease, evaluate for GI primary  POST-OPERATIVE DIAGNOSIS:  esophigitis, gastritis, normal colon  PROCEDURE:  Procedure(s): ESOPHAGOGASTRODUODENOSCOPY (EGD) WITH PROPOFOL (N/A) COLONOSCOPY WITH PROPOFOL (N/A) BIOPSY  SURGEON:  Surgeon(s) and Role: Panel 1:    * Viola Kinnick, MD - Primary Panel 2:    * Helyne Genther, MD - Primary  Findings ------------ -EGD showed LA grade D erosive esophagitis and gastritis.  Biopsies taken.  Colonoscopy unfortunately showed poor prep.  Recommendations ------------------------- -Advance diet -No further inpatient GI work-up planned.  GI will sign off.  Call us back if needed -ok to resume heparin/anticoagulation from GI standpoint  Otis Brace MD, [place] 10/11/2021, 1:43 PM  Contact #  [phone]

## 2021-09-24 NOTE — Progress Notes (Signed)
ANTICOAGULATION CONSULT NOTE -follow up  Pharmacy Consult for heparin Indication: pulmonary embolus  Allergies  Allergen Reactions   Fish Allergy Hives   Penicillins Hives   Augmentin [Amoxicillin-Pot Clavulanate] Hives   Betadine [Povidone Iodine] Hives and Other (See Comments)    Bad hives, per pt   Shellfish-Derived Products Hives   Tetanus Toxoids Hives    Patient Measurements: Height: 5\' 9"  (175.3 cm) Weight: 83.9 kg (185 lb) IBW/kg (Calculated) : 66.2 Heparin Dosing Weight: 83 kg  Vital Signs: Temp: 98 F (36.7 C) (07/31 2202) Temp Source: Oral (07/31 2202) BP: 112/62 (07/31 2202) Pulse Rate: 90 (07/31 2202)  Labs: Recent Labs    09/21/21 1454 09/22/21 0404 09/22/21 1258 09/22/21 2102 09/23/21 0420 09/23/21 1611 10/01/2021 0018  HGB 8.5* 8.4*  --   --  8.7*  --  8.8*  HCT 26.8* 26.1*  --   --  27.7*  --  28.2*  PLT 294 276  --   --  237  --  216  APTT 40* 137*   < > 117*  --  61* 91*  LABPROT 20.3*  --   --   --   --   --   --   INR 1.8*  --   --   --   --   --   --   HEPARINUNFRC  --  >1.10*  --   --   --  0.71*  --   CREATININE  --  0.66  --   --   --   --   --    < > = values in this interval not displayed.     Estimated Creatinine Clearance: 88.7 mL/min (by C-G formula based on SCr of 0.66 mg/dL).   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 Patient recently hospitalized 6/28-7/3 for respiratory failure in the setting of acute bilateral PE. Patient was started on Eliquis. Patient has been on Eliquis since admission, now with need  for EGD/colonoscopy and will need to be off Eliquis. Plan for heparin infusion while Eliquis on hold - current plan for procedures on Tuesday. Per GI, will need to hold heparin 6 hours prior to start of procedure.  Last dose of Eliquis 7/29 ~ 1030.  10/14/2021 -aPTT 91 therapeutic on 800 units/hr - Hgb low but stable, plts WNL -Per RN no bleeding or infusion noted  Goal of Therapy:  Heparin level 0.3-0.7 units/ml aPTT 66-102 seconds Monitor platelets by anticoagulation protocol: Yes   Plan:  -continue heparin infusion at 800 units/hr -Daily CBC and aPTT & HL -Heparin to be held at 0800 8/1 am for procedure   Thank you for allowing pharmacy to be a part of this patient's care.  Dolly Rias RPh 10/04/2021, 1:06 AM

## 2021-09-24 NOTE — Consult Note (Signed)
[facility] Nurse Consult Note: Patient currently in Endoscopy suite. Reason for Consult: "decubitus ulcers" The patient is currently unavailable to me for wound assessment. Patient will be assessed once she is in her room and the WOC is present in the facility. Val Riles, RN, MSN, CWOCN, CNS-BC, pager [phone]

## 2021-09-24 NOTE — Progress Notes (Addendum)
PROGRESS NOTE  [name] FGH:[id number] DOB: 05-09-1965   PCP: Pcp, No  Patient is from: Home  DOA: 09/15/2021 LOS: 83  Chief complaints Chief Complaint  Patient presents with   Chest Pain     Brief Narrative / Interim history: 56 year old F with PMH of stage Ib non-small lung cancer s/p RUL lobectomy in 2020 followed by adjuvant treatment, recent diagnosis of widespread metastatic adenocarcinoma with unclear primary, recent hospitalization from 6/28-7/3 for acute respiratory failure with hypoxia in the setting of acute PE and and cancer related pain returning with cancer related pain that was not controlled with home regimen, and admitted for the same.  Patient was discharged on 2 L by [place] last hospitalization.  Patient underwent US-guided biopsy of LUE soft tissue mass on 7/21.  Pathology showed adenocarcinoma of unclear primary.  MRI brain concerning for brain mets and extracranial soft tissue mets.  [facility] consulted per recommendation by oncology.  CT abdomen and pelvis ordered.  Palliative medicine following for pain management.  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  Pain control on 7/29 but now needs EGD/colonoscopy-- had to be off eliquis so heparin started     Subjective: Plan for colonoscopy today  Objective: Vitals:   09/23/21 2202 09/25/2021 0644 10/06/2021 0909 10/06/2021 1202  BP: 112/62 105/62 125/67 107/71  Pulse: 90 85 100 79  Resp: 16 16 17 18   Temp: 98 F (36.7 C) 98.2 F (36.8 C) 98.2 F (36.8 C) 98.2 F (36.8 C)  TempSrc: Oral  Oral Oral  SpO2: 99% 100% 99% 99%  Weight:      Height:        Examination:  General: Appearance:     Overweight female in no acute distress     Lungs:     Clear to auscultation bilaterally, respirations unlabored  Heart:    Normal heart rate. +le edema  MS:   All extremities are intact.   Neurologic:   Awake, alert, oriented x 3. No apparent focal  neurological           defect.            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  Diagnosed with widespread mets in 06/2021.  Had liver biopsy in [place] in 6/23 that showed adenocarcinoma with immunoreactivity not typical for lung cancer.  CT angio chest showed large right suprahilar mass extending continuously into mediastinum and measuring about 6.3 cm maximal diameter with mets to brain, liver, tail of pancreas, left anterior fifth ribs and multiple vertebral bodies as well as the left breast tissue.  MRI brain concerning for brain mets -Started radiation to the spine and ribs on 7/17>> -Pathology from left arm tissue biopsy on 7/21 shows adenocarcinoma but unclear primary.  -[facility] consulted and recommended CT abdomen and pelvis-ordered. -now plan for EGD/colonoscopy after prep complete-- 8/1  Cancer  related pain:  -Appreciate help by palliative medicine-adjusting pain meds -Aggressive bowel regimen Currently seems to be controlled on current regimen but not in the evenings   Left arm swelling: reported on 7/13.  CT left humerus on 7/14 showed 3.7 cm left infraspinatus muscle, 6.2 cm left biceps muscle and other multiple soft tissue masses along the left chest wall and within the left breast soft tissues consistent with metastatic disease  Recent GX:QJJHERDEY PE diagnosed on 08/22/21.  She was discharged on 2 L by [place].  Now on room air. -heparin gtt until  colonoscopy and EGD done then resume eliquis  Sinus tachycardia: Likely due to pain and underlying PE.  Resolved. -Continue metoprolol 50 mg twice daily  Anemia of chronic disease: Likely due to malignancy.  Physical deconditioning -Therapy recommended home health.   -Hospital bed, RW and 3 and 1 ordered.  Earwax in left ear: -Debrox eardrops  Bandemia: Likely from malignancy and demargination. -Monitor  Pressure Injury 09/19/21 Buttocks Right;Left Stage 2 -  Partial thickness loss of dermis presenting as a shallow open injury with a red, pink wound bed without slough. 2 areas of full thickness wounds, NOT pressure (Active)  09/19/21 1950  Location: Buttocks  Location Orientation: Right;Left  Staging: Stage 2 -  Partial thickness loss of dermis presenting as a shallow open injury with a red, pink wound bed without slough.  Wound Description (Comments): 2 areas of full thickness wounds, NOT pressure  Present on Admission: No       DVT prophylaxis: heparin gtt-- will need to be changed back to elquis when ok with GI   Code Status: Full code Family Communication: None at bedside. Level of care: Med-Surg Status is: Inpatient Remains inpatient appropriate because: need scopes   Final disposition: Likely home with HH and DME 24-48 hours   Consultants:  Radiation oncology Palliative medicine Interventional radiology GI  Sch Meds:  Scheduled Meds:  celecoxib  200 mg Oral BID   dexamethasone (DECADRON) injection  8 mg Intravenous Q24H   famotidine  20 mg Oral QHS   fentaNYL  1 patch Transdermal Q72H   gabapentin  200 mg Oral TID   latanoprost  1 drop Both Eyes QHS   leptospermum manuka honey  1 Application Topical Daily   lidocaine  1 patch Transdermal Q24H   metoprolol tartrate  50 mg Oral BID   senna  1 tablet Oral QHS   sodium chloride flush  3 mL Intravenous Q12H   traZODone  100 mg Oral QHS   Continuous Infusions:  sodium chloride Stopped (10/06/2021 1000)    PRN Meds:.acetaminophen **OR** acetaminophen, HYDROmorphone (DILAUDID) injection, HYDROmorphone, magnesium hydroxide, ondansetron **OR** ondansetron (ZOFRAN) IV, mouth rinse, polyethylene glycol, senna-docusate  Antimicrobials: Anti-infectives (From admission, onward)    None        I have personally reviewed the following labs and images: CBC: Recent Labs  Lab 09/21/21 1454 09/22/21 0404 09/23/21 0420 10/02/2021 0018  WBC 14.6* 14.7* 15.3* 16.2*  HGB 8.5* 8.4* 8.7* 8.8*  HCT 26.8* 26.1* 27.7* 28.2*  MCV 75.5* 74.8* 76.5* 76.0*  PLT 294 276 237 216    BMP &GFR Recent Labs  Lab 09/22/21 0404 10/13/2021 0018  NA 139 135  K 4.6 4.6  CL 102 99  CO2 24 21*  GLUCOSE 97 95  BUN 30* 23*  CREATININE 0.66 0.54  CALCIUM 9.2 8.8*    Estimated Creatinine Clearance: 88.7 mL/min (by C-G formula based on SCr of 0.54 mg/dL). Liver & Pancreas: No  results for input(s): "AST", "ALT", "ALKPHOS", "BILITOT", "PROT", "ALBUMIN" in the last 168 hours.  No results for input(s): "LIPASE", "AMYLASE" in the last 168 hours. No results for input(s): "AMMONIA" in the last 168 hours. Diabetic: No results for input(s): "HGBA1C" in the last 72 hours. No results for input(s): "GLUCAP" in the last 168 hours. Cardiac Enzymes: No results for input(s): "CKTOTAL", "CKMB", "CKMBINDEX", "TROPONINI" in the last 168 hours.  No results for input(s): "PROBNP" in the last 8760 hours. Coagulation Profile: Recent Labs  Lab 09/21/21 1454  INR 1.8*   Thyroid Function Tests: No results for input(s): "TSH", "T4TOTAL", "FREET4", "T3FREE", "THYROIDAB" in the last 72 hours.  Lipid Profile: No results for input(s): "CHOL", "HDL", "LDLCALC", "TRIG", "CHOLHDL", "LDLDIRECT" in the last 72 hours. Anemia Panel: No results for input(s): "VITAMINB12", "FOLATE", "FERRITIN", "TIBC", "IRON", "RETICCTPCT" in the last 72 hours. Urine analysis:    Component Value Date/Time   COLORURINE YELLOW 02/14/2010 0600    APPEARANCEUR CLEAR 02/14/2010 0600   LABSPEC 1.028 02/14/2010 0600   PHURINE 5.5 02/14/2010 0600   GLUCOSEU NEGATIVE 02/14/2010 0600   HGBUR NEGATIVE 02/14/2010 0600   BILIRUBINUR LARGE (A) 02/14/2010 0600   KETONESUR NEGATIVE 02/14/2010 0600   PROTEINUR NEGATIVE 02/14/2010 0600   UROBILINOGEN 1.0 02/14/2010 0600   NITRITE NEGATIVE 02/14/2010 0600   LEUKOCYTESUR  02/14/2010 0600    NEGATIVE MICROSCOPIC NOT DONE ON URINES WITH NEGATIVE PROTEIN, BLOOD, LEUKOCYTES, NITRITE, OR GLUCOSE <1000 mg/dL.   Sepsis Labs: Invalid input(s): "PROCALCITONIN", "LACTICIDVEN"  Microbiology: No results found for this or any previous visit (from the past 240 hour(s)).  Radiology Studies: No results found.    Eulogio Bear DO [facility]  If 7PM-7AM, please contact night-coverage [email or web address] 10/08/2021, 12:06 PM

## 2021-09-24 NOTE — Transfer of Care (Signed)
Immediate Anesthesia Transfer of Care Note  Patient: [name]  Procedure(s) Performed: ESOPHAGOGASTRODUODENOSCOPY (EGD) WITH PROPOFOL BIOPSY COLONOSCOPY WITH PROPOFOL  Patient Location: Endoscopy Unit  Anesthesia Type:MAC  Level of Consciousness: awake, alert , oriented and patient cooperative  Airway & Oxygen Therapy: Patient Spontanous Breathing  Post-op Assessment: Report given to RN and Post -op Vital signs reviewed and stable  Post vital signs: Reviewed and stable  Last Vitals:  Vitals Value Taken Time  BP 131/73 1339  Temp    Pulse 87 1340  Resp 22 1340  SpO2 91% 1340    Last Pain:  Vitals:   10/06/2021 1227  TempSrc: Tympanic  PainSc: 0-No pain      Patients Stated Pain Goal: 3 (34/37/35 7897)  Complications: No notable events documented.

## 2021-09-24 NOTE — Consult Note (Signed)
WOC Nurse Consult Note: Patient in recliner in [place]. Patient's sister, [name], providing much of the information about the wounds areas on the bilateral buttocks Reason for Consult:"decubitus ulcers" Wound type: Probable MASD-ITD to bilateral fleshy portions of the buttocks that lie next to each other, not in the intergluteal cleft. The patient's sister tells me the wounds that are scattered, small, and pink without drainage, have not improved with the use of Medihoney. Pressure Injury POA: Yes/No/NA Measurement: Wound bed: all are clean and pink Drainage (amount, consistency, odor) none Periwound: intact Dressing procedure/placement/frequency: I have ordered triple paste for TID and prn; a standard size bed with air mattress, and a pressure redistribution pad for the chair.  [name] and I spoke about complications from using a donut type device, which she had provided to the patient previously and the patient had used.  Monitor the wound area(s) for worsening of condition such as: Signs/symptoms of infection,  Increase in size,  Development of or worsening of odor, Development of pain, or increased pain at the affected locations.  Notify the medical team if any of these develop.  Thank you for the consult.  Discussed plan of care with the patient and bedside nurse.  [place] nurse will not follow at this time.  Please re-consult the [place] team if needed.  Val Riles, RN, MSN, CWOCN, CNS-BC, pager [phone]

## 2021-09-24 NOTE — Plan of Care (Signed)

## 2021-09-24 NOTE — Op Note (Signed)
[facility] Patient Name: [name] Procedure Date: 09/29/2021 MRN: [id number] Attending MD: Otis Brace , MD Date of Birth: 09/18/1965 CSN: [id number] Age: 56 Admit Type: Inpatient Procedure:                Upper GI endoscopy Indications:              Personal history of malignant neoplasm Providers:                Otis Brace, MD, Kary Kos RN, RN, Benetta Spar, Technician Referring MD:              Medicines:                Sedation Administered by an Anesthesia Professional Complications:            No immediate complications. Estimated Blood Loss:     Estimated blood loss was minimal. Procedure:                Pre-Anesthesia Assessment:                           - Prior to the procedure, a History and Physical                            was performed, and patient medications and                            allergies were reviewed. The patient's tolerance of                            previous anesthesia was also reviewed. The risks                            and benefits of the procedure and the sedation                            options and risks were discussed with the patient.                            All questions were answered, and informed consent                            was obtained. Prior Anticoagulants: The patient has                            taken heparin, last dose was day of procedure. ASA                            Grade Assessment: IV - A patient with severe                            systemic disease that is a constant threat to life.  After reviewing the risks and benefits, the patient                            was deemed in satisfactory condition to undergo the                            procedure.                           After obtaining informed consent, the endoscope was                            passed under direct vision. Throughout the                             procedure, the patient's blood pressure, pulse, and                            oxygen saturations were monitored continuously. The                            [id number]-[id number] ([id number]) Olympus endoscope was introduced                            through the mouth, and advanced to the second part                            of duodenum. The upper GI endoscopy was                            accomplished without difficulty. The patient                            tolerated the procedure well. Findings:      LA Grade D (one or more mucosal breaks involving at least 75% of       esophageal circumference) esophagitis with bleeding was found in the       entire esophagus. Biopsies were taken with a cold forceps for histology.      Diffuse moderate inflammation characterized by congestion (edema),       erosions, erythema and friability was found in the entire examined       stomach. Biopsies were taken with a cold forceps for histology.      The cardia and gastric fundus were normal on retroflexion.      The duodenal bulb, first portion of the duodenum and second portion of       the duodenum were normal. Impression:               - LA Grade D erosive esophagitis with bleeding.                            Biopsied.                           - Chronic gastritis. Biopsied.                           -  Normal duodenal bulb, first portion of the                            duodenum and second portion of the duodenum. Moderate Sedation:      Moderate (conscious) sedation was personally administered by an       anesthesia professional. The following parameters were monitored: oxygen       saturation, heart rate, blood pressure, and response to care. Recommendation:           - Perform a colonoscopy today. Procedure Code(s):        --- Professional ---                           [phone], Esophagogastroduodenoscopy, flexible,                            transoral; with biopsy, single or multiple Diagnosis Code(s):         --- Professional ---                           K20.81, Other esophagitis with bleeding                           K29.50, Unspecified chronic gastritis without                            bleeding                           Z85.9, Personal history of malignant neoplasm,                            unspecified CPT copyright 2019 American Medical Association. All rights reserved. The codes documented in this report are preliminary and upon coder review may  be revised to meet current compliance requirements. Otis Brace, MD Otis Brace, MD 10/23/2021 1:36:13 PM Number of Addenda: 0

## 2021-09-24 NOTE — TOC Progression Note (Signed)
Transition of Care [facility]) - Progression Note   Patient Details  Name: [name] MRN: [id number] Date of Birth: 26-Mar-1965  Transition of Care [facility]) CM/SW [place], LCSW Phone Number: 10/10/2021, 3:41 PM  Clinical Narrative: Patient is expected to be medically ready for discharge this week. CSW followed up with patient's sister, Evlyn Clines, as patient is receovering from procedures today. Per sister, the patient has a portable oxygen tank at home through [facility] that should be full enough to use for transport home. Sister confirmed the DME has not yet been delivered to the home as patient was not close to discharge. Sister aware a new hospital appointment will need to be scheduled since the patient was still hospitalized last week when her original appointment was scheduled.  CSW followed up with Brenton Grills with [employer] regarding DME. [employer] has the DME orders and can deliver to the home now that patient is closer to discharge. [employer] to follow up with sister regarding delivery.  CSW called [facility] to reschedule the hospital follow up appointment. Patient was scheduled with Dr. Jodi Mourning on Monday September 30, 2021 at 9:45am. CSW updated sister.  Expected Discharge Plan: Home/Self Care Barriers to Discharge: No [place] will accept this patient  Expected Discharge Plan and Services Expected Discharge Plan: Home/Self Care Discharge Planning Services: CM Consult Living arrangements for the past 2 months: Single Family Home  Readmission Risk Interventions    09/10/2021    2:32 PM  Readmission Risk Prevention Plan  Transportation Screening Complete  PCP or Specialist Appt within 5-7 Days Complete  Home Care Screening Complete  Medication Review (RN CM) Complete

## 2021-09-24 NOTE — Progress Notes (Signed)
[name] is a 56 y.o. female with metastatic adenocarcinoma with possible GI primary.  The various methods of treatment have been discussed with the patient and family. After consideration of risks, benefits and other options for treatment, the patient has consented to  Procedure(s): EGD and colonoscopy as a surgical intervention .  The patient's history has been reviewed, patient examined, no change in status, stable for surgery.  I have reviewed the patient's chart and labs.  Questions were answered to the patient's satisfaction.    Risks (bleeding, infection, bowel perforation that could require surgery, sedation-related changes in cardiopulmonary systems), benefits (identification and possible treatment of source of symptoms, exclusion of certain causes of symptoms), and alternatives (watchful waiting, radiographic imaging studies, empiric medical treatment)  were explained to patient/family in detail and patient wishes to proceed.   Otis Brace MD, FACP 10/21/2021, 1:03 PM  Contact #  [phone]

## 2021-09-24 NOTE — Progress Notes (Signed)
ANTICOAGULATION CONSULT NOTE - Initial Consult  Pharmacy Consult for Eliquis Indication:  Recent PE  Allergies  Allergen Reactions   Fish Allergy Hives   Penicillins Hives   Augmentin [Amoxicillin-Pot Clavulanate] Hives   Betadine [Povidone Iodine] Hives and Other (See Comments)    Bad hives, per pt   Shellfish-Derived Products Hives   Tetanus Toxoids Hives    Patient Measurements: Height: 5\' 9"  (175.3 cm) Weight: 83.9 kg (184 lb 15.5 oz) IBW/kg (Calculated) : 66.2 Heparin Dosing Weight: 83.1 kg  Vital Signs: Temp: 97.7 F (36.5 C) (08/01 1227) Temp Source: Tympanic (08/01 1227) BP: 131/73 (08/01 1340) Pulse Rate: 88 (08/01 1340)  Labs: Recent Labs    09/21/21 1454 09/22/21 0404 09/22/21 1258 09/22/21 2102 09/23/21 0420 09/23/21 1611 10/19/2021 0018  HGB 8.5* 8.4*  --   --  8.7*  --  8.8*  HCT 26.8* 26.1*  --   --  27.7*  --  28.2*  PLT 294 276  --   --  237  --  216  APTT 40* 137*   < > 117*  --  61* 91*  LABPROT 20.3*  --   --   --   --   --   --   INR 1.8*  --   --   --   --   --   --   HEPARINUNFRC  --  >1.10*  --   --   --  0.71*  --   CREATININE  --  0.66  --   --   --   --  0.54   < > = values in this interval not displayed.    Estimated Creatinine Clearance: 88.7 mL/min (by C-G formula based on SCr of 0.54 mg/dL).   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  - 8/1: Hep off 0600. Hgb 8.8 stable today. Plts 216 dropping. Midnight level aPTT 91 in goal.   -EGD showed LA grade D erosive esophagitis and gastritis.  Colonoscopy unfortunately showed poor prep.  Goal of Therapy:  Therapeutic oral anticoagulation   Plan:  Ok to Resume Eliquis per GI Resume Eliquis 5mg  BID   Yoona Ishii S. Alford Highland, PharmD, BCPS Clinical Staff Pharmacist [email or web address] Alford Highland, [employer] 10/07/2021,1:49 PM

## 2021-09-24 NOTE — Op Note (Signed)
[facility] Patient Name: [name] Procedure Date: 10/15/2021 MRN: [id number] Attending MD: Otis Brace , MD Date of Birth: [birth year] CSN: [id number] Age: 56 Admit Type: Inpatient Procedure:                Colonoscopy Indications:              Personal history of malignant neoplasm Providers:                Otis Brace, MD, Kary Kos RN, RN, Benetta Spar, Technician Referring MD:              Medicines:                Sedation Administered by an Anesthesia Professional Complications:            No immediate complications. Estimated Blood Loss:     Estimated blood loss was minimal. Procedure:                Pre-Anesthesia Assessment:                           - Prior to the procedure, a History and Physical                            was performed, and patient medications and                            allergies were reviewed. The patient's tolerance of                            previous anesthesia was also reviewed. The risks                            and benefits of the procedure and the sedation                            options and risks were discussed with the patient.                            All questions were answered, and informed consent                            was obtained. Prior Anticoagulants: The patient has                            taken heparin, last dose was day of procedure. ASA                            Grade Assessment: IV - A patient with severe                            systemic disease that is a constant threat to life.  After reviewing the risks and benefits, the patient                            was deemed in satisfactory condition to undergo the                            procedure.                           After obtaining informed consent, the colonoscope                            was passed under direct vision. Throughout the                             procedure, the patient's blood pressure, pulse, and                            oxygen saturations were monitored continuously. The                            [id number]-HQ190L ([id number] Olympus colonoscope was                            introduced through the anus and advanced to the the                            cecum. The colonoscopy was technically difficult                            and complex due to poor bowel prep with stool                            present. The patient tolerated the procedure well.                            The quality of the bowel preparation was poor. Scope In: 1:20:13 PM Scope Out: 1:31:54 PM Scope Withdrawal Time: 0 hours 4 minutes 49 seconds  Total Procedure Duration: 0 hours 11 minutes 41 seconds  Findings:      Skin tags were found on perianal exam.      Copious quantities of semi-liquid semi-solid stool was found in the       entire colon, precluding visualization. Lavage of the area was       performed, resulting in incomplete clearance with continued poor       visualization.      Retroflexion in the rectum was not performed. Impression:               - Preparation of the colon was poor.                           - Perianal skin tags found on perianal exam.                           - Stool in  the entire examined colon.                           - No specimens collected. Moderate Sedation:      Moderate (conscious) sedation was personally administered by an       anesthesia professional. The following parameters were monitored: oxygen       saturation, heart rate, blood pressure, and response to care. Recommendation:           - Return patient to hospital ward for ongoing care.                           - Resume previous diet.                           - Continue present medications. Procedure Code(s):        --- Professional ---                           [phone], Colonoscopy, flexible; diagnostic, including                            collection of  specimen(s) by brushing or washing,                            when performed (separate procedure) Diagnosis Code(s):        --- Professional ---                           K64.4, Residual hemorrhoidal skin tags                           Z85.9, Personal history of malignant neoplasm,                            unspecified CPT copyright 2019 American Medical Association. All rights reserved. The codes documented in this report are preliminary and upon coder review may  be revised to meet current compliance requirements. Otis Brace, MD Otis Brace, MD 09/25/2021 1:41:45 PM Number of Addenda: 0

## 2021-09-24 NOTE — Anesthesia Postprocedure Evaluation (Signed)
Anesthesia Post Note  Patient: [name]  Procedure(s) Performed: ESOPHAGOGASTRODUODENOSCOPY (EGD) WITH PROPOFOL BIOPSY COLONOSCOPY WITH PROPOFOL     Patient location during evaluation: Endoscopy Anesthesia Type: MAC Level of consciousness: awake and alert Pain management: pain level controlled Vital Signs Assessment: post-procedure vital signs reviewed and stable Respiratory status: spontaneous breathing, nonlabored ventilation, respiratory function stable and patient connected to nasal cannula oxygen Cardiovascular status: blood pressure returned to baseline and stable Postop Assessment: no apparent nausea or vomiting Anesthetic complications: no   No notable events documented.  Last Vitals:  Vitals:   10/20/2021 1340 10/18/2021 1350  BP: 131/73 137/74  Pulse: 88 88  Resp: 16 17  Temp:    SpO2: 93% 92%    Last Pain:  Vitals:   10/07/2021 1350  TempSrc:   PainSc: 7                  Barnet Glasgow

## 2021-09-24 DEATH — deceased

## 2021-09-25 ENCOUNTER — Ambulatory Visit: Admit: 2021-09-25 | Payer: BLUE CROSS/BLUE SHIELD | Admitting: Radiation Oncology

## 2021-09-25 ENCOUNTER — Ambulatory Visit: Payer: BLUE CROSS/BLUE SHIELD

## 2021-09-25 ENCOUNTER — Inpatient Hospital Stay (HOSPITAL_COMMUNITY): Payer: BLUE CROSS/BLUE SHIELD

## 2021-09-25 ENCOUNTER — Ambulatory Visit: Admit: 2021-09-25 | Payer: BLUE CROSS/BLUE SHIELD

## 2021-09-25 DIAGNOSIS — R609 Edema, unspecified: Secondary | ICD-10-CM | POA: Diagnosis not present

## 2021-09-25 DIAGNOSIS — R0789 Other chest pain: Secondary | ICD-10-CM | POA: Diagnosis not present

## 2021-09-25 LAB — SURGICAL PATHOLOGY

## 2021-09-25 LAB — BASIC METABOLIC PANEL
Anion gap: 13 (ref 5–15)
BUN: 20 mg/dL (ref 6–20)
CO2: 24 mmol/L (ref 22–32)
Calcium: 9.2 mg/dL (ref 8.9–10.3)
Chloride: 99 mmol/L (ref 98–111)
Creatinine, Ser: 0.54 mg/dL (ref 0.44–1.00)
GFR, Estimated: >60 mL/min (ref >60)
Glucose, Bld: 96 mg/dL (ref 70–99)
Potassium: 4.5 mmol/L (ref 3.5–5.1)
Sodium: 136 mmol/L (ref 135–145)

## 2021-09-25 LAB — CBC
HCT: 29 % — ABNORMAL LOW (ref 36.0–46.0)
Hemoglobin: 9.1 g/dL — ABNORMAL LOW (ref 12.0–15.0)
MCH: 23.8 pg — ABNORMAL LOW (ref 26.0–34.0)
MCHC: 31.4 g/dL (ref 30.0–36.0)
MCV: 75.9 fL — ABNORMAL LOW (ref 80.0–100.0)
Platelets: 192 10*3/uL (ref 150–400)
RBC: 3.82 MIL/uL — ABNORMAL LOW (ref 3.87–5.11)
RDW: 19.9 % — ABNORMAL HIGH (ref 11.5–15.5)
WBC: 17.8 10*3/uL — ABNORMAL HIGH (ref 4.0–10.5)
nRBC: 0.3 % — ABNORMAL HIGH (ref 0.0–0.2)

## 2021-09-25 MED ORDER — GABAPENTIN 400 MG PO CAPS
400.0000 mg | ORAL_CAPSULE | Freq: Three times a day (TID) | ORAL | Status: DC
  Administered 2021-09-25 – 2021-10-01 (×17): 400 mg via ORAL
  Filled 2021-09-25 (×17): qty 1

## 2021-09-25 MED ORDER — HYDROMORPHONE HCL 2 MG PO TABS
8.0000 mg | ORAL_TABLET | ORAL | Status: DC | PRN
  Administered 2021-09-26 – 2021-10-01 (×3): 8 mg via ORAL
  Filled 2021-09-25 (×3): qty 4

## 2021-09-25 MED ORDER — ACETAMINOPHEN 500 MG PO TABS
1000.0000 mg | ORAL_TABLET | Freq: Three times a day (TID) | ORAL | Status: DC
  Administered 2021-09-25 – 2021-10-01 (×17): 1000 mg via ORAL
  Filled 2021-09-25 (×17): qty 2

## 2021-09-25 NOTE — Progress Notes (Signed)
Bilateral lower extremity venous duplex has been completed. Preliminary results can be found in CV Proc through chart review.  Results were given to the patient's nurse, Nevin Bloodgood.  09/25/21 3:15 PM Carlos Levering RVT

## 2021-09-25 NOTE — Progress Notes (Signed)
   09/25/21 1053  Mobility  Activity Ambulated with assistance in hallway (placed in wheelchair in the hallway)  Level of Assistance Minimal assist, patient does 75% or more  Assistive Device Wheelchair;Front wheel walker  Distance Ambulated (ft) 40 ft  Activity Response Tolerated well  Transport method Wheelchair;Ambulatory  $Mobility charge 1 Mobility   Pt was ambulating w/ RW w/ the nurse tech, wheelchair following. I stepped in & helped with the transfer from [place] to wheelchair and wheeled to the window at the end of the hallway. Let staff know that pt wanted to sit next to the window until time for radiology. Pt requested 2 bags of ice for her shoulder, 2 warm blankets & cup of ice. Sister rolled her back to the room later on in wheelchair.    [facility]

## 2021-09-25 NOTE — Progress Notes (Signed)
Dr Erlinda Hong made aware of preliminary results of venous duplex-DTV found in LLE. No new orders received at this time.

## 2021-09-25 NOTE — Progress Notes (Signed)
PROGRESS NOTE  [name] NTZ:[id number] DOB: Dec 22, 1965   PCP: Pcp, No  Patient is from: Home  DOA: 09/02/2021 LOS: 43  Chief complaints Chief Complaint  Patient presents with   Chest Pain     Brief Narrative / Interim history: 56 year old F with PMH of stage Ib non-small lung cancer s/p RUL lobectomy in 2020 followed by adjuvant treatment, recent diagnosis of widespread metastatic adenocarcinoma with unclear primary, recent hospitalization from 6/28-7/3 for acute respiratory failure with hypoxia in the setting of acute PE and and cancer related pain returning with cancer related pain that was not controlled with home regimen, and admitted for the same.  Patient was discharged on 2 L by [place] last hospitalization.  Patient underwent US-guided biopsy of LUE soft tissue mass on 7/21.  Pathology showed adenocarcinoma of unclear primary.  MRI brain concerning for brain mets and extracranial soft tissue mets.  [facility] consulted per recommendation by oncology.  CT abdomen and pelvis ordered.  Palliative medicine following for pain management.  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  Pain control on 7/29 but now needs EGD/colonoscopy-- had to be off eliquis so heparin started     Subjective:  Sitting up in chair, reports she slept in chair due to back pain Reports back pain not well controlled, it woke her up twice last night, current pain regiment is not adequate Sister who will be the main care giver after discharged is concerning about pain control after discharge, she desires patient's pain is better controlled before discharge  She has bilateral lower extremity edema, reports has been for several months  Objective: Vitals:   10/13/2021 1420 10/01/2021 1440 10/15/2021 2131 09/25/21 0529  BP: 127/61 132/77 128/79 113/60  Pulse: 83 91 99 84  Resp: 19 18 16 18   Temp:  (!) 97.5 F (36.4 C) 98 F (36.7 C) 97.7 F (36.5  C)  TempSrc:  Oral  Oral  SpO2: 99% 100% 99% 100%  Weight:      Height:        Examination:  General: Appearance:     Appear very weak and deconditioned     Lungs:      diminished at bases, respirations unlabored  Heart:    Normal heart rate.   MS: Bilateral lower extremity pitting edema  Neurologic:   Awake, alert, oriented x 3. No apparent focal neurological           defect.            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 -  Diagnosed with widespread mets in 06/2021.  Had liver biopsy in [place] in 6/23 that showed adenocarcinoma with immunoreactivity not typical for lung cancer.   -CT angio chest showed large right suprahilar mass extending continuously into mediastinum and measuring about 6.3 cm maximal diameter with mets to brain, liver, tail of  pancreas, left anterior fifth ribs and multiple vertebral bodies as well as the left breast tissue.  MRI brain concerning for brain mets -Started radiation to the spine and ribs on 7/17>> -Pathology from left arm tissue biopsy on 7/21 shows adenocarcinoma but unclear primary.  -s/p  EGD/colonoscopy after prep complete-- 8/1  Cancer related pain:  -Appreciate help by palliative medicine-adjusting pain meds -Aggressive bowel regimen -significant back pain, not well controlled, palliative care notified, plan to continue goals of care discussion and continue to adjust analgesics    Left arm swelling:  reported on 7/13.  CT left humerus on 7/14 showed 3.7 cm left infraspinatus muscle, 6.2 cm left biceps muscle and other multiple soft tissue masses along the left chest wall and within the left breast soft tissues consistent with metastatic disease  Recent PE/LLE DVT: bilateral PE diagnosed on 08/22/21.  She was discharged on 2 L by [place].  Now on room air. -continue  eliquis  Sinus tachycardia: Likely due to pain and underlying PE.  Resolved. -Continue metoprolol 50 mg twice daily  Anemia of chronic disease: Likely due to malignancy.  Physical deconditioning -Therapy recommended home health.   -Hospital bed, RW and 3 and 1 ordered.  Earwax in left ear: -Debrox eardrops  Bandemia: Likely from malignancy and demargination. Also on steroids -Monitor  Pressure Injury 09/19/21 Buttocks Right;Left Stage 2 -  Partial thickness loss of dermis presenting as a shallow open injury with a red, pink wound bed without slough. 2 areas of full thickness wounds, NOT pressure (Active)  09/19/21 1950  Location: Buttocks  Location Orientation: Right;Left  Staging: Stage 2 -  Partial thickness loss of dermis presenting as a shallow open injury with a red, pink wound bed without slough.  Wound Description (Comments): 2 areas of full thickness wounds, NOT pressure  Present on Admission: No       DVT prophylaxis:  elquis   apixaban (ELIQUIS) tablet 5 mg  Code Status: Full code Family Communication: sister at bedside  Level of care: Med-Surg Needs pain control, continued goals of care discussion  Final disposition: Likely home with [facility] and DME once pain better controlled, will need outpatient palliative care? Hospice?   Consultants:  Radiation oncology Palliative medicine Interventional radiology GI  Sch Meds:  Scheduled Meds:  apixaban  5 mg Oral Q12H   celecoxib  200 mg Oral BID   dexamethasone (DECADRON) injection  8 mg Intravenous Q24H   famotidine  20 mg Oral QHS   fentaNYL  1 patch  Transdermal Q72H   fentaNYL  25 mcg Intravenous UD   gabapentin  200 mg Oral TID   latanoprost  1 drop Both Eyes QHS   lidocaine  1 patch Transdermal Q24H   metoprolol tartrate  50 mg Oral BID   pantoprazole (PROTONIX) IV  40 mg Intravenous Q12H   senna  1 tablet Oral QHS   sodium chloride flush  3 mL Intravenous Q12H   traZODone  100 mg Oral QHS   Zinc Oxide   Topical TID   Continuous Infusions:   PRN Meds:.acetaminophen **OR** acetaminophen, HYDROmorphone (DILAUDID) injection, HYDROmorphone, magnesium hydroxide, ondansetron **OR** ondansetron (ZOFRAN) IV, mouth rinse, polyethylene glycol, senna-docusate  Antimicrobials: Anti-infectives (From admission, onward)    None        I have personally reviewed the following labs and images: CBC: Recent Labs  Lab 09/21/21 1454 09/22/21 0404 09/23/21 0420 10/19/2021 0018 09/25/21 0345  WBC 14.6* 14.7* 15.3* 16.2* 17.8*  HGB 8.5* 8.4* 8.7* 8.8*  9.1*  HCT 26.8* 26.1* 27.7* 28.2* 29.0*  MCV 75.5* 74.8* 76.5* 76.0* 75.9*  PLT 294 276 237 216 192    BMP &GFR Recent Labs  Lab 09/22/21 0404 10/21/2021 0018 09/25/21 0345  NA 139 135 136  K 4.6 4.6 4.5  CL 102 99 99  CO2 24 21* 24  GLUCOSE 97 95 96  BUN 30* 23* 20  CREATININE 0.66 0.54 0.54  CALCIUM 9.2 8.8* 9.2    Estimated Creatinine Clearance: 88.7 mL/min (by C-G formula based on SCr of 0.54 mg/dL). Liver & Pancreas: No results for input(s): "AST", "ALT", "ALKPHOS", "BILITOT", "PROT", "ALBUMIN" in the last 168 hours.  No results for input(s): "LIPASE", "AMYLASE" in the last 168 hours. No results for input(s): "AMMONIA" in the last 168 hours. Diabetic: No results for input(s): "HGBA1C" in the last 72 hours. No results for input(s): "GLUCAP" in the last 168 hours. Cardiac Enzymes: No results for input(s): "CKTOTAL", "CKMB", "CKMBINDEX", "TROPONINI" in the last 168 hours.  No results for input(s): "PROBNP" in the last 8760 hours. Coagulation Profile: Recent Labs   Lab 09/21/21 1454  INR 1.8*   Thyroid Function Tests: No results for input(s): "TSH", "T4TOTAL", "FREET4", "T3FREE", "THYROIDAB" in the last 72 hours.  Lipid Profile: No results for input(s): "CHOL", "HDL", "LDLCALC", "TRIG", "CHOLHDL", "LDLDIRECT" in the last 72 hours. Anemia Panel: No results for input(s): "VITAMINB12", "FOLATE", "FERRITIN", "TIBC", "IRON", "RETICCTPCT" in the last 72 hours. Urine analysis:    Component Value Date/Time   COLORURINE YELLOW 02/14/2010 0600   APPEARANCEUR CLEAR 02/14/2010 0600   LABSPEC 1.028 02/14/2010 0600   PHURINE 5.5 02/14/2010 0600   GLUCOSEU NEGATIVE 02/14/2010 0600   HGBUR NEGATIVE 02/14/2010 0600   BILIRUBINUR LARGE (A) 02/14/2010 0600   KETONESUR NEGATIVE 02/14/2010 0600   PROTEINUR NEGATIVE 02/14/2010 0600   UROBILINOGEN 1.0 02/14/2010 0600   NITRITE NEGATIVE 02/14/2010 0600   LEUKOCYTESUR  02/14/2010 0600    NEGATIVE MICROSCOPIC NOT DONE ON URINES WITH NEGATIVE PROTEIN, BLOOD, LEUKOCYTES, NITRITE, OR GLUCOSE <1000 mg/dL.   Sepsis Labs: Invalid input(s): "PROCALCITONIN", "LACTICIDVEN"  Microbiology: No results found for this or any previous visit (from the past 240 hour(s)).  Radiology Studies: No results found.    Florencia Reasons MD PhD [place]  If 7PM-7AM, please contact night-coverage [email or web address] 09/25/2021, 12:44 PM

## 2021-09-26 ENCOUNTER — Other Ambulatory Visit: Payer: Self-pay

## 2021-09-26 ENCOUNTER — Ambulatory Visit: Payer: BLUE CROSS/BLUE SHIELD

## 2021-09-26 ENCOUNTER — Ambulatory Visit
Admission: RE | Admit: 2021-09-26 | Discharge: 2021-09-26 | Disposition: A | Discharge status: Home / Self Care | Payer: BLUE CROSS/BLUE SHIELD | Source: Ambulatory Visit | Attending: Radiation Oncology | Admitting: Radiation Oncology

## 2021-09-26 ENCOUNTER — Ambulatory Visit: Admit: 2021-09-26 | Payer: BLUE CROSS/BLUE SHIELD

## 2021-09-26 DIAGNOSIS — C799 Secondary malignant neoplasm of unspecified site: Secondary | ICD-10-CM | POA: Insufficient documentation

## 2021-09-26 DIAGNOSIS — R0789 Other chest pain: Secondary | ICD-10-CM | POA: Diagnosis not present

## 2021-09-26 DIAGNOSIS — C3411 Malignant neoplasm of upper lobe, right bronchus or lung: Secondary | ICD-10-CM | POA: Insufficient documentation

## 2021-09-26 LAB — URINALYSIS, ROUTINE W REFLEX MICROSCOPIC
Bilirubin Urine: NEGATIVE
Glucose, UA: NEGATIVE mg/dL
Ketones, ur: 5 mg/dL — AB
Nitrite: NEGATIVE
Protein, ur: 30 mg/dL — AB
Specific Gravity, Urine: 1.021 (ref 1.005–1.030)
pH: 5 (ref 5.0–8.0)

## 2021-09-26 LAB — RAD ONC ARIA SESSION SUMMARY
Course Elapsed Days: 17
Plan Fractions Treated to Date: 1
Plan Prescribed Dose Per Fraction: 3 Gy
Plan Total Fractions Prescribed: 10
Plan Total Prescribed Dose: 30 Gy
Reference Point Dosage Given to Date: 3 Gy
Reference Point Session Dosage Given: 3 Gy
Session Number: 9

## 2021-09-26 MED ORDER — DIAZEPAM 5 MG/ML IJ SOLN
5.0000 mg | INTRAMUSCULAR | Status: DC
  Administered 2021-09-26: 5 mg via INTRAVENOUS
  Filled 2021-09-26: qty 2

## 2021-09-26 MED ORDER — FENTANYL CITRATE PF 50 MCG/ML IJ SOSY
100.0000 ug | PREFILLED_SYRINGE | INTRAMUSCULAR | Status: AC
  Administered 2021-09-26: 100 ug via INTRAVENOUS
  Filled 2021-09-26: qty 2

## 2021-09-26 NOTE — Progress Notes (Signed)
  Radiation Oncology         ([phone]) [phone] ________________________________  Name: [name] MRN: [id number]  Date: 09/05/2021  DOB: 1966/01/06  Chart Note:  This patient is currently receiving radiation therapy and I wanted to take a minute to update our current status.  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  She completed 8 out of 10 of those most recently on 09/20/21.  She was subsequently diagnosed with numerous brain metastases.  So, we attempted to coordinate whole brain radiation to begin on Monday.  However, at that time, she became unable to lay supine on the treatment table due to pain.  After 2 attempts to set-up whole brain radiation and continue her chest radiation, she has been unable to lay flat.  We have localized her pain to the left groin and it appears to correlate with a mass seen on recent pelvic CT.  So, today, we will try to set-up palliative radiation to the left groin lesion in hopes of alleviating that pain to potentially continue palliative XRT to other sites.  Although she has widespread metastatic disease with now a prolonged complicated hospitalization, we remain hopeful that she is still EGFR positive on recent biceps biopsy, which would predict favorable response to well-tolerated targeted therapy in the future.  The prognosis of stage IV EGFR lung cancer is much better than stage IV EGFR negative lung cancer.  However, given the complexity of her disease and current performance status, her short term prognosis remains unpredictable.  Here is the lesion that we will attempt to treat today.   ________________________________  Sheral Apley. [name], M.D.

## 2021-09-26 NOTE — Progress Notes (Signed)
PROGRESS NOTE  [name] WFU:[id number] DOB: 1965/05/19   PCP: Pcp, No  Patient is from: Home  DOA: 08/25/2021 LOS: 64  Chief complaints Chief Complaint  Patient presents with   Chest Pain     Brief Narrative / Interim history: 56 year old F with PMH of stage Ib non-small lung cancer s/p RUL lobectomy in 2020 followed by adjuvant treatment, recent diagnosis of widespread metastatic adenocarcinoma with unclear primary, recent hospitalization from 6/28-7/3 for acute respiratory failure with hypoxia in the setting of acute PE and and cancer related pain returning with cancer related pain that was not controlled with home regimen, and admitted for the same.  Patient was discharged on 2 L by [place] last hospitalization.  Patient underwent US-guided biopsy of LUE soft tissue mass on 7/21.  Pathology showed adenocarcinoma of unclear primary.  MRI brain concerning for brain mets and extracranial soft tissue mets.  [facility] consulted per recommendation by oncology.  CT abdomen and pelvis ordered.  Palliative medicine following for pain management.  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  Pain control on 7/29 but now needs EGD/colonoscopy-- had to be off eliquis so heparin started     Subjective:  Sitting up in chair, reports she slept in chair due to back pain Currently appear comfortable sitting in chair, reports pain is not tolerable during XRT treatment Sister at bedside    Objective: Vitals:   09/25/21 1514 09/25/21 2132 09/26/21 0529 09/26/21 1405  BP: 115/75 122/73 124/77 118/79  Pulse: 89 88 90 87  Resp:  18 18   Temp: 98.1 F (36.7 C) 98.2 F (36.8 C) 97.8 F (36.6 C) 98 F (36.7 C)  TempSrc: Oral Oral Oral Oral  SpO2: 97% 100% 98% 98%  Weight:      Height:        Examination:  General: Appearance:     Appear weak and deconditioned     Lungs:      diminished at bases, respirations unlabored   Heart:    Normal heart rate.   MS: Bilateral lower extremity pitting edema  Neurologic:   Awake, alert, oriented x 3. No apparent focal neurological           defect.            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 -  Diagnosed with widespread mets in 06/2021.  Had liver biopsy in [place] in 6/23 that showed adenocarcinoma with immunoreactivity not typical for lung cancer.   -CT angio chest showed large right suprahilar mass extending continuously into mediastinum and measuring about 6.3 cm maximal diameter with mets to brain, liver, tail of pancreas, left anterior fifth ribs and multiple vertebral bodies as well as the left breast tissue.  MRI brain concerning for brain mets -Started radiation to the spine and ribs on 7/17>> -Pathology from left arm tissue biopsy  on 7/21 shows adenocarcinoma but unclear primary.  -s/p  EGD/colonoscopy after prep complete-- 8/1  Cancer related pain:  -Appreciate help by palliative medicine-adjusting pain meds -Aggressive bowel regimen -significant back pain , not able to tolerate XRT well due to pain - palliative care input greatly appreciated, palliative care to continue to adjust analgesics and continue  goals of care discussion    Left arm swelling: reported on 7/13.  CT left humerus on 7/14 showed 3.7 cm left infraspinatus muscle, 6.2 cm left biceps muscle and other multiple soft tissue masses along the left chest  wall and within the left breast soft tissues consistent with metastatic disease  Recent PE/LLE DVT: bilateral PE diagnosed on 08/22/21.  She was discharged on 2 L by [place].  Now on room air. -continue  eliquis  Sinus tachycardia: Likely due to pain and underlying PE.  Resolved. -Continue metoprolol 50 mg twice daily  Anemia of chronic disease: Likely due to malignancy.  Physical deconditioning -Therapy recommended home health.   -Hospital bed, RW and 3 and 1 ordered. -plan to go to sister's house  at discharge once pain is better controlled   Earwax in left ear: -Debrox eardrops  Bandemia: Likely from malignancy and demargination. Also on steroids -Monitor  Pressure Injury 09/19/21 Buttocks Right;Left Stage 2 -  Partial thickness loss of dermis presenting as a shallow open injury with a red, pink wound bed without slough. 2 areas of full thickness wounds, NOT pressure (Active)  09/19/21 1950  Location: Buttocks  Location Orientation: Right;Left  Staging: Stage 2 -  Partial thickness loss of dermis presenting as a shallow open injury with a red, pink wound bed without slough.  Wound Description (Comments): 2 areas of full thickness wounds, NOT pressure  Present on Admission: No       DVT prophylaxis:  elquis   apixaban (ELIQUIS) tablet 5 mg  Code Status: Full code Family Communication: sister at bedside  Level of care: Med-Surg Needs pain control, continued goals of care discussion  Final disposition: Likely home with HH and DME once pain better controlled, will need outpatient palliative care   Consultants:  Radiation oncology Palliative medicine Interventional radiology GI  Sch Meds:  Scheduled Meds:  acetaminophen  1,000 mg Oral TID   apixaban  5 mg Oral Q12H   celecoxib  200 mg Oral BID   dexamethasone (DECADRON) injection  8 mg Intravenous Q24H   diazepam  5 mg Intravenous UD   famotidine  20 mg Oral QHS   fentaNYL  1 patch Transdermal Q72H   gabapentin   400 mg Oral TID   latanoprost  1 drop Both Eyes QHS   lidocaine  1 patch Transdermal Q24H   metoprolol tartrate  50 mg Oral BID   pantoprazole (PROTONIX) IV  40 mg Intravenous Q12H   senna  1 tablet Oral QHS   sodium chloride flush  3 mL Intravenous Q12H   traZODone  100 mg Oral QHS   Zinc Oxide   Topical TID   Continuous Infusions:   PRN Meds:.acetaminophen **OR** acetaminophen, HYDROmorphone (DILAUDID) injection, HYDROmorphone, magnesium hydroxide, ondansetron **OR** ondansetron (ZOFRAN) IV, mouth rinse, polyethylene glycol, senna-docusate  Antimicrobials: Anti-infectives (From admission, onward)    None        I have personally reviewed the following labs and images: CBC: Recent Labs  Lab 09/21/21 1454 09/22/21 0404 09/23/21 0420 10/15/2021 0018 09/25/21 0345  WBC 14.6* 14.7* 15.3* 16.2* 17.8*  HGB 8.5* 8.4* 8.7* 8.8* 9.1*  HCT 26.8*  26.1* 27.7* 28.2* 29.0*  MCV 75.5* 74.8* 76.5* 76.0* 75.9*  PLT 294 276 237 216 192    BMP &GFR Recent Labs  Lab 09/22/21 0404 10/04/2021 0018 09/25/21 0345  NA 139 135 136  K 4.6 4.6 4.5  CL 102 99 99  CO2 24 21* 24  GLUCOSE 97 95 96  BUN 30* 23* 20  CREATININE 0.66 0.54 0.54  CALCIUM 9.2 8.8* 9.2    Estimated Creatinine Clearance: 88.7 mL/min (by C-G formula based on SCr of 0.54 mg/dL). Liver & Pancreas: No results for input(s): "AST", "ALT", "ALKPHOS", "BILITOT", "PROT", "ALBUMIN" in the last 168 hours.  No results for input(s): "LIPASE", "AMYLASE" in the last 168 hours. No results for input(s): "AMMONIA" in the last 168 hours. Diabetic: No results for input(s): "HGBA1C" in the last 72 hours. No results for input(s): "GLUCAP" in the last 168 hours. Cardiac Enzymes: No results for input(s): "CKTOTAL", "CKMB", "CKMBINDEX", "TROPONINI" in the last 168 hours.  No results for input(s): "PROBNP" in the last 8760 hours. Coagulation Profile: Recent Labs  Lab 09/21/21 1454  INR 1.8*   Thyroid Function Tests: No results  for input(s): "TSH", "T4TOTAL", "FREET4", "T3FREE", "THYROIDAB" in the last 72 hours.  Lipid Profile: No results for input(s): "CHOL", "HDL", "LDLCALC", "TRIG", "CHOLHDL", "LDLDIRECT" in the last 72 hours. Anemia Panel: No results for input(s): "VITAMINB12", "FOLATE", "FERRITIN", "TIBC", "IRON", "RETICCTPCT" in the last 72 hours. Urine analysis:    Component Value Date/Time   COLORURINE AMBER (A) 09/26/2021 0019   APPEARANCEUR CLOUDY (A) 09/26/2021 0019   LABSPEC 1.021 09/26/2021 0019   PHURINE 5.0 09/26/2021 0019   GLUCOSEU NEGATIVE 09/26/2021 0019   HGBUR SMALL (A) 09/26/2021 0019   BILIRUBINUR NEGATIVE 09/26/2021 0019   KETONESUR 5 (A) 09/26/2021 0019   PROTEINUR 30 (A) 09/26/2021 0019   UROBILINOGEN 1.0 02/14/2010 0600   NITRITE NEGATIVE 09/26/2021 0019   LEUKOCYTESUR MODERATE (A) 09/26/2021 0019   Sepsis Labs: Invalid input(s): "PROCALCITONIN", "LACTICIDVEN"  Microbiology: No results found for this or any previous visit (from the past 240 hour(s)).  Radiology Studies: No results found.    Florencia Reasons MD PhD [place]  If 7PM-7AM, please contact night-coverage [email or web address] 09/26/2021, 3:56 PM

## 2021-09-26 NOTE — Progress Notes (Signed)
Patient continues to have severe pain that limits her ability to tolerate radiation treatment. Her issues are related specifically to positioning on table and pressure on all of her soft tissue masses. Will change pre-treatment regimen to IV valium and fentanyl. Orders place for use today for afternoon tx.   I also discussed her goals of care today and got a sense of her willingness to discuss cancer progression and prognosis. We talked about having conversations with her loved ones about end of life issues as well as hope. At this time the medical oncology plan is unknown-will request Dr. Earlie Server weigh in on prognosis and options.  Lane Hacker, DO Palliative Medicine

## 2021-09-26 NOTE — Progress Notes (Signed)
Adjustments made to patients current pain regimen- attempting to transition her to a home regimen. Increased opioid requirements today.  Increased PO dilauidid to 8mg  q4prn Fentanyl TD 25 Scheduled Tylenol Celebrex 200 BID Decadron 4mg  daily Increased gabapentin to 400mg  TID  Given her extensive metastatic disease she may need methadone for pain control.  Will discuss goals of care with patient tomorrow with better symptom control.  Lane Hacker, DO Palliative Medicine

## 2021-09-26 NOTE — Progress Notes (Addendum)
  Radiation Oncology         [phone] ________________________________  Name: [name] MRN: [id number]  Date: 09/26/2021  DOB: August 09, 1965  INPATIENT  SIMULATION AND TREATMENT PLANNING NOTE    ICD-10-CM   1. Metastatic adenocarcinoma ([place])  C79.9     2. Primary cancer of right upper lobe of lung (HCC)  C34.11       DIAGNOSIS:  56 yo woman with painful left groin adductor metastasis from EGFR-positive right upper lung cancer  NARRATIVE:  The patient was brought to the [facility].  Identity was confirmed.  All relevant records and images related to the planned course of therapy were reviewed.  The patient freely provided informed written consent to proceed with treatment after reviewing the details related to the planned course of therapy. The consent form was witnessed and verified by the simulation staff.  Then, the patient was set-up in a stable reproducible  supine position for radiation therapy.  CT images were obtained.  Surface markings were placed.  The CT images were loaded into the planning software.  Then the target and avoidance structures were contoured.  Treatment planning then occurred.  The radiation prescription was entered and confirmed.  Then, I designed and supervised the construction of a total of multiple medically necessary complex treatment devices documented in the ARIA planning notes.  I have requested : 3D Simulation  I have requested a DVH of the following structures: bladder, rectum, target and femoral necks.   PLAN:  The patient will receive 30 Gy in 10 fractions starting later today to the left groin mass.  ________________________________  Sheral Apley [name], M.D.

## 2021-09-27 ENCOUNTER — Ambulatory Visit: Payer: BLUE CROSS/BLUE SHIELD

## 2021-09-27 ENCOUNTER — Ambulatory Visit
Admit: 2021-09-27 | Discharge: 2021-09-27 | Disposition: A | Discharge status: Home / Self Care | Payer: BLUE CROSS/BLUE SHIELD | Attending: Radiation Oncology | Admitting: Radiation Oncology

## 2021-09-27 ENCOUNTER — Other Ambulatory Visit: Payer: Self-pay

## 2021-09-27 DIAGNOSIS — R0789 Other chest pain: Secondary | ICD-10-CM | POA: Diagnosis not present

## 2021-09-27 LAB — RAD ONC ARIA SESSION SUMMARY
Course Elapsed Days: 18
Plan Fractions Treated to Date: 2
Plan Prescribed Dose Per Fraction: 3 Gy
Plan Total Fractions Prescribed: 10
Plan Total Prescribed Dose: 30 Gy
Reference Point Dosage Given to Date: 6 Gy
Reference Point Session Dosage Given: 3 Gy
Session Number: 10

## 2021-09-27 LAB — URINE CULTURE: Culture: 80000 — AB

## 2021-09-27 NOTE — Progress Notes (Signed)
ANTICOAGULATION CONSULT NOTE - Follow Up Consult  Pharmacy Consult for Eliquis Indication: DVT/PE  Allergies  Allergen Reactions   Fish Allergy Hives   Penicillins Hives   Augmentin [Amoxicillin-Pot Clavulanate] Hives   Betadine [Povidone Iodine] Hives and Other (See Comments)    Bad hives, per pt   Shellfish-Derived Products Hives   Tetanus Toxoids Hives    Patient Measurements: Height: 5\' 9"  (175.3 cm) Weight: 83.9 kg (184 lb 15.5 oz) IBW/kg (Calculated) : 66.2  Vital Signs: Temp: 98.2 F (36.8 C) (08/04 0543) Temp Source: Oral (08/04 0543) BP: 111/70 (08/04 0543) Pulse Rate: 101 (08/04 0543)  Labs: Recent Labs    09/25/21 0345  HGB 9.1*  HCT 29.0*  PLT 192  CREATININE 0.54    Estimated Creatinine Clearance: 88.7 mL/min (by C-G formula based on SCr of 0.54 mg/dL).   Assessment: AC/Heme: Recent PE on 08/22/21 on Eliquis 5mg  bid (LD 7/29 AM)>>IV heparin for EGD/colonoscopy>Eliquis resumed 8/1. - Hgb 9.1 (rising), WBC 192 (declining)  - 08/22/21 chest CT: bilateral segmental and subsegmental pulmonary emboli. Mass in lung with lesions in liver and pancreas.  - 8/2: Dopplers: + age indeterminate DVT  Goal of Therapy:  Therapeutic oral anticoagulation  Plan:  Eliquis 5mg  BID Pharmacy will sign off. Please reconsult for further dosing assitance. Will follow peripherally  Ezme Duch S. Alford Highland, PharmD, BCPS Clinical Staff Pharmacist [email or web address]  Alford Highland, [place] 09/27/2021,8:06 AM

## 2021-09-27 NOTE — Progress Notes (Signed)
PROGRESS NOTE  [name] ZOX:[id number] DOB: [birth year]   PCP: Pcp, No  Patient is from: Home  DOA: 09/20/2021 LOS: 46  Chief complaints Chief Complaint  Patient presents with   Chest Pain     Brief Narrative / Interim history: 56 year old F with PMH of stage Ib non-small lung cancer s/p RUL lobectomy in 2020 followed by adjuvant treatment, recent diagnosis of widespread metastatic adenocarcinoma with unclear primary, recent hospitalization from 6/28-7/3 for acute respiratory failure with hypoxia in the setting of acute PE and and cancer related pain returning with cancer related pain that was not controlled with home regimen, and admitted for the same.  Patient was discharged on 2 L by [place] last hospitalization.  Patient underwent US-guided biopsy of LUE soft tissue mass on 7/21.  Pathology showed adenocarcinoma of unclear primary.  MRI brain concerning for brain mets and extracranial soft tissue mets.  [facility] consulted per recommendation by oncology.  CT abdomen and pelvis ordered.  Palliative medicine following for pain management.  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  Pain control on 7/29 but now needs EGD/colonoscopy-- had to be off eliquis so heparin started     Subjective:  Sitting up in chair, reports she has been sleeping  in chair due to back pain for a while This morning , she appear comfortable, she said had better time going through XRT yesterday, she will see how she does today  Son at bedside     Objective: Vitals:   09/26/21 0529 09/26/21 1405 09/26/21 2130 09/27/21 0543  BP: 124/77 118/79 135/84 111/70  Pulse: 90 87 (!) 103 (!) 101  Resp: 18  16 16   Temp: 97.8 F (36.6 C) 98 F (36.7 C) 98 F (36.7 C) 98.2 F (36.8 C)  TempSrc: Oral Oral Oral Oral  SpO2: 98% 98% 99% 98%  Weight:      Height:        Examination:  General: Appearance:     Appear weak and deconditioned      Lungs:      diminished at bases, respirations unlabored  Heart:    Tachycardic. Heart rate 101-103  MS: Bilateral lower extremity pitting edema  Neurologic:   Awake, alert, oriented x 3. No apparent focal neurological           defect.            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 -  Diagnosed with widespread mets in 06/2021.  Had liver biopsy in [place] in 6/23 that showed adenocarcinoma with immunoreactivity not typical for lung cancer.   -CT angio chest showed large right suprahilar mass extending continuously into mediastinum and measuring about 6.3 cm maximal diameter with mets to brain, liver, tail of pancreas, left anterior fifth ribs and multiple vertebral bodies as well as the left breast tissue.  MRI brain concerning  for brain mets -Started radiation to the spine and ribs on 7/17>> -Pathology from left arm tissue biopsy on 7/21 shows adenocarcinoma but unclear primary.  -s/p  EGD/colonoscopy after prep complete-- 8/1  Cancer related pain:  -Appreciate help by palliative medicine-adjusting pain meds -Aggressive bowel regimen -significant back pain , not able to tolerate XRT well due to pain - palliative care input greatly appreciated, palliative care to continue to adjust analgesics and continue  goals of care discussion    Left arm swelling: reported on 7/13.  CT left humerus on 7/14 showed 3.7 cm left infraspinatus muscle, 6.2 cm left  biceps muscle and other multiple soft tissue masses along the left chest wall and within the left breast soft tissues consistent with metastatic disease  Recent PE/LLE DVT: bilateral PE diagnosed on 08/22/21.  She was discharged on 2 L by [place].  Now on room air. -continue  eliquis  Sinus tachycardia: Likely due to pain and underlying PE.  Resolved. -Continue metoprolol 50 mg twice daily  Anemia of chronic disease: Likely due to malignancy.  Physical deconditioning -Therapy recommended home health.   -Hospital bed, RW and 3 and 1 ordered. -plan to go to sister's house  at discharge once pain is better controlled   Earwax in left ear: -Debrox eardrops  Bandemia: Likely from malignancy and demargination. Also on steroids -Monitor  Pressure Injury 09/19/21 Buttocks Right;Left Stage 2 -  Partial thickness loss of dermis presenting as a shallow open injury with a red, pink wound bed without slough. 2 areas of full thickness wounds, NOT pressure (Active)  09/19/21 1950  Location: Buttocks  Location Orientation: Right;Left  Staging: Stage 2 -  Partial thickness loss of dermis presenting as a shallow open injury with a red, pink wound bed without slough.  Wound Description (Comments): 2 areas of full thickness wounds, NOT pressure  Present on Admission: No       DVT prophylaxis:  elquis   apixaban (ELIQUIS) tablet 5 mg  Code Status: Full code Family Communication: son at bedside  Level of care: Med-Surg Needs pain control, continued goals of care discussion  Final disposition: Likely home with HH and DME once pain better controlled, will need outpatient palliative care   Consultants:  Radiation oncology Palliative medicine Interventional radiology GI  Sch Meds:  Scheduled Meds:  acetaminophen  1,000 mg Oral TID   apixaban  5 mg Oral Q12H   celecoxib  200 mg Oral BID   dexamethasone (DECADRON) injection  8 mg Intravenous Q24H   diazepam  5 mg Intravenous UD   famotidine   20 mg Oral QHS   fentaNYL  1 patch Transdermal Q72H   gabapentin  400 mg Oral TID   latanoprost  1 drop Both Eyes QHS   lidocaine  1 patch Transdermal Q24H   metoprolol tartrate  50 mg Oral BID   pantoprazole (PROTONIX) IV  40 mg Intravenous Q12H   senna  1 tablet Oral QHS   sodium chloride flush  3 mL Intravenous Q12H   traZODone  100 mg Oral QHS   Zinc Oxide   Topical TID   Continuous Infusions:   PRN Meds:.acetaminophen **OR** acetaminophen, HYDROmorphone (DILAUDID) injection, HYDROmorphone, magnesium hydroxide, ondansetron **OR** ondansetron (ZOFRAN) IV, mouth rinse, polyethylene glycol, senna-docusate  Antimicrobials: Anti-infectives (From admission, onward)    None        I have personally reviewed the following labs and images: CBC: Recent Labs  Lab 09/21/21 1454 09/22/21 0404 09/23/21 0420 10/11/2021 0018 09/25/21  0345  WBC 14.6* 14.7* 15.3* 16.2* 17.8*  HGB 8.5* 8.4* 8.7* 8.8* 9.1*  HCT 26.8* 26.1* 27.7* 28.2* 29.0*  MCV 75.5* 74.8* 76.5* 76.0* 75.9*  PLT 294 276 237 216 192    BMP &GFR Recent Labs  Lab 09/22/21 0404 10/17/2021 0018 09/25/21 0345  NA 139 135 136  K 4.6 4.6 4.5  CL 102 99 99  CO2 24 21* 24  GLUCOSE 97 95 96  BUN 30* 23* 20  CREATININE 0.66 0.54 0.54  CALCIUM 9.2 8.8* 9.2    Estimated Creatinine Clearance: 88.7 mL/min (by C-G formula based on SCr of 0.54 mg/dL). Liver & Pancreas: No results for input(s): "AST", "ALT", "ALKPHOS", "BILITOT", "PROT", "ALBUMIN" in the last 168 hours.  No results for input(s): "LIPASE", "AMYLASE" in the last 168 hours. No results for input(s): "AMMONIA" in the last 168 hours. Diabetic: No results for input(s): "HGBA1C" in the last 72 hours. No results for input(s): "GLUCAP" in the last 168 hours. Cardiac Enzymes: No results for input(s): "CKTOTAL", "CKMB", "CKMBINDEX", "TROPONINI" in the last 168 hours.  No results for input(s): "PROBNP" in the last 8760 hours. Coagulation Profile: Recent Labs   Lab 09/21/21 1454  INR 1.8*   Thyroid Function Tests: No results for input(s): "TSH", "T4TOTAL", "FREET4", "T3FREE", "THYROIDAB" in the last 72 hours.  Lipid Profile: No results for input(s): "CHOL", "HDL", "LDLCALC", "TRIG", "CHOLHDL", "LDLDIRECT" in the last 72 hours. Anemia Panel: No results for input(s): "VITAMINB12", "FOLATE", "FERRITIN", "TIBC", "IRON", "RETICCTPCT" in the last 72 hours. Urine analysis:    Component Value Date/Time   COLORURINE AMBER (A) 09/26/2021 0019   APPEARANCEUR CLOUDY (A) 09/26/2021 0019   LABSPEC 1.021 09/26/2021 0019   PHURINE 5.0 09/26/2021 0019   GLUCOSEU NEGATIVE 09/26/2021 0019   HGBUR SMALL (A) 09/26/2021 0019   BILIRUBINUR NEGATIVE 09/26/2021 0019   KETONESUR 5 (A) 09/26/2021 0019   PROTEINUR 30 (A) 09/26/2021 0019   UROBILINOGEN 1.0 02/14/2010 0600   NITRITE NEGATIVE 09/26/2021 0019   LEUKOCYTESUR MODERATE (A) 09/26/2021 0019   Sepsis Labs: Invalid input(s): "PROCALCITONIN", "LACTICIDVEN"  Microbiology: No results found for this or any previous visit (from the past 240 hour(s)).  Radiology Studies: No results found.    Florencia Reasons MD PhD [place]  If 7PM-7AM, please contact night-coverage [email or web address] 09/27/2021, 10:00 AM

## 2021-09-28 DIAGNOSIS — R0789 Other chest pain: Secondary | ICD-10-CM | POA: Diagnosis not present

## 2021-09-28 MED ORDER — DIAZEPAM 5 MG/ML IJ SOLN
2.5000 mg | INTRAMUSCULAR | Status: DC
Start: 2021-09-28 — End: 2021-10-01

## 2021-09-28 NOTE — Progress Notes (Signed)
PROGRESS NOTE  [name] ERX:[id number] DOB: [birth year]   PCP: Pcp, No  Patient is from: Home  DOA: 09/02/2021 LOS: 24  Chief complaints Chief Complaint  Patient presents with   Chest Pain     Brief Narrative / Interim history: 56 year old F with PMH of stage Ib non-small lung cancer s/p RUL lobectomy in 2020 followed by adjuvant treatment, recent diagnosis of widespread metastatic adenocarcinoma with unclear primary, recent hospitalization from 6/28-7/3 for acute respiratory failure with hypoxia in the setting of acute PE and and cancer related pain returning with cancer related pain that was not controlled with home regimen, and admitted for the same.  Patient was discharged on 2 L by [facility] last hospitalization.  Patient underwent US-guided biopsy of LUE soft tissue mass on 7/21.  Pathology showed adenocarcinoma of unclear primary.  MRI brain concerning for brain mets and extracranial soft tissue mets.  [facility] consulted per recommendation by oncology.  CT abdomen and pelvis ordered.  Palliative medicine following for pain management.  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  Pain control on 7/29 but now needs EGD/colonoscopy-- had to be off eliquis so heparin started     Subjective:  Sitting up in chair, reports she has been sleeping  in chair due to back pain for a while This morning , she appears comfortable,  Still not able to finish XRT due to pain laying on table, palliative care is working on pain control  family at bedside     Objective: Vitals:   09/27/21 2119 09/28/21 0525 09/28/21 0530 09/28/21 1345  BP: 124/80 123/69 123/69 102/61  Pulse: (!) 102 (!) 109 (!) 109 94  Resp: 16 17 18 16   Temp: 98.5 F (36.9 C) 98.5 F (36.9 C) 98.5 F (36.9 C) 97.9 F (36.6 C)  TempSrc: Oral Oral Oral Oral  SpO2: 96% 97% 98% 96%  Weight:      Height:        Examination:  General: Appearance:      Appear weak and deconditioned     Lungs:      diminished at bases, respirations unlabored  Heart:    Slight tachycardia   MS: Bilateral lower extremity pitting edema has improved   Neurologic:   Awake, alert, oriented x 3. No apparent focal neurological           defect.            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 -  Diagnosed with widespread mets in 06/2021.  Had liver biopsy in [place] in 6/23 that showed adenocarcinoma with immunoreactivity not typical for lung cancer.   -CT angio chest showed large right suprahilar mass extending continuously into mediastinum and measuring about 6.3 cm maximal diameter with mets to brain, liver, tail of pancreas, left anterior fifth ribs and multiple vertebral bodies as well as the  left breast tissue.  MRI brain concerning for brain mets -Started radiation to the spine and ribs on 7/17>> -Pathology from left arm tissue biopsy on 7/21 shows adenocarcinoma but unclear primary.  -s/p  EGD/colonoscopy after prep complete-- 8/1 -she states Dr Julien Nordmann is her oncologist   Cancer related pain:  --significant back pain , not able to tolerate XRT well due to pain - palliative care input greatly appreciated, palliative care to continue to adjust analgesics and continue  goals of care discussion     Left arm swelling: reported on 7/13.  CT left humerus on 7/14 showed 3.7 cm left infraspinatus muscle, 6.2 cm left biceps  muscle and other multiple soft tissue masses along the left chest wall and within the left breast soft tissues consistent with metastatic disease  Recent PE/LLE DVT: bilateral PE diagnosed on 08/22/21.  She was discharged on 2 L by [facility].  Now on room air. -continue  eliquis  Sinus tachycardia: Likely due to pain and underlying PE.  Resolved. -Continue metoprolol 50 mg twice daily  Bandemia: Likely from malignancy and demargination.  on steroids -does not appear to have infection, Monitor  Anemia of chronic disease: Likely due to malignancy.  Physical deconditioning -Therapy recommended home health.   -Hospital bed, RW and 3 and 1 ordered. -plan to go to sister's house  at discharge once pain is better controlled     Pressure Injury 09/19/21 Buttocks Right;Left Stage 2 -  Partial thickness loss of dermis presenting as a shallow open injury with a red, pink wound bed without slough. 2 areas of full thickness wounds, NOT pressure (Active)  09/19/21 1950  Location: Buttocks  Location Orientation: Right;Left  Staging: Stage 2 -  Partial thickness loss of dermis presenting as a shallow open injury with a red, pink wound bed without slough.  Wound Description (Comments): 2 areas of full thickness wounds, NOT pressure  Present on Admission: No       DVT prophylaxis:  elquis   apixaban (ELIQUIS) tablet 5 mg  Code Status: Full code Family Communication: son at bedside   Needs pain control, continued goals of care discussion  Final disposition: Likely home with HH and DME once pain better controlled, will need outpatient palliative care   Consultants:  Radiation oncology Palliative medicine Interventional radiology GI  Sch Meds:  Scheduled Meds:  acetaminophen  1,000 mg Oral TID   apixaban  5 mg Oral Q12H   celecoxib  200 mg Oral BID   dexamethasone (DECADRON) injection  8 mg Intravenous Q24H   diazepam  2.5 mg Intravenous UD   famotidine  20 mg Oral QHS   fentaNYL  1  patch Transdermal Q72H   gabapentin  400 mg Oral TID   latanoprost  1 drop Both Eyes QHS   lidocaine  1 patch Transdermal Q24H   metoprolol tartrate  50 mg Oral BID   pantoprazole (PROTONIX) IV  40 mg Intravenous Q12H   senna  1 tablet Oral QHS   sodium chloride flush  3 mL Intravenous Q12H   traZODone  100 mg Oral QHS   Zinc Oxide   Topical TID   Continuous Infusions:   PRN Meds:.acetaminophen **OR** acetaminophen, HYDROmorphone (DILAUDID) injection, HYDROmorphone, magnesium hydroxide, ondansetron **OR** ondansetron (ZOFRAN) IV, mouth rinse, polyethylene glycol, senna-docusate  Antimicrobials: Anti-infectives (From admission, onward)    None        I have personally reviewed the following labs and images: CBC: Recent Labs  Lab 09/22/21 0404 09/23/21 0420 10/22/2021  0018 09/25/21 0345  WBC 14.7* 15.3* 16.2* 17.8*  HGB 8.4* 8.7* 8.8* 9.1*  HCT 26.1* 27.7* 28.2* 29.0*  MCV 74.8* 76.5* 76.0* 75.9*  PLT 276 237 216 192    BMP &GFR Recent Labs  Lab 09/22/21 0404 09/29/2021 0018 09/25/21 0345  NA 139 135 136  K 4.6 4.6 4.5  CL 102 99 99  CO2 24 21* 24  GLUCOSE 97 95 96  BUN 30* 23* 20  CREATININE 0.66 0.54 0.54  CALCIUM 9.2 8.8* 9.2    Estimated Creatinine Clearance: 88.7 mL/min (by C-G formula based on SCr of 0.54 mg/dL). Liver & Pancreas: No results for input(s): "AST", "ALT", "ALKPHOS", "BILITOT", "PROT", "ALBUMIN" in the last 168 hours.  No results for input(s): "LIPASE", "AMYLASE" in the last 168 hours. No results for input(s): "AMMONIA" in the last 168 hours. Diabetic: No results for input(s): "HGBA1C" in the last 72 hours. No results for input(s): "GLUCAP" in the last 168 hours. Cardiac Enzymes: No results for input(s): "CKTOTAL", "CKMB", "CKMBINDEX", "TROPONINI" in the last 168 hours.  No results for input(s): "PROBNP" in the last 8760 hours. Coagulation Profile: No results for input(s): "INR", "PROTIME" in the last 168 hours.  Thyroid Function  Tests: No results for input(s): "TSH", "T4TOTAL", "FREET4", "T3FREE", "THYROIDAB" in the last 72 hours.  Lipid Profile: No results for input(s): "CHOL", "HDL", "LDLCALC", "TRIG", "CHOLHDL", "LDLDIRECT" in the last 72 hours. Anemia Panel: No results for input(s): "VITAMINB12", "FOLATE", "FERRITIN", "TIBC", "IRON", "RETICCTPCT" in the last 72 hours. Urine analysis:    Component Value Date/Time   COLORURINE AMBER (A) 09/26/2021 0019   APPEARANCEUR CLOUDY (A) 09/26/2021 0019   LABSPEC 1.021 09/26/2021 0019   PHURINE 5.0 09/26/2021 0019   GLUCOSEU NEGATIVE 09/26/2021 0019   HGBUR SMALL (A) 09/26/2021 0019   BILIRUBINUR NEGATIVE 09/26/2021 0019   KETONESUR 5 (A) 09/26/2021 0019   PROTEINUR 30 (A) 09/26/2021 0019   UROBILINOGEN 1.0 02/14/2010 0600   NITRITE NEGATIVE 09/26/2021 0019   LEUKOCYTESUR MODERATE (A) 09/26/2021 0019   Sepsis Labs: Invalid input(s): "PROCALCITONIN", "LACTICIDVEN"  Microbiology: Recent Results (from the past 240 hour(s))  Urine Culture     Status: Abnormal   Collection Time: 09/26/21  5:14 PM   Specimen: Urine, Clean Catch  Result Value Ref Range Status   Specimen Description   Final    URINE, CLEAN CATCH Performed at [facility], [place] [address], [place], [place] [address]    Special Requests   Final    NONE Performed at [facility], [place] [address] [place], [place] [address]    Culture (A)  Final    80,000 COLONIES/mL DIPHTHEROIDS(CORYNEBACTERIUM SPECIES) Standardized susceptibility testing for this organism is not available. Performed at [facility], [place] [address], [place], [place] [address]    Report Status 09/27/2021 FINAL  Final    Radiology Studies: No results found.    Florencia Reasons MD PhD [place]  If 7PM-7AM, please contact night-coverage [email or web address] 09/28/2021, 3:54 PM

## 2021-09-29 DIAGNOSIS — R0789 Other chest pain: Secondary | ICD-10-CM | POA: Diagnosis not present

## 2021-09-29 NOTE — Progress Notes (Signed)
Orthopedic Tech Progress Note Patient Details:  [name] February 20, 1966 [id number]  Wrist splint was dropped off to pt room. I showed her how to apply and adjust. I could not apply at that moment as she had an a IV that the brace would interfere with. Spoke with Sarala RN who will change the IV site and apply the brace following this.  Ortho Devices Type of Ortho Device: Velcro wrist forearm splint Ortho Device/Splint Location: For RUE Ortho Device/Splint Interventions: Ordered (at bedside to apply once IV is moved)   Post Interventions Instructions Provided: Care of device, Adjustment of device  Jonathan Kirkendoll Jeri Modena 09/29/2021, 2:32 PM

## 2021-09-29 NOTE — Progress Notes (Signed)
PROGRESS NOTE  [name] ESP:[id number] DOB: December 02, 1965   PCP: Pcp, No  Patient is from: Home  DOA: 09/19/2021 LOS: 72  Chief complaints Chief Complaint  Patient presents with   Chest Pain     Brief Narrative / Interim history: 56 year old F with PMH of stage Ib non-small lung cancer s/p RUL lobectomy in 2020 followed by adjuvant treatment, recent diagnosis of widespread metastatic adenocarcinoma with unclear primary, recent hospitalization from 6/28-7/3 for acute respiratory failure with hypoxia in the setting of acute PE and and cancer related pain returning with cancer related pain that was not controlled with home regimen, and admitted for the same.  Patient was discharged on 2 L by [facility] last hospitalization.  Patient underwent US-guided biopsy of LUE soft tissue mass on 7/21.  Pathology showed adenocarcinoma of unclear primary.  MRI brain concerning for brain mets and extracranial soft tissue mets.  [facility] consulted per recommendation by oncology.  CT abdomen and pelvis ordered.  Palliative medicine following for pain management.  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  Pain control on 7/29 but now needs EGD/colonoscopy-- had to be off eliquis so heparin started     Subjective:  No significant interval changes Sitting up in chair, reports she has been sleeping  in chair due to back pain for a while This morning , she appears comfortable,  Still not able to finish XRT due to pain laying on table, palliative care is working on pain control  family at bedside     Objective: Vitals:   09/28/21 1345 09/28/21 2114 09/29/21 0615 09/29/21 1341  BP: 102/61 115/72 120/74 109/68  Pulse: 94 99 96 82  Resp: 16 18 18 16   Temp: 97.9 F (36.6 C) 98 F (36.7 C) 98.1 F (36.7 C) 98.3 F (36.8 C)  TempSrc: Oral Oral Oral Oral  SpO2: 96% 98% 99% 97%  Weight:      Height:        Examination:  General:  Appearance:     Appear weak and deconditioned     Lungs:      diminished at bases, respirations unlabored  Heart:     tachycardia has resolved, now NRS  MS: Bilateral lower extremity pitting edema has much improved   Neurologic:   Awake, alert, oriented x 3. No apparent focal neurological           defect.            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 -  Diagnosed with widespread mets in 06/2021.  Had liver biopsy in [place] in 6/23 that showed adenocarcinoma with immunoreactivity not typical for lung cancer.   -CT angio chest showed large right suprahilar mass extending continuously into mediastinum and measuring about 6.3 cm maximal diameter with mets to brain, liver, tail of pancreas, left anterior fifth ribs and multiple vertebral  bodies as well as the left breast tissue.  MRI brain concerning for brain mets -Started radiation to the spine and ribs on 7/17>> -Pathology from left arm tissue biopsy on 7/21 shows adenocarcinoma but unclear primary.  -s/p  EGD/colonoscopy after prep complete-- 8/1 -she states Dr Julien Nordmann is her oncologist   Cancer related pain:  --significant back pain , not able to tolerate XRT well due to pain - palliative care input greatly appreciated, palliative care to continue to adjust analgesics and continue  goals of care discussion     Left arm swelling: reported on 7/13.  CT left humerus on 7/14 showed 3.7 cm left  infraspinatus muscle, 6.2 cm left biceps muscle and other multiple soft tissue masses along the left chest wall and within the left breast soft tissues consistent with metastatic disease  Recent PE/LLE DVT: bilateral PE diagnosed on 08/22/21.  She was discharged on 2 L by [facility] [place].  Now on room air. -continue  eliquis  Sinus tachycardia: Likely due to pain and underlying PE.  Resolved. -Continue metoprolol 50 mg twice daily  Bandemia: Likely from malignancy and demargination.  on steroids -does not appear to have infection, Monitor  Anemia of chronic disease: Likely due to malignancy.  Physical deconditioning -Therapy recommended home health.   -Hospital bed, RW and 3 and 1 ordered. -plan to go to sister's house  at discharge once pain is better controlled     Pressure Injury 09/19/21 Buttocks Right;Left Stage 2 -  Partial thickness loss of dermis presenting as a shallow open injury with a red, pink wound bed without slough. 2 areas of full thickness wounds, NOT pressure (Active)  09/19/21 1950  Location: Buttocks  Location Orientation: Right;Left  Staging: Stage 2 -  Partial thickness loss of dermis presenting as a shallow open injury with a red, pink wound bed without slough.  Wound Description (Comments): 2 areas of full thickness wounds, NOT pressure  Present on Admission: No       DVT prophylaxis:  elquis   apixaban (ELIQUIS) tablet 5 mg  Code Status: Full code Family Communication: daughter at bedside   Needs pain control, continued goals of care discussion  Final disposition: Likely home with HH and DME once pain better controlled, will need outpatient palliative care   Consultants:  Radiation oncology Palliative medicine Interventional radiology GI  Sch Meds:  Scheduled Meds:  acetaminophen  1,000 mg Oral TID   apixaban  5 mg Oral Q12H   celecoxib  200 mg Oral BID   dexamethasone (DECADRON) injection  8 mg Intravenous Q24H   diazepam  2.5 mg Intravenous UD    famotidine  20 mg Oral QHS   fentaNYL  1 patch Transdermal Q72H   gabapentin  400 mg Oral TID   latanoprost  1 drop Both Eyes QHS   lidocaine  1 patch Transdermal Q24H   metoprolol tartrate  50 mg Oral BID   pantoprazole (PROTONIX) IV  40 mg Intravenous Q12H   senna  1 tablet Oral QHS   sodium chloride flush  3 mL Intravenous Q12H   traZODone  100 mg Oral QHS   Zinc Oxide   Topical TID   Continuous Infusions:   PRN Meds:.acetaminophen **OR** acetaminophen, HYDROmorphone (DILAUDID) injection, HYDROmorphone, magnesium hydroxide, ondansetron **OR** ondansetron (ZOFRAN) IV, mouth rinse, polyethylene glycol, senna-docusate  Antimicrobials: Anti-infectives (From admission, onward)    None        I have personally reviewed the following labs and images: CBC: Recent Labs  Lab  09/23/21 0420 10/23/2021 0018 09/25/21 0345  WBC 15.3* 16.2* 17.8*  HGB 8.7* 8.8* 9.1*  HCT 27.7* 28.2* 29.0*  MCV 76.5* 76.0* 75.9*  PLT 237 216 192    BMP &GFR Recent Labs  Lab 10/16/2021 0018 09/25/21 0345  NA 135 136  K 4.6 4.5  CL 99 99  CO2 21* 24  GLUCOSE 95 96  BUN 23* 20  CREATININE 0.54 0.54  CALCIUM 8.8* 9.2    Estimated Creatinine Clearance: 88.7 mL/min (by C-G formula based on SCr of 0.54 mg/dL). Liver & Pancreas: No results for input(s): "AST", "ALT", "ALKPHOS", "BILITOT", "PROT", "ALBUMIN" in the last 168 hours.  No results for input(s): "LIPASE", "AMYLASE" in the last 168 hours. No results for input(s): "AMMONIA" in the last 168 hours. Diabetic: No results for input(s): "HGBA1C" in the last 72 hours. No results for input(s): "GLUCAP" in the last 168 hours. Cardiac Enzymes: No results for input(s): "CKTOTAL", "CKMB", "CKMBINDEX", "TROPONINI" in the last 168 hours.  No results for input(s): "PROBNP" in the last 8760 hours. Coagulation Profile: No results for input(s): "INR", "PROTIME" in the last 168 hours.  Thyroid Function Tests: No results for input(s): "TSH",  "T4TOTAL", "FREET4", "T3FREE", "THYROIDAB" in the last 72 hours.  Lipid Profile: No results for input(s): "CHOL", "HDL", "LDLCALC", "TRIG", "CHOLHDL", "LDLDIRECT" in the last 72 hours. Anemia Panel: No results for input(s): "VITAMINB12", "FOLATE", "FERRITIN", "TIBC", "IRON", "RETICCTPCT" in the last 72 hours. Urine analysis:    Component Value Date/Time   COLORURINE AMBER (A) 09/26/2021 0019   APPEARANCEUR CLOUDY (A) 09/26/2021 0019   LABSPEC 1.021 09/26/2021 0019   PHURINE 5.0 09/26/2021 0019   GLUCOSEU NEGATIVE 09/26/2021 0019   HGBUR SMALL (A) 09/26/2021 0019   BILIRUBINUR NEGATIVE 09/26/2021 0019   KETONESUR 5 (A) 09/26/2021 0019   PROTEINUR 30 (A) 09/26/2021 0019   UROBILINOGEN 1.0 02/14/2010 0600   NITRITE NEGATIVE 09/26/2021 0019   LEUKOCYTESUR MODERATE (A) 09/26/2021 0019   Sepsis Labs: Invalid input(s): "PROCALCITONIN", "LACTICIDVEN"  Microbiology: Recent Results (from the past 240 hour(s))  Urine Culture     Status: Abnormal   Collection Time: 09/26/21  5:14 PM   Specimen: Urine, Clean Catch  Result Value Ref Range Status   Specimen Description   Final    URINE, CLEAN CATCH Performed at [facility], [place] [address] [place], [place] [address]    Special Requests   Final    NONE Performed at [facility], [place] [address] [place], [place] [address]    Culture (A)  Final    80,000 COLONIES/mL DIPHTHEROIDS(CORYNEBACTERIUM SPECIES) Standardized susceptibility testing for this organism is not available. Performed at [facility], [place] [address], [place], [place] [address]    Report Status 09/27/2021 FINAL  Final    Radiology Studies: No results found.    Florencia Reasons MD PhD [place]  If 7PM-7AM, please contact night-coverage [email or web address] 09/29/2021, 5:58 PM

## 2021-09-29 NOTE — Evaluation (Signed)
Occupational Therapy Evaluation Patient Details Name: [name] MRN: [id number] DOB: 26-Jun-1965 Today's Date: 09/29/2021   History of Present Illness Patient is a 56 year old female who presented to the hosptial on 7/11 with severe chest pain. 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 on 7/13 patient was found to have LUE edema with CT on 7/14 revealing multiple soft tissue massess alon the left chest wall, 3.7 cm left infraspinatus muscle, and 6.2 com left biceps muscle. OT was orded with new onset of wrist drop on R hand  PMH: bilateral PE 6/29,   Clinical Impression   Patient is a 56 year old female who was admitted for above. Patient was noted to have had increased soft tissues masses with new onset of right wrist drop. MD was consulted and wrist cock up splint was ordered on this date. Patient was provided with double handled cup to improve ability to participate in drinking and red foam added to utensils to increase oral intake. Patient was able to demonstrate ability to use red foam utensils and cup with no spillage on this date. Patient has 24/7 family support in next level of care per patient report. Patient would continue to benefit from skilled OT services at this time while admitted  to address noted deficits in order to improve overall safety and independence in ADLs.       Recommendations for follow up therapy are one component of a multi-disciplinary discharge planning process, led by the attending physician.  Recommendations may be updated based on patient status, additional functional criteria and insurance authorization.   Follow Up Recommendations  No OT follow up    Assistance Recommended at Discharge Frequent or constant Supervision/Assistance  Patient can return home with the following A little help with walking and/or transfers;A little help with bathing/dressing/bathroom;Assistance with cooking/housework;Direct supervision/assist for  financial management;Assist for transportation;Help with stairs or ramp for entrance;Direct supervision/assist for medications management    Functional Status Assessment  Patient has had a recent decline in their functional status and demonstrates the ability to make significant improvements in function in a reasonable and predictable amount of time.  Equipment Recommendations  Other (comment) (light weight built up utensils.)    Recommendations for Other Services       Precautions / Restrictions Precautions Precaution Comments: on O2 2 L, monitor HR Restrictions Weight Bearing Restrictions: No      Mobility Bed Mobility               General bed mobility comments: up in recliner    Transfers                          Balance Overall balance assessment: Modified Independent                                         ADL either performed or assessed with clinical judgement   ADL Overall ADL's : Needs assistance/impaired Eating/Feeding: Minimal assistance Eating/Feeding Details (indicate cue type and reason): with patietn provided with red foam, two handled cup and squeeze ball. patient ws able to scoop and spear fruit in cup with non skid green sheet added to table to stop cup from shifting. patient was educated on using gross grasp v.s. [place] grasp patterns to improve control. patient was able to pick up dobule handled cup with bilateral hands  in handles with palms on sides with MI and take sips of water with no spillage. Grooming: Moderate assistance;Sitting   Upper Body Bathing: Moderate assistance;Sitting   Lower Body Bathing: Sit to/from stand;Moderate assistance   Upper Body Dressing : Moderate assistance;Sitting   Lower Body Dressing: Moderate assistance;Sit to/from stand;Sitting/lateral leans   Toilet Transfer: Min guard;Rolling walker (2 wheels);BSC/3in1 Toilet Transfer Details (indicate cue type and reason): with increased time due  to urgency. Toileting- Clothing Manipulation and Hygiene: Maximal assistance;Sit to/from stand Toileting - Clothing Manipulation Details (indicate cue type and reason): sister does it fdor her at home             Vision Patient Visual Report: No change from baseline       Perception     Praxis      Pertinent Vitals/Pain Pain Assessment Pain Assessment: Faces Faces Pain Scale: Hurts little more Pain Location: B shoulders and back Pain Descriptors / Indicators: Discomfort, Grimacing, Sharp, Shooting Pain Intervention(s): Limited activity within patient's tolerance, Monitored during session, Repositioned     Hand Dominance Right   Extremity/Trunk Assessment Upper Extremity Assessment Upper Extremity Assessment: RUE deficits/detail;LUE deficits/detail RUE Deficits / Details: noted to have no activation of extensors with wrist hanging in wrist drop position. noted to have strong pincer grasp on R thenar emenance. patient able to pick up items but unable to extend wrist actively. patient able to tolerate PROM over 90 degrees of shoulder flexion and abduction with no reports of pain. unable to actively move shoulder about 15 degrees RUE Coordination: decreased fine motor;decreased gross motor LUE Deficits / Details: unable to FF or ABDuct shoulder actively on this side, not able to toelrat PROM. patient able to AROM elbow with no issue. LUE Coordination: decreased fine motor;decreased gross motor   Lower Extremity Assessment Lower Extremity Assessment: Defer to PT evaluation   Cervical / Trunk Assessment Cervical / Trunk Assessment: Normal   Communication Communication Communication: No difficulties   Cognition Arousal/Alertness: Awake/alert Behavior During Therapy: WFL for tasks assessed/performed Overall Cognitive Status: Within Functional Limits for tasks assessed                                       General Comments       Exercises     Shoulder  Instructions      Home Living Family/patient expects to be discharged to:: Private residence Living Arrangements: Other relatives Available Help at Discharge: Family;Available 24 hours/day Type of Home: House Home Access: Stairs to enter [employer] of Steps: 8 Entrance Stairs-Rails: Right;Left Home Layout: One level     Bathroom Shower/Tub: Occupational psychologist: Standard     Home Equipment: Rollator (4 wheels);Shower seat;BSC/3in1          Prior Functioning/Environment Prior Level of Function : Needs assist       Physical Assist : Mobility (physical) Mobility (physical): Gait   Mobility Comments: sister assists witrh gait and rollator, patienmt requesting RW ADLs Comments: sister and daughter will assist at home per patient report        OT Problem List: Decreased range of motion;Decreased coordination;Impaired UE functional use;Decreased safety awareness;Decreased activity tolerance      OT Treatment/Interventions: Self-care/ADL training;Therapeutic exercise;Neuromuscular education;Energy conservation;DME and/or AE instruction;Therapeutic activities;Balance training;Patient/family education    OT Goals(Current goals can be found in the care plan section) Acute Rehab OT Goals Patient Stated Goal: to be  able to eat better OT Goal Formulation: With patient Time For Goal Achievement: 10/13/21 Potential to Achieve Goals: Good  OT Frequency: Min 2X/week    Co-evaluation              AM-PAC OT "6 Clicks" Daily Activity     Outcome Measure Help from another person eating meals?: A Little Help from another person taking care of personal grooming?: A Lot Help from another person toileting, which includes using toliet, bedpan, or urinal?: A Lot Help from another person bathing (including washing, rinsing, drying)?: A Lot Help from another person to put on and taking off regular upper body clothing?: A Lot Help from another person to put on  and taking off regular lower body clothing?: A Lot 6 Click Score: 13   End of Session Equipment Utilized During Treatment: Rolling walker (2 wheels) Nurse Communication: Mobility status  Activity Tolerance: Patient tolerated treatment well Patient left: in chair;with call bell/phone within reach  OT Visit Diagnosis: Unsteadiness on feet (R26.81);Muscle weakness (generalized) (M62.81)                Time: 5364-6803 OT Time Calculation (min): 33 min Charges:  OT General Charges $OT Visit: 1 Visit OT Evaluation $OT Eval Moderate Complexity: 1 Mod OT Treatments $Self Care/Home Management : 8-22 mins  Jackelyn Poling OTR/L, MS Acute Rehabilitation Department Office# [phone] Pager# [phone]   Marcellina Millin 09/29/2021, 12:47 PM

## 2021-09-30 ENCOUNTER — Ambulatory Visit: Payer: BLUE CROSS/BLUE SHIELD

## 2021-09-30 ENCOUNTER — Other Ambulatory Visit: Payer: Self-pay

## 2021-09-30 ENCOUNTER — Ambulatory Visit
Admit: 2021-09-30 | Discharge: 2021-09-30 | Disposition: A | Discharge status: Home / Self Care | Payer: BLUE CROSS/BLUE SHIELD | Attending: Radiation Oncology | Admitting: Radiation Oncology

## 2021-09-30 ENCOUNTER — Encounter: Payer: BLUE CROSS/BLUE SHIELD | Admitting: Student

## 2021-09-30 ENCOUNTER — Encounter: Payer: Self-pay | Admitting: Urology

## 2021-09-30 DIAGNOSIS — R0789 Other chest pain: Secondary | ICD-10-CM | POA: Diagnosis not present

## 2021-09-30 LAB — BASIC METABOLIC PANEL
Anion gap: 13 (ref 5–15)
BUN: 36 mg/dL — ABNORMAL HIGH (ref 6–20)
CO2: 22 mmol/L (ref 22–32)
Calcium: 8.6 mg/dL — ABNORMAL LOW (ref 8.9–10.3)
Chloride: 99 mmol/L (ref 98–111)
Creatinine, Ser: 0.92 mg/dL (ref 0.44–1.00)
GFR, Estimated: >60 mL/min (ref >60)
Glucose, Bld: 106 mg/dL — ABNORMAL HIGH (ref 70–99)
Potassium: 5.5 mmol/L — ABNORMAL HIGH (ref 3.5–5.1)
Sodium: 134 mmol/L — ABNORMAL LOW (ref 135–145)

## 2021-09-30 LAB — CBC WITH DIFFERENTIAL/PLATELET
Abs Immature Granulocytes: 0.53 10*3/uL — ABNORMAL HIGH (ref 0.00–0.07)
Basophils Absolute: 0 10*3/uL (ref 0.0–0.1)
Basophils Relative: 0 %
Eosinophils Absolute: 0 10*3/uL (ref 0.0–0.5)
Eosinophils Relative: 0 %
HCT: 26.7 % — ABNORMAL LOW (ref 36.0–46.0)
Hemoglobin: 8.6 g/dL — ABNORMAL LOW (ref 12.0–15.0)
Immature Granulocytes: 3 %
Lymphocytes Relative: 2 %
Lymphs Abs: 0.3 10*3/uL — ABNORMAL LOW (ref 0.7–4.0)
MCH: 24.1 pg — ABNORMAL LOW (ref 26.0–34.0)
MCHC: 32.2 g/dL (ref 30.0–36.0)
MCV: 74.8 fL — ABNORMAL LOW (ref 80.0–100.0)
Monocytes Absolute: 1 10*3/uL (ref 0.1–1.0)
Monocytes Relative: 6 %
Neutro Abs: 14.8 10*3/uL — ABNORMAL HIGH (ref 1.7–7.7)
Neutrophils Relative %: 89 %
Platelets: 93 10*3/uL — ABNORMAL LOW (ref 150–400)
RBC: 3.57 MIL/uL — ABNORMAL LOW (ref 3.87–5.11)
RDW: 22.2 % — ABNORMAL HIGH (ref 11.5–15.5)
WBC: 16.7 10*3/uL — ABNORMAL HIGH (ref 4.0–10.5)
nRBC: 0.8 % — ABNORMAL HIGH (ref 0.0–0.2)

## 2021-09-30 LAB — RAD ONC ARIA SESSION SUMMARY
Course Elapsed Days: 21
Plan Fractions Treated to Date: 3
Plan Prescribed Dose Per Fraction: 3 Gy
Plan Total Fractions Prescribed: 10
Plan Total Prescribed Dose: 30 Gy
Reference Point Dosage Given to Date: 9 Gy
Reference Point Session Dosage Given: 3 Gy
Session Number: 11

## 2021-09-30 LAB — MAGNESIUM: Magnesium: 2.6 mg/dL — ABNORMAL HIGH (ref 1.7–2.4)

## 2021-09-30 LAB — PHOSPHORUS: Phosphorus: 3.7 mg/dL (ref 2.5–4.6)

## 2021-09-30 MED ORDER — APIXABAN 2.5 MG PO TABS
5.0000 mg | ORAL_TABLET | Freq: Two times a day (BID) | ORAL | Status: DC
  Administered 2021-09-30 – 2021-10-01 (×2): 5 mg via ORAL
  Filled 2021-09-30 (×2): qty 2

## 2021-09-30 MED ORDER — PANTOPRAZOLE SODIUM 40 MG PO TBEC
40.0000 mg | DELAYED_RELEASE_TABLET | Freq: Two times a day (BID) | ORAL | Status: DC
  Administered 2021-09-30 – 2021-10-01 (×2): 40 mg via ORAL
  Filled 2021-09-30 (×2): qty 1

## 2021-09-30 NOTE — Progress Notes (Signed)

## 2021-09-30 NOTE — TOC Progression Note (Signed)
Transition of Care [facility]) - Progression Note   Patient Details  Name: [name] MRN: [id number] Date of Birth: 25-Aug-1965  [facility] CM/SW [place], LCSW Phone Number: 09/30/2021, 10:24 AM  Clinical Narrative: CSW called [facility] clinic to cancel hospital follow up appointment. New appointment will need to be scheduled once patient's discharge date is confirmed.  Expected Discharge Plan: Home/Self Care Barriers to Discharge: No [place] will accept this patient  Expected Discharge Plan and Services Expected Discharge Plan: Home/Self Care Discharge Planning Services: CM Consult Living arrangements for the past 2 months: Single Family Home  Readmission Risk Interventions    09/10/2021    2:32 PM  Readmission Risk Prevention Plan  Transportation Screening Complete  PCP or Specialist Appt within 5-7 Days Complete  Home Care Screening Complete  Medication Review (RN CM) Complete

## 2021-09-30 NOTE — Progress Notes (Signed)
Occupational Therapy Treatment Patient Details Name: [name] MRN: [id number] DOB: 07/25/1965 Today's Date: 09/30/2021   History of present illness Patient is a 56 year old female who presented to the hosptial on 7/11 with severe chest pain. 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 on 7/13 patient was found to have LUE edema with CT on 7/14 revealing multiple soft tissue massess alon the left chest wall, 3.7 cm left infraspinatus muscle, and 6.2 com left biceps muscle. OT was orded with new onset of wrist drop on R hand  PMH: bilateral PE 6/29,   OT comments  Patient's splint was noted to be in place with patient reporting having it in place since last night. Skin was assessed with no irritation or redness noted from splint. Patient was educated on wear times and importance of skin checks. Patient verbalized understanding. Nurse was educated on splint as well. Patient was max A to don/doff brace with education provided with each application. Patient able to pick up objects and use digits more appropriately with wrist brace in place. Wrist brace noted to be on the larger side for patient with sizing listed as "universal" on wrist brace still able to provide support in wrist and make patient more functional at this time. Patient's discharge plan remains appropriate at this time. OT will continue to follow acutely.      Recommendations for follow up therapy are one component of a multi-disciplinary discharge planning process, led by the attending physician.  Recommendations may be updated based on patient status, additional functional criteria and insurance authorization.    Follow Up Recommendations  No OT follow up    Assistance Recommended at Discharge Frequent or constant Supervision/Assistance  Patient can return home with the following  A little help with walking and/or transfers;A little help with bathing/dressing/bathroom;Assistance with  cooking/housework;Direct supervision/assist for financial management;Assist for transportation;Help with stairs or ramp for entrance;Direct supervision/assist for medications management   Equipment Recommendations       Recommendations for Other Services      Precautions / Restrictions Precautions Precaution Comments: on O2 2 L, monitor HR Restrictions Weight Bearing Restrictions: No       Mobility Bed Mobility                    Transfers                         Balance                                           ADL either performed or assessed with clinical judgement   ADL Overall ADL's : Needs assistance/impaired                                       General ADL Comments: patient was seated in recliner chair with splint in place. patient reported having had splint in place since evening hours on 8/6. patient was educated on not keeping splint on 24/7 but takin breaks recommendations for 2 hours on/off were noted in chart when order was placed yesterday. patients splint recommendations were written on board in room and nurse was made aware. patientwas max A to don splint with noted fatigue with atetmpting to velcro one strap into  place. patient was educated on needing two fingers breath between splint and skin. patient verbalized understanding.    Extremity/Trunk Assessment Upper Extremity Assessment RUE Deficits / Details: noted to have splint in place at start of session. removed with no skin irritation. noted to have some edema in distal forearm near elbow.            Vision       Perception     Praxis      Cognition Arousal/Alertness: Awake/alert Behavior During Therapy: WFL for tasks assessed/performed Overall Cognitive Status: Within Functional Limits for tasks assessed                                          Exercises      Shoulder Instructions       General Comments       Pertinent Vitals/ Pain       Pain Assessment Pain Assessment: Faces Faces Pain Scale: Hurts a little bit Pain Descriptors / Indicators: Discomfort Pain Intervention(s): Limited activity within patient's tolerance, Monitored during session  Home Living                                          Prior Functioning/Environment              Frequency           Progress Toward Goals  OT Goals(current goals can now be found in the care plan section)  Progress towards OT goals: Progressing toward goals     Plan Discharge plan remains appropriate    Co-evaluation                 AM-PAC OT "6 Clicks" Daily Activity     Outcome Measure   Help from another person eating meals?: A Little Help from another person taking care of personal grooming?: A Lot Help from another person toileting, which includes using toliet, bedpan, or urinal?: A Little Help from another person bathing (including washing, rinsing, drying)?: A Lot Help from another person to put on and taking off regular upper body clothing?: A Lot Help from another person to put on and taking off regular lower body clothing?: A Lot 6 Click Score: 14    End of Session Equipment Utilized During Treatment: Other (comment) (wrist cock up splint)  OT Visit Diagnosis: Unsteadiness on feet (R26.81);Muscle weakness (generalized) (M62.81)   Activity Tolerance     Patient Left in chair;with call bell/phone within reach   Nurse Communication Mobility status        Time: 7939-0300 OT Time Calculation (min): 10 min  Charges: OT General Charges $OT Visit: 1 Visit OT Treatments $Therapeutic Activity: 8-22 mins  Jackelyn Poling OTR/L, MS Acute Rehabilitation Department Office# [phone] Pager# [phone]   Marcellina Millin 09/30/2021, 12:14 PM

## 2021-09-30 NOTE — Progress Notes (Signed)
   09/30/21 1000  Mobility  HOB Elevated/Bed Position Self regulated  Activity Ambulated with assistance in hallway  Range of Motion/Exercises Active  Level of Assistance Moderate assist, patient does 50-74% (mod assist to stand; contact guard to walk)  [employer] wheel walker;Wheelchair  Distance Ambulated (ft) 260 ft  Activity Response Tolerated well  Transport method Ambulatory  $Mobility charge 1 Mobility   Pt was agreeable to ambulate. Pt walked w/ RW 22ft. Pt took 3 standing rest breaks & used WC back to the room. Pt requested water during break after sitting in WC;  two sips were taken. Pt left in recliner w/ necessities in reach.   [facility]

## 2021-09-30 NOTE — Progress Notes (Signed)
[name] Michael, [name]  Patient is from: Home  DOA: 08/31/2021 LOS: 73  Chief complaints Chief Complaint  Patient presents with   Chest Pain     Brief Narrative / Interim history: 56 year old F with PMH of stage Ib non-small lung cancer s/p RUL lobectomy in 2020 followed by adjuvant treatment, recent diagnosis of widespread metastatic adenocarcinoma with unclear primary, recent hospitalization from 6/28-7/3 for acute respiratory failure with hypoxia in the setting of acute PE and and cancer related pain returning with cancer related pain that was not controlled with home regimen, and admitted for the same.  Patient was discharged on 2 L by [place] last hospitalization.  Patient underwent US-guided biopsy of LUE soft tissue mass on 7/21.  Pathology showed adenocarcinoma of unclear primary.  MRI brain concerning for brain mets and extracranial soft tissue mets.  [facility] consulted per recommendation by oncology.  CT abdomen and pelvis ordered.  Palliative medicine following for pain management.  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  Pain control on 7/29 but now needs EGD/colonoscopy-- had to be off eliquis so heparin started  09/30/2021: Patient has had EGD and colonoscopy.  Colonoscopy did not reveal any significant findings, however, the preparation was poor.  Upper GI bleed revealed erosive gastritis with bleeding and chronic gastritis.  Patient is currently undergoing radiation.  For radiation today.  Discussed with palliative care team, patient's pain is controlled except particular pain medication needs prior to radiation.  Physical therapy and Occupational Therapy input is appreciated.  Patient's family is asking for hospice input.  Communicated with patient's oncology team, Dr. Earlie Server, and the palliative care team.  Dr. Earlie Server believes that patient is appropriate for  hospice care.  Will pursue disposition once okay with family.  Discussed with patient's family extensively (patient's sister, brother-in-law and another family member).  Patient's sister plans to take patient home.  The family is concerned the patient is a bit sleepy today after radiation therapy (likely secondary to medications used prior to radiation and effect of radiation).  Patient may be stable for discharge in the next 24 hours.  Input from the GI team and the palliative care team is highly appreciated.  Input from the transition of care team is also appreciated.     Subjective: -Informed me that her pain was controlled. -Patient became sleepy after radiation therapy (see above documentation).    Objective: Vitals:   09/29/21 2124 09/30/21 0132 09/30/21 0551 09/30/21 1354  BP: 115/69 106/65 111/66 94/60  Pulse: 90 87 86 90  Resp: 18 18 18 18   Temp: 97.7 F (36.5 C) 98.4 F (36.9 C) 98.5 F (36.9 C)   TempSrc: Oral Oral Oral   SpO2: 98% 98% 98% 100%  Weight:      Height:        Examination:  General: Appearance:     Awake and alert when examined prior to radiation therapy. AAO X 3.     Lungs:      Clear to auscultation.   Heart:    S1S2     Neurologic:   Awake, alert, oriented x 3. [name] apparent focal neurological           defect.            Assessment and plan: Principal Problem:   Chest wall pain Active Problems:   Pulmonary embolism (HCC)   Metastatic adenocarcinoma (HCC)  Intractable pain   Metastases to the liver [facility])   Pain from bone metastases (HCC)   Cancer related pain   Lactic acidosis   Anemia of chronic disease   Bandemia   Sinus tachycardia   Metastasis to brain (HCC)   Pressure injury of skin    Metastatic adenocarcinoma with unclear primary:  -Had stage Ib non-small lung cancer treated with wedge resection with clear margins in [place] in 2020 followed by 3 years of adjuvant therapy. -  Diagnosed with widespread mets in 06/2021.  Had  liver biopsy in [place] in 6/23 that showed adenocarcinoma with immunoreactivity not typical for lung cancer.   -CT angio chest showed large right suprahilar mass extending continuously into mediastinum and measuring about 6.3 cm maximal diameter with mets to brain, liver, tail of pancreas, left anterior fifth ribs and multiple vertebral bodies as well as the left breast tissue.  MRI brain concerning for brain mets -Started radiation to the spine and ribs on 7/17>> -Pathology from left arm tissue biopsy on 7/21 shows adenocarcinoma but unclear primary.  -s/p  EGD/colonoscopy after prep complete-- 8/1 -she states Dr Julien Nordmann is her oncologist  09/30/2021: Oncology team is directing care.  Cancer related pain:  --significant back pain , not able to tolerate XRT well due to pain - palliative care input greatly appreciated, palliative care to continue to adjust analgesics and continue  goals of care discussion  09/30/2021: Pain is controlled.  Palliative care input is highly appreciated.    Left arm swelling: reported on 7/13.  CT left humerus on 7/14 showed 3.7 cm left infraspinatus muscle, 6.2 cm left biceps muscle and other multiple soft tissue masses along the left chest wall and within the left breast soft tissues consistent with metastatic disease  Recent PE/LLE DVT: bilateral PE diagnosed on 08/22/21.  She was discharged on 2 L by [place].  Now on room air. -continue  eliquis  Anemia of chronic disease: Likely due to malignancy.  Physical deconditioning -Therapy recommended home health.   -Hospital bed, RW and 3 and 1 ordered. -plan to go to sister's house  at discharge once pain is better controlled     Pressure Injury 09/19/21 Buttocks Right;Left Stage 2 -  Partial thickness loss of dermis presenting as a shallow open injury with a red, pink wound bed without slough. 2 areas of full thickness wounds, NOT pressure (Active)  09/19/21 1950  Location: Buttocks  Location Orientation: Right;Left   Staging: Stage 2 -  Partial thickness loss of dermis presenting as a shallow open injury with a red, pink wound bed without slough.  Wound Description (Comments): 2 areas of full thickness wounds, NOT pressure  Present on Admission: [name]       DVT prophylaxis:  elquis   apixaban (ELIQUIS) tablet 5 mg  Code Status: Full code Family Communication: daughter at bedside   Needs pain control, continued goals of care discussion  Final disposition: Likely home with HH and DME once pain better controlled, will need outpatient palliative care   Consultants:  Radiation oncology Palliative medicine Interventional radiology GI  Sch Meds:  Scheduled Meds:  acetaminophen  1,000 mg Oral TID   apixaban  5 mg Oral BID   celecoxib  200 mg Oral BID   dexamethasone (DECADRON) injection  8 mg Intravenous Q24H   diazepam  2.5 mg Intravenous UD   famotidine  20 mg Oral QHS   fentaNYL  1 patch Transdermal Q72H   gabapentin  400 mg Oral TID  latanoprost  1 drop Both Eyes QHS   lidocaine  1 patch Transdermal Q24H   metoprolol tartrate  50 mg Oral BID   pantoprazole  40 mg Oral BID   senna  1 tablet Oral QHS   sodium chloride flush  3 mL Intravenous Q12H   traZODone  100 mg Oral QHS   Zinc Oxide   Topical TID   Continuous Infusions:   PRN Meds:.acetaminophen **OR** acetaminophen, HYDROmorphone (DILAUDID) injection, HYDROmorphone, magnesium hydroxide, ondansetron **OR** ondansetron (ZOFRAN) IV, mouth rinse, polyethylene glycol, senna-docusate  Antimicrobials: Anti-infectives (From admission, onward)    None        I have personally reviewed the following labs and images: CBC: Recent Labs  Lab 10/19/2021 0018 09/25/21 0345 09/30/21 0330  WBC 16.2* 17.8* 16.7*  NEUTROABS  --   --  14.8*  HGB 8.8* 9.1* 8.6*  HCT 28.2* 29.0* 26.7*  MCV 76.0* 75.9* 74.8*  PLT 216 192 93*     BMP &GFR Recent Labs  Lab 10/14/2021 0018 09/25/21 0345 09/30/21 0330  NA 135 136 134*  K 4.6 4.5  5.5*  CL 99 99 99  CO2 21* 24 22  GLUCOSE 95 96 106*  BUN 23* 20 36*  CREATININE 0.54 0.54 0.92  CALCIUM 8.8* 9.2 8.6*  MG  --   --  2.6*  PHOS  --   --  3.7     Estimated Creatinine Clearance: 77.1 mL/min (by C-G formula based on SCr of 0.92 mg/dL). Liver & Pancreas: [name] results for input(s): "AST", "ALT", "ALKPHOS", "BILITOT", "PROT", "ALBUMIN" in the last 168 hours.  [name] results for input(s): "LIPASE", "AMYLASE" in the last 168 hours. [name] results for input(s): "AMMONIA" in the last 168 hours. Diabetic: [name] results for input(s): "HGBA1C" in the last 72 hours. [name] results for input(s): "GLUCAP" in the last 168 hours. Cardiac Enzymes: [name] results for input(s): "CKTOTAL", "CKMB", "CKMBINDEX", "TROPONINI" in the last 168 hours.  [name] results for input(s): "PROBNP" in the last 8760 hours. Coagulation Profile: [name] results for input(s): "INR", "PROTIME" in the last 168 hours.  Thyroid Function Tests: [name] results for input(s): "TSH", "T4TOTAL", "FREET4", "T3FREE", "THYROIDAB" in the last 72 hours.  Lipid Profile: [name] results for input(s): "CHOL", "HDL", "LDLCALC", "TRIG", "CHOLHDL", "LDLDIRECT" in the last 72 hours. Anemia Panel: [name] results for input(s): "VITAMINB12", "FOLATE", "FERRITIN", "TIBC", "IRON", "RETICCTPCT" in the last 72 hours. Urine analysis:    Component Value Date/Time   COLORURINE AMBER (A) 09/26/2021 0019   APPEARANCEUR CLOUDY (A) 09/26/2021 0019   LABSPEC 1.021 09/26/2021 0019   PHURINE 5.0 09/26/2021 0019   GLUCOSEU NEGATIVE 09/26/2021 0019   HGBUR SMALL (A) 09/26/2021 0019   BILIRUBINUR NEGATIVE 09/26/2021 0019   KETONESUR 5 (A) 09/26/2021 0019   PROTEINUR 30 (A) 09/26/2021 0019   UROBILINOGEN 1.0 02/14/2010 0600   NITRITE NEGATIVE 09/26/2021 0019   LEUKOCYTESUR MODERATE (A) 09/26/2021 0019   Sepsis Labs: Invalid input(s): "PROCALCITONIN", "LACTICIDVEN"  Microbiology: Recent Results (from the past 240 hour(s))  Urine Culture     Status: Abnormal    Collection Time: 09/26/21  5:14 PM   Specimen: Urine, Clean Catch  Result Value Ref Range Status   Specimen Description   Final    URINE, CLEAN CATCH Performed at [facility], [place] [address], [place], [place] [address]    Special Requests   Final    NONE Performed at [facility], [place] [address], [place], [place] [address]    Culture (A)  Final  80,000 COLONIES/mL DIPHTHEROIDS(CORYNEBACTERIUM SPECIES) Standardized susceptibility testing for this organism is not available. Performed at [facility], [place] [address] [place],  [address]    Report Status 09/27/2021 FINAL  Final    Radiology Studies: [name] results found.   Dana Allan, MD [facility]  If 7PM-7AM, please contact night-coverage [email or web address] 09/30/2021, 6:54 PM

## 2021-10-01 ENCOUNTER — Ambulatory Visit: Admit: 2021-10-01 | Payer: BLUE CROSS/BLUE SHIELD

## 2021-10-01 ENCOUNTER — Encounter (HOSPITAL_COMMUNITY): Payer: Self-pay | Admitting: Internal Medicine

## 2021-10-01 ENCOUNTER — Ambulatory Visit: Payer: BLUE CROSS/BLUE SHIELD

## 2021-10-01 DIAGNOSIS — R0789 Other chest pain: Secondary | ICD-10-CM | POA: Diagnosis not present

## 2021-10-01 LAB — RENAL FUNCTION PANEL
Albumin: 2.5 g/dL — ABNORMAL LOW (ref 3.5–5.0)
Anion gap: 17 — ABNORMAL HIGH (ref 5–15)
BUN: 55 mg/dL — ABNORMAL HIGH (ref 6–20)
CO2: 21 mmol/L — ABNORMAL LOW (ref 22–32)
Calcium: 8.9 mg/dL (ref 8.9–10.3)
Chloride: 97 mmol/L — ABNORMAL LOW (ref 98–111)
Creatinine, Ser: 1.25 mg/dL — ABNORMAL HIGH (ref 0.44–1.00)
GFR, Estimated: 50 mL/min — ABNORMAL LOW (ref >60)
Glucose, Bld: 104 mg/dL — ABNORMAL HIGH (ref 70–99)
Phosphorus: 3.8 mg/dL (ref 2.5–4.6)
Potassium: 6.1 mmol/L — ABNORMAL HIGH (ref 3.5–5.1)
Sodium: 135 mmol/L (ref 135–145)

## 2021-10-01 MED ORDER — ACETAMINOPHEN 650 MG RE SUPP: 650.0000 mg | Freq: Four times a day (QID) | RECTAL | Status: DC | PRN

## 2021-10-01 MED ORDER — HALOPERIDOL LACTATE 5 MG/ML IJ SOLN
0.5000 mg | INTRAMUSCULAR | Status: DC | PRN
  Administered 2021-10-02: 0.5 mg via INTRAVENOUS
  Filled 2021-10-01: qty 1

## 2021-10-01 MED ORDER — ONDANSETRON HCL 4 MG/2ML IJ SOLN
4.0000 mg | Freq: Four times a day (QID) | INTRAMUSCULAR | Status: DC | PRN
  Administered 2021-10-01: 4 mg via INTRAVENOUS

## 2021-10-01 MED ORDER — GLYCOPYRROLATE 1 MG PO TABS
1.0000 mg | ORAL_TABLET | ORAL | Status: DC | PRN
  Filled 2021-10-01: qty 1

## 2021-10-01 MED ORDER — HALOPERIDOL LACTATE 2 MG/ML PO CONC
0.5000 mg | ORAL | Status: DC | PRN
  Filled 2021-10-01: qty 5

## 2021-10-01 MED ORDER — SODIUM CHLORIDE 0.9 % IV SOLN: INTRAVENOUS | Status: DC

## 2021-10-01 MED ORDER — GLYCOPYRROLATE 0.2 MG/ML IJ SOLN: 0.2000 mg | INTRAMUSCULAR | Status: DC | PRN

## 2021-10-01 MED ORDER — POLYVINYL ALCOHOL 1.4 % OP SOLN
1.0000 [drp] | Freq: Four times a day (QID) | OPHTHALMIC | Status: DC | PRN
  Filled 2021-10-01: qty 15

## 2021-10-01 MED ORDER — SODIUM ZIRCONIUM CYCLOSILICATE 10 G PO PACK
10.0000 g | PACK | Freq: Every day | ORAL | Status: DC
  Administered 2021-10-01: 10 g via ORAL
  Filled 2021-10-01: qty 1

## 2021-10-01 MED ORDER — BIOTENE DRY MOUTH MT LIQD: 15.0000 mL | OROMUCOSAL | Status: DC | PRN

## 2021-10-01 MED ORDER — HALOPERIDOL 0.5 MG PO TABS
0.5000 mg | ORAL_TABLET | ORAL | Status: DC | PRN
  Filled 2021-10-01: qty 1

## 2021-10-01 MED ORDER — SODIUM BICARBONATE 8.4 % IV SOLN
25.0000 meq | Freq: Once | INTRAVENOUS | Status: AC
  Administered 2021-10-01: 25 meq via INTRAVENOUS
  Filled 2021-10-01: qty 50

## 2021-10-01 MED ORDER — ACETAMINOPHEN 325 MG PO TABS: 650.0000 mg | ORAL_TABLET | Freq: Four times a day (QID) | ORAL | Status: DC | PRN

## 2021-10-01 MED ORDER — ONDANSETRON 4 MG PO TBDP: 4.0000 mg | ORAL_TABLET | Freq: Four times a day (QID) | ORAL | Status: DC | PRN

## 2021-10-01 NOTE — Progress Notes (Signed)
Family in room, patient condition continues to deteriorate, family requested that patient be kept comfortable and be made a DNR, MD informed of patient and family wishes

## 2021-10-01 NOTE — Progress Notes (Addendum)
PT Cancellation Note  Patient Details Name: [name] MRN: [id number] DOB: 1965/11/02   Cancelled Treatment:    Reason Eval/Treat Not Completed:  Attempted PT tx session-pt family declined at this time-nursing in room to help with bathing. will check back as schedule allows.    [facility]  Office: [phone] Pager: [phone]

## 2021-10-01 NOTE — Evaluation (Signed)
Physical Therapy Re-Evaluation Patient Details Name: [name] MRN: [id number] DOB: Apr 10, 1965 Today's Date: 10/01/2021  History of Present Illness  56 year old F with PMH of stage Ib non-small lung cancer s/p RUL lobectomy in 2020 followed by adjuvant treatment, recent diagnosis of widespread metastatic adenocarcinoma with unclear primary, recent hospitalization from 6/28-7/3 for acute respiratory failure with hypoxia in the setting of acute PE and and cancer related pain returning with cancer related pain that was not controlled with home regimen. New onset of R wrist drop-splint ordered. Pt was discharged from acute PT caseload on 09/12/21. PT reordered on 10/01/21.   Clinical Impression  On re-eval, pt required Mod A to stand and Min A (with wheelchair follow) to ambulate on today. She only tolerated ambulating ~40 feet x 2 with a RW. Pt had LOB x 1 with near fall on today. She reported feeling weak. She denied dyspnea and dizziness. Family was present during session. They acknowledge that pt has become weaker over last couple of weeks. Discussed d/c plan with family-plan is for pt to return home in care of family. Pt's sister is mostly concerned about the patient's pain control and continued medical plan/course. Based on pt presentation on today, will plan to continue to follow patient throughout this hospital stay. PT recommendation is for HHPT f/u at this time.      Recommendations for follow up therapy are one component of a multi-disciplinary discharge planning process, led by the attending physician.  Recommendations may be updated based on patient status, additional functional criteria and insurance authorization.  Follow Up Recommendations Home health PT      Assistance Recommended at Discharge Frequent or constant Supervision/Assistance  Patient can return home with the following  Assist for transportation;Assistance with cooking/housework;Help with stairs or ramp for entrance;A lot  of help with walking and/or transfers;A lot of help with bathing/dressing/bathroom    Equipment Recommendations Wheelchair (measurements PT)  Recommendations for Other Services  OT consult    Functional Status Assessment Patient has had a recent decline in their functional status and demonstrates the ability to make significant improvements in function in a reasonable and predictable amount of time.     Precautions / Restrictions Precautions Precautions: Fall Precaution Comments: on O2 PRN? Restrictions Weight Bearing Restrictions: No      Mobility  Bed Mobility               General bed mobility comments: up in recliner (has been sleeping in recliner)    Transfers Overall transfer level: Needs assistance Equipment used: Rolling walker (2 wheels) Transfers: Sit to/from Stand Sit to Stand: Mod assist           General transfer comment: Mod A to power up from recliner x 1, from WC x 1. Cues for safety, technique. Increased time and effort for pt.    Ambulation/Gait Ambulation/Gait assistance: Min assist, +2 safety/equipment Gait Distance (Feet): 40 Feet (x2) Assistive device: Rolling walker (2 wheels) Gait Pattern/deviations: Step-through pattern, Decreased stride length       General Gait Details: Unsteady at times. Fatigued easily on today with near fall when LEs buckled after ~40 feet. Assisted pt into wheelchair and allowed pt to rest before walking back to the room. O2 100% on RA, HR 112 bpm. Pt reported feelin weak this session. Pt denied dizziness and dyspnea.  Stairs            Wheelchair Mobility    Modified Rankin (Stroke Patients Only)  Balance Overall balance assessment: Needs assistance         Standing balance support: Bilateral upper extremity supported, Reliant on assistive device for balance, During functional activity Standing balance-Leahy Scale: Poor                               Pertinent Vitals/Pain  Pain Assessment Pain Assessment: Faces Faces Pain Scale: Hurts even more Pain Location: B shoulders and back; buttocks Pain Descriptors / Indicators: Discomfort, Sore, Grimacing Pain Intervention(s): Limited activity within patient's tolerance, Monitored during session, Repositioned    Home Living Family/patient expects to be discharged to:: Private residence Living Arrangements: Other relatives (sister) Available Help at Discharge: Family;Available 24 hours/day Type of Home: House Home Access: Stairs to enter Entrance Stairs-Rails: Psychiatric nurse of Steps: 8   Home Layout: One level Home Equipment: Rollator (4 wheels);Shower seat;BSC/3in1      Prior Function Prior Level of Function : Needs assist             Mobility Comments: sister assists witrh gait and rollator, patient requesting RW ADLs Comments: sister and daughter will assist at home per patient report     Hand Dominance   Dominant Hand: Right    Extremity/Trunk Assessment   Upper Extremity Assessment Upper Extremity Assessment: Defer to OT evaluation RUE Deficits / Details: has splint in room RUE Coordination: decreased fine motor;decreased gross motor LUE Coordination: decreased fine motor;decreased gross motor    Lower Extremity Assessment Lower Extremity Assessment: Generalized weakness    Cervical / Trunk Assessment Cervical / Trunk Assessment: Normal  Communication   Communication: No difficulties  Cognition Arousal/Alertness: Awake/alert Behavior During Therapy: WFL for tasks assessed/performed Overall Cognitive Status: Within Functional Limits for tasks assessed                                          General Comments      Exercises     Assessment/Plan    PT Assessment Patient needs continued PT services  PT Problem List Decreased strength;Decreased mobility;Decreased activity tolerance;Cardiopulmonary status limiting activity;Decreased knowledge  of use of DME;Decreased knowledge of precautions       PT Treatment Interventions DME instruction;Therapeutic activities;Gait training;Therapeutic exercise;Patient/family education;Functional mobility training    PT Goals (Current goals can be found in the Care Plan section)  Acute Rehab PT Goals Patient Stated Goal: less pain. family wants pain controlled and for pt to be back home PT Goal Formulation: With patient/family Time For Goal Achievement: 10/15/21 Potential to Achieve Goals: Fair    Frequency Min 3X/week     Co-evaluation               AM-PAC PT "6 Clicks" Mobility  Outcome Measure Help needed turning from your back to your side while in a flat bed without using bedrails?: A Lot Help needed moving from lying on your back to sitting on the side of a flat bed without using bedrails?: A Lot Help needed moving to and from a bed to a chair (including a wheelchair)?: A Lot Help needed standing up from a chair using your arms (e.g., wheelchair or bedside chair)?: A Lot Help needed to walk in hospital room?: A Lot Help needed climbing 3-5 steps with a railing? : Total 6 Click Score: 11    End of Session Equipment Utilized During Treatment: Gait  belt Activity Tolerance: Patient limited by fatigue Patient left: in chair;with call bell/phone within reach;with family/visitor present   PT Visit Diagnosis: Difficulty in walking, not elsewhere classified (R26.2);Muscle weakness (generalized) (M62.81) Pain - part of body: Shoulder (buttocks)    Time: 7209-1980 PT Time Calculation (min) (ACUTE ONLY): 49 min   Charges:   PT Evaluation $PT Re-evaluation: 1 Re-eval PT Treatments $Gait Training: 23-37 mins          Doreatha Massed, PT Acute Rehabilitation  Office: [phone] Pager: [phone]

## 2021-10-01 NOTE — Progress Notes (Signed)
Daily Progress Note   Patient Name: [name]       Date: 10/01/2021 DOB: [birth year]  Age: 56 y.o. MRN#: [id number] Attending Physician: Bonnell Public, MD Primary Care Physician: Pcp, No Admit Date: 09/23/2021  Reason for Consultation/Follow-up: Pain control  Subjective: Patient is sitting up in chair, family present at bedside.  Patient appears weak and deconditioned, appears with ongoing decline in functional status.  CODE STATUS and goals of care discussions undertaken with family at bedside including her sister.  See below.    Length of Stay: 27  Current Medications: Scheduled Meds:   acetaminophen  1,000 mg Oral TID   apixaban  5 mg Oral BID   celecoxib  200 mg Oral BID   dexamethasone (DECADRON) injection  8 mg Intravenous Q24H   diazepam  2.5 mg Intravenous UD   famotidine  20 mg Oral QHS   fentaNYL  1 patch Transdermal Q72H   gabapentin  400 mg Oral TID   latanoprost  1 drop Both Eyes QHS   lidocaine  1 patch Transdermal Q24H   metoprolol tartrate  50 mg Oral BID   pantoprazole  40 mg Oral BID   senna  1 tablet Oral QHS   sodium chloride flush  3 mL Intravenous Q12H   sodium zirconium cyclosilicate  10 g Oral Daily   traZODone  100 mg Oral QHS   Zinc Oxide   Topical TID    Continuous Infusions:  sodium chloride      PRN Meds: acetaminophen **OR** acetaminophen, HYDROmorphone (DILAUDID) injection, HYDROmorphone, magnesium hydroxide, ondansetron **OR** ondansetron (ZOFRAN) IV, mouth rinse, polyethylene glycol, senna-docusate  Physical Exam         Awakens but appears with generalized weakness not as  alert Resting in chair No distress Regular work of breathing S 1 S 2 Abdomen not distended  Vital Signs: BP (!) 88/59 (BP Location: Left Arm)   Pulse  97   Temp 98 F (36.7 C) (Oral)   Resp 16   Ht 5\' 9"  (1.753 m)   Wt 83.9 kg   SpO2 99%   BMI 27.31 kg/m  SpO2: SpO2: 99 % O2 Device: O2 Device: Room Air O2 Flow Rate: O2 Flow Rate (L/min): 2 L/min  Intake/output summary:  Intake/Output Summary (Last 24 hours) at 10/01/2021 1443 Last data  filed at 10/01/2021 0600 Gross per 24 hour  Intake 210 ml  Output 0 ml  Net 210 ml    LBM: Last BM Date : 09/26/21 Baseline Weight: Weight: 83.9 kg Most recent weight: Weight: 83.9 kg       Palliative Assessment/Data:      Patient Active Problem List   Diagnosis Date Noted   Pressure injury of skin 09/20/2021   Metastasis to brain ([place]) 09/18/2021   Sinus tachycardia 09/12/2021   Anemia of chronic disease 09/11/2021   Bandemia 09/11/2021   Cancer related pain 09/10/2021   Lactic acidosis 09/10/2021   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[place]) 09/06/2021   Pain from bone metastases ([place]) 09/06/2021   Chest wall pain 09/04/2021   Metastatic adenocarcinoma ([place]) 09/04/2021   Intractable pain 09/04/2021   Pulmonary embolism ([place]) 08/22/2021   Acute pulmonary embolism, unspecified pulmonary embolism type, unspecified whether acute cor pulmonale present ([place]) 08/22/2021   Primary cancer of right upper lobe of lung ([place]) 07/08/2018   Urinary incontinence 04/05/2011    Palliative Care Assessment & Plan   Patient Profile:    Assessment:  56 year old F with PMH of 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  Patient was discharged on 2 L by [place] last hospitalization.    Radiation oncology consulted, and patient started radiation treatment on 7/17.  Pain control remains barriers to discharge.  Oncology and palliative medicine consulted.    Patient  underwent US-guided biopsy of LUE soft tissue mass on 7/21.  Pathology pending.  Palliative medicine following for pain management  Recommendations/Plan: Medication history noted. Goals of care discussions undertaken with the patient's sister and other family members were present at the bedside.  We reviewed about the patient's current condition and her serious illness.  Discussed frankly but compassionately about my concerns that the patient continues to have ongoing decline in functional status.  CODE STATUS discussions undertaken.  Fully discussed about differences between full code versus DNR/DNI.  Discussed about recommendations for establishing DNR/DNI.  Patient's sister wishes to discuss with the patient's 2 daughters and request for continuation of full code/full scope care for now.  She understands the serious nature of the patient's illness.  Offered active listening and supportive presence, palliative team to continue to follow.    Goals of Care and Additional Recommendations: Limitations on Scope of Treatment: Full Scope Treatment  Code Status:    Code Status Orders  (From admission, onward)           Start     Ordered   09/04/21 0035  Full code  Continuous        09/04/21 0037           Code Status History     Date Active Date Inactive Code Status Order ID Comments User Context   08/22/2021 0743 08/27/2021 0129 Full Code [id number]  [name], MD Inpatient   08/22/2021 0309 08/22/2021 0743 Full Code [id number]  Howerter, Ethelda Chick, DO ED       Prognosis:  Unable to determine  Discharge Planning: To Be Determined  Care plan was discussed with  patient's sister.     Thank you for allowing the Palliative Medicine Team to assist in the care of this patient.  MOD MDM.  Greater than 50%  of this time was spent counseling and coordinating care related to the above assessment and plan.  Loistine Chance, MD  Please contact Palliative Medicine Team phone at  [phone] for questions and concerns.

## 2021-10-01 NOTE — Progress Notes (Signed)
PROGRESS NOTE  [name] RWE:[id number] DOB: 1965/08/19   PCP: Pcp, No  Patient is from: Home  DOA: 09/16/2021 LOS: 82  Chief complaints Chief Complaint  Patient presents with   Chest Pain     Brief Narrative / Interim history: 56 year old F with PMH of stage Ib non-small lung cancer s/p RUL lobectomy in 2020 followed by adjuvant treatment, recent diagnosis of widespread metastatic adenocarcinoma with unclear primary, recent hospitalization from 6/28-7/3 for acute respiratory failure with hypoxia in the setting of acute PE and and cancer related pain returning with cancer related pain that was not controlled with home regimen, and admitted for the same.  Patient was discharged on 2 L by [place] last hospitalization.  Patient underwent US-guided biopsy of LUE soft tissue mass on 7/21.  Pathology showed adenocarcinoma of unclear primary.  MRI brain concerning for brain mets and extracranial soft tissue mets.  [facility] consulted per recommendation by oncology.  CT abdomen and pelvis ordered.  Palliative medicine following for pain management.  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  Pain control on 7/29 but now needs EGD/colonoscopy-- had to be off eliquis so heparin started  09/30/2021: Patient has had EGD and colonoscopy.  Colonoscopy did not reveal any significant findings, however, the preparation was poor.  Upper GI bleed revealed erosive gastritis with bleeding and chronic gastritis.  Patient is currently undergoing radiation.  For radiation today.  Discussed with palliative care team, patient's pain is controlled except particular pain medication needs prior to radiation.  Physical therapy and Occupational Therapy input is appreciated.  Patient's family is asking for hospice input.  Communicated with patient's oncology team, Dr. Earlie Server, and the palliative care team.  Dr. Earlie Server believes that patient is appropriate for  hospice care.  Will pursue disposition once okay with family.  Discussed with patient's family extensively (patient's sister, brother-in-law and another family member).  Patient's sister plans to take patient home.  The family is concerned the patient is a bit sleepy today after radiation therapy (likely secondary to medications used prior to radiation and effect of radiation).  Patient may be stable for discharge in the next 24 hours.  Input from the GI team and the palliative care team is highly appreciated.  Input from the transition of care team is also appreciated.  10/01/2021: Patient seen alongside 2 sisters, brother-in-law and family member.  Patient decided not to pursue radiation today.  Palliative care input is appreciated.  The decision has been made to pursue comfort directed care.  Hospice team has been consulted.  Subjective: -No new complaints.  Objective: Vitals:   09/30/21 0551 09/30/21 1354 09/30/21 2124 10/01/21 0614  BP: 111/66 94/60 102/74 101/63  Pulse: 86 90 95 91  Resp: 18 18 12 16   Temp: 98.5 F (36.9 C)  97.9 F (36.6 C) 97.6 F (36.4 C)  TempSrc: Oral  Oral Oral  SpO2: 98% 100% 97% 99%  Weight:      Height:        Examination:  General: Appearance:     Awake and alert when examined prior to radiation therapy. AAO X 3.     Lungs:      Clear to auscultation.   Heart:    S1S2     Neurologic:   Awake, alert, oriented x 3. No apparent focal neurological           defect.  Assessment and plan: Principal Problem:   Chest wall pain Active Problems:   Pulmonary embolism (HCC)   Metastatic adenocarcinoma (HCC)   Intractable pain   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HCC)   Pain from bone metastases (HCC)   Cancer related pain   Lactic acidosis   Anemia of chronic disease   Bandemia   Sinus tachycardia   Metastasis to brain (HCC)   Pressure injury of skin    Metastatic adenocarcinoma with unclear primary:  -Had stage Ib non-small lung cancer  treated with wedge resection with clear margins in [place] in 2020 followed by 3 years of adjuvant therapy. -  Diagnosed with widespread mets in 06/2021.  Had liver biopsy in [place] in 6/23 that showed adenocarcinoma with immunoreactivity not typical for lung cancer.   -CT angio chest showed large right suprahilar mass extending continuously into mediastinum and measuring about 6.3 cm maximal diameter with mets to brain, liver, tail of pancreas, left anterior fifth ribs and multiple vertebral bodies as well as the left breast tissue.  MRI brain concerning for brain mets -Started radiation to the spine and ribs on 7/17>> -Pathology from left arm tissue biopsy on 7/21 shows adenocarcinoma but unclear primary.  -s/p  EGD/colonoscopy after prep complete-- 8/1 -she states Dr Julien Nordmann is her oncologist  09/30/2021: Oncology team is directing care. 10/01/2021: For comfort directed care.  Cancer related pain:  --significant back pain , not able to tolerate XRT well due to pain - palliative care input greatly appreciated, palliative care to continue to adjust analgesics and continue  goals of care discussion  10/01/2021: Pain is controlled.  Palliative care input is highly appreciated.    Left arm swelling: reported on 7/13.  CT left humerus on 7/14 showed 3.7 cm left infraspinatus muscle, 6.2 cm left biceps muscle and other multiple soft tissue masses along the left chest wall and within the left breast soft tissues consistent with metastatic disease  Recent PE/LLE DVT: bilateral PE diagnosed on 08/22/21.  She was discharged on 2 L by [facility].  Now on room air. -continue  eliquis  Anemia of chronic disease: Likely due to malignancy.  Physical deconditioning -Comfort directed care.         Pressure Injury 09/19/21 Buttocks Right;Left Stage 2 -  Partial thickness loss of dermis presenting as a shallow open injury with a red, pink wound bed without slough. 2 areas of full thickness wounds, NOT pressure (Active)   09/19/21 1950  Location: Buttocks  Location Orientation: Right;Left  Staging: Stage 2 -  Partial thickness loss of dermis presenting as a shallow open injury with a red, pink wound bed without slough.  Wound Description (Comments): 2 areas of full thickness wounds, NOT pressure  Present on Admission: No       DVT prophylaxis:  elquis   apixaban (ELIQUIS) tablet 5 mg  Code Status: Full code Family Communication: Family members.  For comfort directed care.     Consultants:  Radiation oncology Palliative medicine Interventional radiology GI  Sch Meds:  Scheduled Meds:  acetaminophen  1,000 mg Oral TID   apixaban  5 mg Oral BID   celecoxib  200 mg Oral BID   dexamethasone (DECADRON) injection  8 mg Intravenous Q24H   diazepam  2.5 mg Intravenous UD   famotidine  20 mg Oral QHS   fentaNYL  1 patch Transdermal Q72H   gabapentin  400 mg Oral TID   latanoprost  1 drop Both Eyes QHS   lidocaine  1  patch Transdermal Q24H   metoprolol tartrate  50 mg Oral BID   pantoprazole  40 mg Oral BID   senna  1 tablet Oral QHS   sodium bicarbonate  25 mEq Intravenous Once   sodium chloride flush  3 mL Intravenous Q12H   sodium zirconium cyclosilicate  10 g Oral Daily   traZODone  100 mg Oral QHS   Zinc Oxide   Topical TID   Continuous Infusions:  sodium chloride      PRN Meds:.acetaminophen **OR** acetaminophen, HYDROmorphone (DILAUDID) injection, HYDROmorphone, magnesium hydroxide, ondansetron **OR** ondansetron (ZOFRAN) IV, mouth rinse, polyethylene glycol, senna-docusate  Antimicrobials: Anti-infectives (From admission, onward)    None        I have personally reviewed the following labs and images: CBC: Recent Labs  Lab 09/25/21 0345 09/30/21 0330  WBC 17.8* 16.7*  NEUTROABS  --  14.8*  HGB 9.1* 8.6*  HCT 29.0* 26.7*  MCV 75.9* 74.8*  PLT 192 93*     BMP &GFR Recent Labs  Lab 09/25/21 0345 09/30/21 0330 10/01/21 0545  NA 136 134* 135  K 4.5 5.5* 6.1*   CL 99 99 97*  CO2 24 22 21*  GLUCOSE 96 106* 104*  BUN 20 36* 55*  CREATININE 0.54 0.92 1.25*  CALCIUM 9.2 8.6* 8.9  MG  --  2.6*  --   PHOS  --  3.7 3.8     Estimated Creatinine Clearance: 56.8 mL/min (A) (by C-G formula based on SCr of 1.25 mg/dL (H)). Liver & Pancreas: Recent Labs  Lab 10/01/21 0545  ALBUMIN 2.5*    No results for input(s): "LIPASE", "AMYLASE" in the last 168 hours. No results for input(s): "AMMONIA" in the last 168 hours. Diabetic: No results for input(s): "HGBA1C" in the last 72 hours. No results for input(s): "GLUCAP" in the last 168 hours. Cardiac Enzymes: No results for input(s): "CKTOTAL", "CKMB", "CKMBINDEX", "TROPONINI" in the last 168 hours.  No results for input(s): "PROBNP" in the last 8760 hours. Coagulation Profile: No results for input(s): "INR", "PROTIME" in the last 168 hours.  Thyroid Function Tests: No results for input(s): "TSH", "T4TOTAL", "FREET4", "T3FREE", "THYROIDAB" in the last 72 hours.  Lipid Profile: No results for input(s): "CHOL", "HDL", "LDLCALC", "TRIG", "CHOLHDL", "LDLDIRECT" in the last 72 hours. Anemia Panel: No results for input(s): "VITAMINB12", "FOLATE", "FERRITIN", "TIBC", "IRON", "RETICCTPCT" in the last 72 hours. Urine analysis:    Component Value Date/Time   COLORURINE AMBER (A) 09/26/2021 0019   APPEARANCEUR CLOUDY (A) 09/26/2021 0019   LABSPEC 1.021 09/26/2021 0019   PHURINE 5.0 09/26/2021 0019   GLUCOSEU NEGATIVE 09/26/2021 0019   HGBUR SMALL (A) 09/26/2021 0019   BILIRUBINUR NEGATIVE 09/26/2021 0019   KETONESUR 5 (A) 09/26/2021 0019   PROTEINUR 30 (A) 09/26/2021 0019   UROBILINOGEN 1.0 02/14/2010 0600   NITRITE NEGATIVE 09/26/2021 0019   LEUKOCYTESUR MODERATE (A) 09/26/2021 0019   Sepsis Labs: Invalid input(s): "PROCALCITONIN", "LACTICIDVEN"  Microbiology: Recent Results (from the past 240 hour(s))  Urine Culture     Status: Abnormal   Collection Time: 09/26/21  5:14 PM   Specimen: Urine,  Clean Catch  Result Value Ref Range Status   Specimen Description   Final    URINE, CLEAN CATCH Performed at [facility], [place] [address], [place], [place] [address]    Special Requests   Final    NONE Performed at [facility], [place] [address] [place], [place] [address]    Culture (A)  Final  80,000 COLONIES/mL DIPHTHEROIDS(CORYNEBACTERIUM SPECIES) Standardized susceptibility testing for this organism is not available. Performed at [facility], [place] [address], [place], [place] [address]    Report Status 09/27/2021 FINAL  Final    Radiology Studies: No results found.   Dana Allan, MD [facility]  If 7PM-7AM, please contact night-coverage [email or web address] 10/01/2021, 2:05 PM

## 2021-10-01 NOTE — Progress Notes (Signed)
Palliative Care  Progress Note  [name] is resting comfortably, still a bit sleepy after her radiation tx this afternoon. She is requiring administration of IV valium prior to tx and IV pain medication in order to lie on the table. We have been working towards discharging her home and are waiting on her gene testing to result to see if she has a treatable marker per Dr. Earlie Server. If there is no target therapy available then the recommendation will be for hospice care. I have had conversations with Hellen about her prognosis and what if scenarios- she wanted to discuss her options with her family before making any decisions about her code status or ACP. Her pain is under much better control.-she has only required one extra dose of prn oral 8mg  hydromorphone in the last 24 hours.which is a significant reduction.  Recommendations:  Continue radiation for pain control and maintain current TD and oral regimen. Continue steroids and NSAID at current dose. Ongoing discussions FM:MCRF after hospitalizations-she would be hospice appropriate if no systemic treatment option.   Lane Hacker, DO Palliative Medicine  Time:35 min

## 2021-10-02 ENCOUNTER — Ambulatory Visit: Payer: BLUE CROSS/BLUE SHIELD

## 2021-10-02 ENCOUNTER — Encounter (HOSPITAL_COMMUNITY): Payer: Self-pay | Admitting: Internal Medicine

## 2021-10-02 DIAGNOSIS — I2699 Other pulmonary embolism without acute cor pulmonale: Secondary | ICD-10-CM | POA: Diagnosis not present

## 2021-10-02 DIAGNOSIS — R0789 Other chest pain: Secondary | ICD-10-CM | POA: Diagnosis not present

## 2021-10-02 DIAGNOSIS — C7931 Secondary malignant neoplasm of brain: Secondary | ICD-10-CM

## 2021-10-02 DIAGNOSIS — C799 Secondary malignant neoplasm of unspecified site: Secondary | ICD-10-CM | POA: Diagnosis not present

## 2021-10-02 DIAGNOSIS — C787 Secondary malignant neoplasm of liver and intrahepatic bile duct: Secondary | ICD-10-CM | POA: Diagnosis not present

## 2021-10-02 MED ORDER — LORAZEPAM 2 MG/ML IJ SOLN
0.5000 mg | INTRAMUSCULAR | Status: DC | PRN
Start: 2021-10-02 — End: 2021-10-05

## 2021-10-02 MED ORDER — HYDROMORPHONE HCL 1 MG/ML IJ SOLN: 1.0000 mg | INTRAMUSCULAR | Status: DC | PRN

## 2021-10-02 MED ORDER — HYDROMORPHONE HCL 1 MG/ML IJ SOLN
1.0000 mg | INTRAMUSCULAR | Status: DC | PRN
  Administered 2021-10-02: 1 mg via INTRAVENOUS
  Filled 2021-10-02: qty 1

## 2021-10-02 MED ORDER — SODIUM CHLORIDE 0.9 % IV SOLN
0.5000 mg/h | INTRAVENOUS | Status: DC
  Administered 2021-10-02: 0.5 mg/h via INTRAVENOUS
  Administered 2021-10-03: 1.75 mg/h via INTRAVENOUS
  Administered 2021-10-04: 2 mg/h via INTRAVENOUS
  Administered 2021-10-04: 1.75 mg/h via INTRAVENOUS
  Administered 2021-10-05: 2 mg/h via INTRAVENOUS
  Filled 2021-10-02 (×6): qty 2.5

## 2021-10-02 NOTE — Progress Notes (Signed)
Manufacturing [employer] [facility]) Hospital Liaison Note  Patient does not appear to be appropriate for transfer to [facility] at this time per inpatient medical team.   [facility] remains available for assist if needed. Please call with any questions or concerns. Thank you.   Buck Mam [facility] Liaison [phone]

## 2021-10-02 NOTE — Progress Notes (Signed)
Patient sitting up in chair, family at bedside, requesting something to "make it stop, I can not take this anymore, Im ready to go"  Family not supportive of patients wishes for pain medication.  Patient requested pain medication and Dilaudid administered as ordered, patient states that this is not helping her pain at all.  Page sent to [place] and Palliative medicine to advise.  This RN tried to talk patient into getting into bed, in hopes that we could make her more comfortable,patient continues to refuse, will await input from [place] regarding pain regimen

## 2021-10-02 NOTE — Progress Notes (Signed)
PT Cancellation Note  Patient Details Name: [name] MRN: [id number] DOB: [birth year]   Cancelled Treatment:    Reason Eval/Treat Not Completed:  Will sign off. Pt is now comfort care.    [facility]  Office: [phone] Pager: [phone]

## 2021-10-02 NOTE — Progress Notes (Signed)
OT Cancellation Note  Patient Details Name: AIDALY CORDNER MRN: [id number] DOB: 03/02/1965   Cancelled Treatment:    Reason Eval/Treat Not Completed: Other (comment) Patient was transitioned to DNR and comfort care on 8/8. OT signing off at this time Jackelyn Poling OTR/L, [facility] Office# [phone] Pager# [phone]  10/02/2021, 8:11 AM

## 2021-10-02 NOTE — Progress Notes (Signed)
Chaplain checked in on [name] and her family to see how she could offer support.  Chaplain let family know that Chaplains are on campus if needed.  Chaplain was able to offer some suggestions in getting [name] comfortable.  Chaplain mainly offered presence during this time and establishing relationship.  Daughter during this time needed to step out and emotionally release.  Chaplain followed daughter and provided space for her to share her grief.  Daughter is in a place of recognizing that her mother is dying and she is trying to prepare herself as much as possible.  [name] has been able to share with her family the desire to rest and not suffer any longer.  Daughter engaged in [place], talking about her mother and who her mother has been to her and so many others.  Chaplain affirmed for her to uplift those thoughts to get her through this difficult time.  She has felt that it will be impossible to continue on but Chaplain worked to offer hope.    Chaplain was able to bring some coloring supplies and activities to [name]'s grandson in the room.  Chaplain offered reflective listening, a compassionate presence, and a willingness to be there if needed.    10/02/21 1100  Clinical Encounter Type  Visited With Patient and family together  Visit Type Follow-up;Spiritual support  Referral From Palliative care team  Consult/Referral To Chaplain  Stress Factors  Family Stress Factors Major life changes;Health changes

## 2021-10-02 NOTE — Progress Notes (Signed)
Palliative Medicine Progress Note   Patient Name: [name]       Date: 10/02/2021 DOB: 12-11-1965  Age: 57 y.o. MRN#: [id number] Attending Physician: Bonnell Public, MD Primary Care Physician: Pcp, No Admit Date: 09/20/2021  Reason for Consultation/Follow-up: {Reason for Consult:23484}  HPI/Patient Profile: 56 year old F with PMH of 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  Patient was discharged on 2 L by [place] last hospitalization.    Radiation oncology consulted, and patient started radiation treatment on 7/17.  Pain control remains barriers to discharge.  Oncology and palliative medicine consulted.    Patient underwent US-guided biopsy of LUE soft tissue mass on 7/21.  Pathology pending. Palliative medicine following for pain management   Subjective: ***  Objective:  Physical Exam          Vital Signs: BP 108/63 (BP Location: Left Arm)   Pulse (!) 124   Temp 98.7 F (37.1 C) (Oral)   Resp 17   Ht 5\' 9"  (1.753 m)   Wt 83.9 kg   SpO2 98%   BMI 27.31 kg/m  SpO2: SpO2: 98 % O2 Device: O2 Device: Room Air O2 Flow Rate: O2 Flow Rate (L/min): 2 L/min  Intake/output summary:  Intake/Output Summary (Last 24 hours) at 10/02/2021 1515 Last data filed at 10/02/2021 1400 Gross per 24 hour  Intake 1187.32 ml  Output 200 ml  Net 987.32 ml    LBM: Last BM Date : 09/26/21     Palliative Assessment/Data: ***     Palliative Medicine Assessment & Plan   Assessment: Principal Problem:   Chest wall pain Active Problems:    Pulmonary embolism (HCC)   Metastatic adenocarcinoma (HCC)   Intractable pain   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HCC)   Pain from bone metastases (HCC)   Cancer related pain   Lactic acidosis   Anemia of chronic disease   Bandemia   Sinus tachycardia   Metastasis to brain (HCC)   Pressure injury of skin    Recommendations/Plan: Continue comfort measures DNR/DNI as previously documented Increased frequency of dilaudid to 1 mg IV every 3 hours  as needed for pain  Goals of Care and Additional Recommendations: Limitations on Scope of Treatment: {Recommended Scope and Preferences:21019}  Code Status:   Prognosis:  {Palliative Care Prognosis:23504}  Discharge Planning: {Palliative dispostion:23505}  Care plan was discussed with ***  Thank you for allowing the Palliative Medicine Team to assist in the care of this patient.   ***   Lavena Bullion, NP   Please contact Palliative Medicine Team phone at [phone] for questions and concerns.  For individual providers, please see AMION.

## 2021-10-02 NOTE — Progress Notes (Signed)
PROGRESS NOTE  [name] PJA:[id number] DOB: [birth year]   PCP: Pcp, No  Patient is from: Home  DOA: 09/02/2021 LOS: 38  Chief complaints Chief Complaint  Patient presents with   Chest Pain     Brief Narrative / Interim history: 57 year old F with PMH of stage Ib non-small lung cancer s/p RUL lobectomy in 2020 followed by adjuvant treatment, recent diagnosis of widespread metastatic adenocarcinoma with unclear primary, recent hospitalization from 6/28-7/3 for acute respiratory failure with hypoxia in the setting of acute PE and and cancer related pain returning with cancer related pain that was not controlled with home regimen, and admitted for the same.  Patient was discharged on 2 L by [place] last hospitalization.  Patient underwent US-guided biopsy of LUE soft tissue mass on 7/21.  Pathology showed adenocarcinoma of unclear primary.  MRI brain concerning for brain mets and extracranial soft tissue mets.  [facility] consulted per recommendation by oncology.  CT abdomen and pelvis ordered.  Palliative medicine following for pain management.  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  Pain control on 7/29 but now needs EGD/colonoscopy-- had to be off eliquis so heparin started  09/30/2021: Patient has had EGD and colonoscopy.  Colonoscopy did not reveal any significant findings, however, the preparation was poor.  Upper GI bleed revealed erosive gastritis with bleeding and chronic gastritis.  Patient is currently undergoing radiation.  For radiation today.  Discussed with palliative care team, patient's pain is controlled except particular pain medication needs prior to radiation.  Physical therapy and Occupational Therapy input is appreciated.  Patient's family is asking for hospice input.  Communicated with patient's oncology team, Dr. Earlie Server, and the palliative care team.  Dr. Earlie Server believes that patient is appropriate for  hospice care.  Will pursue disposition once okay with family.  Discussed with patient's family extensively (patient's sister, brother-in-law and another family member).  Patient's sister plans to take patient home.  The family is concerned the patient is a bit sleepy today after radiation therapy (likely secondary to medications used prior to radiation and effect of radiation).  Patient may be stable for discharge in the next 24 hours.  Input from the GI team and the palliative care team is highly appreciated.  Input from the transition of care team is also appreciated.  10/01/2021: Patient seen alongside 2 sisters, brother-in-law and family member.  Patient decided not to pursue radiation today.  Palliative care input is appreciated.  The decision has been made to pursue comfort directed care.  Hospice team has been consulted.  10/02/2021: Patient seen alongside patient's nurse on multiple family members and friends.  Patient is resting quietly.  Patient is comfortable.  Continue comfort directed measures.  Palliative care team is directing patient's pain regimen.  Subjective: -No new complaints.  Objective: Vitals:   09/30/21 2124 10/01/21 0614 10/01/21 1405 10/02/21 1255  BP: 102/74 101/63 (!) 88/59 108/63  Pulse: 95 91 97 (!) 124  Resp: 12 16 16 17   Temp: 97.9 F (36.6 C) 97.6 F (36.4 C) 98 F (36.7 C) 98.7 F (37.1 C)  TempSrc: Oral Oral Oral Oral  SpO2: 97% 99% 99% 98%  Weight:      Height:        Examination:  General: Appearance:     Awake and alert when examined prior to radiation therapy. AAO X 3.     Lungs:      Clear to auscultation.  Heart:    S1S2     Neurologic:   Awake, alert, oriented x 3. No apparent focal neurological           defect.            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 -  Diagnosed with widespread mets in 06/2021.  Had liver biopsy in [place] in 6/23 that showed adenocarcinoma with immunoreactivity not typical for lung cancer.   -CT angio chest showed large right suprahilar mass extending continuously into mediastinum and measuring about 6.3 cm maximal diameter with mets to brain, liver, tail of pancreas, left anterior fifth ribs and multiple vertebral bodies as well as the left breast tissue.  MRI brain concerning for brain mets -Started radiation to the spine and ribs on 7/17>> -Pathology from left arm tissue biopsy on 7/21 shows adenocarcinoma but unclear primary.  -s/p  EGD/colonoscopy after prep complete-- 8/1 -she states Dr Julien Nordmann is her oncologist  09/30/2021: Oncology team is directing care. 10/01/2021: For comfort directed care.  Cancer related pain:  --significant back pain , not able to tolerate XRT well due to pain - palliative care input greatly appreciated, palliative care to continue to adjust analgesics and continue  goals of care discussion  10/01/2021: Pain is controlled.  Palliative care input is highly appreciated.    Left arm swelling: reported on 7/13.  CT left humerus on 7/14 showed 3.7 cm left infraspinatus muscle, 6.2 cm left biceps muscle and other multiple soft tissue masses along the left chest wall and within the left breast soft tissues consistent with metastatic disease  Recent PE/LLE DVT: bilateral PE diagnosed on 08/22/21.  She was discharged on 2 L by [facility].  Now on room air. -continue  eliquis  Anemia of chronic disease: Likely due to malignancy.  Physical  deconditioning -Comfort directed care.         Pressure Injury 09/19/21 Buttocks Right;Left Stage 2 -  Partial thickness loss of dermis presenting as a shallow open injury with a red, pink wound bed without slough. 2 areas of full thickness wounds, NOT pressure (Active)  09/19/21 1950  Location: Buttocks  Location Orientation: Right;Left  Staging: Stage 2 -  Partial thickness loss of dermis presenting as a shallow open injury with a red, pink wound bed without slough.  Wound Description (Comments): 2 areas of full thickness wounds, NOT pressure  Present on Admission: No       DVT prophylaxis:  elquis    Code Status: Full code Family Communication: Family members.  For comfort directed care.     Consultants:  Radiation oncology Palliative medicine Interventional radiology GI  Sch Meds:  Scheduled Meds:  fentaNYL  1 patch Transdermal Q72H   latanoprost  1 drop Both Eyes QHS   lidocaine  1 patch Transdermal Q24H   sodium chloride flush  3 mL Intravenous Q12H   Zinc Oxide   Topical  TID   Continuous Infusions:  sodium chloride Stopped (10/02/21 1649)   sodium chloride      PRN Meds:.acetaminophen **OR** acetaminophen, antiseptic oral rinse, glycopyrrolate **OR** glycopyrrolate **OR** glycopyrrolate, haloperidol **OR** haloperidol **OR** haloperidol lactate, HYDROmorphone (DILAUDID) injection, magnesium hydroxide, [DISCONTINUED] ondansetron **OR** ondansetron (ZOFRAN) IV, ondansetron **OR** ondansetron (ZOFRAN) IV, mouth rinse, polyvinyl alcohol  Antimicrobials: Anti-infectives (From admission, onward)    None        I have personally reviewed the following labs and images: CBC: Recent Labs  Lab 09/30/21 0330  WBC 16.7*  NEUTROABS 14.8*  HGB 8.6*  HCT 26.7*  MCV 74.8*  PLT 93*     BMP &GFR Recent Labs  Lab 09/30/21 0330 10/01/21 0545  NA 134* 135  K 5.5* 6.1*  CL 99 97*  CO2 22 21*  GLUCOSE 106* 104*  BUN 36* 55*  CREATININE 0.92 1.25*   CALCIUM 8.6* 8.9  MG 2.6*  --   PHOS 3.7 3.8     Estimated Creatinine Clearance: 56.8 mL/min (A) (by C-G formula based on SCr of 1.25 mg/dL (H)). Liver & Pancreas: Recent Labs  Lab 10/01/21 0545  ALBUMIN 2.5*     No results for input(s): "LIPASE", "AMYLASE" in the last 168 hours. No results for input(s): "AMMONIA" in the last 168 hours. Diabetic: No results for input(s): "HGBA1C" in the last 72 hours. No results for input(s): "GLUCAP" in the last 168 hours. Cardiac Enzymes: No results for input(s): "CKTOTAL", "CKMB", "CKMBINDEX", "TROPONINI" in the last 168 hours.  No results for input(s): "PROBNP" in the last 8760 hours. Coagulation Profile: No results for input(s): "INR", "PROTIME" in the last 168 hours.  Thyroid Function Tests: No results for input(s): "TSH", "T4TOTAL", "FREET4", "T3FREE", "THYROIDAB" in the last 72 hours.  Lipid Profile: No results for input(s): "CHOL", "HDL", "LDLCALC", "TRIG", "CHOLHDL", "LDLDIRECT" in the last 72 hours. Anemia Panel: No results for input(s): "VITAMINB12", "FOLATE", "FERRITIN", "TIBC", "IRON", "RETICCTPCT" in the last 72 hours. Urine analysis:    Component Value Date/Time   COLORURINE AMBER (A) 09/26/2021 0019   APPEARANCEUR CLOUDY (A) 09/26/2021 0019   LABSPEC 1.021 09/26/2021 0019   PHURINE 5.0 09/26/2021 0019   GLUCOSEU NEGATIVE 09/26/2021 0019   HGBUR SMALL (A) 09/26/2021 0019   BILIRUBINUR NEGATIVE 09/26/2021 0019   KETONESUR 5 (A) 09/26/2021 0019   PROTEINUR 30 (A) 09/26/2021 0019   UROBILINOGEN 1.0 02/14/2010 0600   NITRITE NEGATIVE 09/26/2021 0019   LEUKOCYTESUR MODERATE (A) 09/26/2021 0019   Sepsis Labs: Invalid input(s): "PROCALCITONIN", "LACTICIDVEN"  Microbiology: Recent Results (from the past 240 hour(s))  Urine Culture     Status: Abnormal   Collection Time: 09/26/21  5:14 PM   Specimen: Urine, Clean Catch  Result Value Ref Range Status   Specimen Description   Final    URINE, CLEAN CATCH Performed at  [facility], [place] [address] [place], [place] [address]    Special Requests   Final    NONE Performed at [facility], [place] [address] [place], [place] [address]    Culture (A)  Final    80,000 COLONIES/mL DIPHTHEROIDS(CORYNEBACTERIUM SPECIES) Standardized susceptibility testing for this organism is not available. Performed at [facility], [place] [address] [place],  [address]    Report Status 09/27/2021 FINAL  Final    Radiology Studies: No results found.   Dana Allan, MD [facility]  If 7PM-7AM, please contact night-coverage [email or web address] 10/02/2021, 5:53 PM

## 2021-10-03 ENCOUNTER — Ambulatory Visit: Payer: BLUE CROSS/BLUE SHIELD

## 2021-10-03 DIAGNOSIS — R0789 Other chest pain: Secondary | ICD-10-CM | POA: Diagnosis not present

## 2021-10-03 NOTE — Progress Notes (Signed)
Patient [name]      DOB: March 05, 1965      GOV:[id number]      Palliative Medicine Team    Subjective: Bedside symptom check completed. Two family members present at time of visit.   Physical exam: Patient sitting up in recliner at time of visit. Breathing even and non-labored with nasal cannula applied, no excessive secretions noted. Patient endorsing pain in arm and "all over" during my assessment. Patient without additional non-verbal signs of pain or discomfort at this time.    Assessment and plan: This RN touched base with bedside RN. She endorses that the patient has been minimally interactive for most of the day and has just started opening eyes and speaking with family/staff. Discussed analgesic bolus if patient has ongoing complaints of pain, basal drip rate increased slightly. Plan to try to find balance of pain management while allowing interaction with family bedside. Will continue to follow for any changes or advances.    Thank you for allowing the Palliative Medicine Team to assist in the care of this patient.     Damian Leavell, MSN, RN Palliative Medicine Team Team Phone: [phone]  This phone is monitored 7a-7p, please reach out to attending physician outside of these hours for urgent needs.

## 2021-10-03 NOTE — Plan of Care (Signed)
  Problem: Clinical Measurements: Goal: Ability to maintain clinical measurements within normal limits will improve Outcome: Progressing Goal: Will remain free from infection Outcome: Progressing Goal: Diagnostic test results will improve Outcome: Not Progressing Goal: Respiratory complications will improve Outcome: Progressing   Problem: Activity: Goal: Risk for activity intolerance will decrease Outcome: Progressing   Problem: Nutrition: Goal: Adequate nutrition will be maintained Outcome: Not Progressing   Problem: Elimination: Goal: Will not experience complications related to bowel motility Outcome: Not Progressing Goal: Will not experience complications related to urinary retention Outcome: Progressing   Problem: Pain Managment: Goal: General experience of comfort will improve Outcome: Progressing   Problem: Safety: Goal: Ability to remain free from injury will improve Outcome: Progressing   Problem: Skin Integrity: Goal: Risk for impaired skin integrity will decrease Outcome: Not Progressing

## 2021-10-03 NOTE — Progress Notes (Signed)
PROGRESS NOTE  [name] ZGY:[id number] DOB: May 04, 1965   PCP: Pcp, No  Patient is from: Home  DOA: 09/14/2021 LOS: 90  Chief complaints Chief Complaint  Patient presents with   Chest Pain     Brief Narrative / Interim history: 56 year old F with PMH of stage Ib non-small lung cancer s/p RUL lobectomy in 2020 followed by adjuvant treatment, recent diagnosis of widespread metastatic adenocarcinoma with unclear primary, recent hospitalization from 6/28-7/3 for acute respiratory failure with hypoxia in the setting of acute PE and and cancer related pain returning with cancer related pain that was not controlled with home regimen, and admitted for the same.  Patient was discharged on 2 L by [facility] last hospitalization.  Patient underwent US-guided biopsy of LUE soft tissue mass on 7/21.  Pathology showed adenocarcinoma of unclear primary.  MRI brain concerning for brain mets and extracranial soft tissue mets.  [facility] consulted per recommendation by oncology.  CT abdomen and pelvis ordered.  Palliative medicine following for pain management.  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  Pain control on 7/29 but now needs EGD/colonoscopy-- had to be off eliquis so heparin started  09/30/2021: Patient has had EGD and colonoscopy.  Colonoscopy did not reveal any significant findings, however, the preparation was poor.  Upper GI bleed revealed erosive gastritis with bleeding and chronic gastritis.  Patient is currently undergoing radiation.  For radiation today.  Discussed with palliative care team, patient's pain is controlled except particular pain medication needs prior to radiation.  Physical therapy and Occupational Therapy input is appreciated.  Patient's family is asking for hospice input.  Communicated with patient's oncology team, Dr. Earlie Server, and the palliative care team.  Dr. Earlie Server believes that patient is appropriate for  hospice care.  Will pursue disposition once okay with family.  Discussed with patient's family extensively (patient's sister, brother-in-law and another family member).  Patient's sister plans to take patient home.  The family is concerned the patient is a bit sleepy today after radiation therapy (likely secondary to medications used prior to radiation and effect of radiation).  Patient may be stable for discharge in the next 24 hours.  Input from the GI team and the palliative care team is highly appreciated.  Input from the transition of care team is also appreciated.  10/01/2021: Patient seen alongside 2 sisters, brother-in-law and family member.  Patient decided not to pursue radiation today.  Palliative care input is appreciated.  The decision has been made to pursue comfort directed care.  Hospice team has been consulted.  10/02/2021: Patient seen alongside patient's nurse on multiple family members and friends.  Patient is resting quietly.  Patient is comfortable.  Continue comfort directed measures.  Palliative care team is directing patient's pain regimen.  10/03/2021: Patient seen alongside patient's sister and brother-in-law.  Patient is resting quietly.  Input from palliative care team is appreciated.  She is comfortable.  Subjective: -No new complaints. -Patient is comfortable.  Objective: Vitals:   10/01/21 1405 10/02/21 1255 10/03/21 0458 10/03/21 1314  BP: (!) 88/59 108/63 127/80 122/85  Pulse: 97 (!) 124 (!) 128 (!) 140  Resp: 16 17 17 18   Temp: 98 F (36.7 C) 98.7 F (37.1 C) 98.3 F (36.8 C) 99.3 F (37.4 C)  TempSrc: Oral Oral Oral Oral  SpO2: 99% 98% 100% 99%  Weight:      Height:        Examination:  General: Appearance:  Awake and alert when examined prior to radiation therapy. AAO X 3.     Lungs:      Clear to auscultation.   Heart:    S1S2     Neurologic:   Awake, alert, oriented x 3. No apparent focal neurological           defect.             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 -  Diagnosed with widespread mets in 06/2021.  Had liver biopsy in [place] in 6/23 that showed adenocarcinoma with immunoreactivity not typical for lung cancer.   -CT angio chest showed large right suprahilar mass extending continuously into mediastinum and measuring about 6.3 cm maximal diameter with mets to brain, liver, tail of pancreas, left anterior fifth ribs and multiple vertebral bodies as well as the left breast tissue.  MRI brain concerning for brain mets -Started radiation to the spine and ribs on 7/17>> -Pathology from left arm tissue biopsy on 7/21 shows adenocarcinoma but unclear primary.  -s/p  EGD/colonoscopy after prep complete-- 8/1 -she states Dr Julien Nordmann is her oncologist  09/30/2021: Oncology team is directing care. 10/03/2021: For comfort directed care.  Patient is comfortable.  Cancer related pain:  --significant back pain , not able to tolerate XRT well due to pain - palliative care input greatly appreciated, palliative care to continue to adjust analgesics and continue  goals of care discussion  10/01/2021: Pain is controlled.  Palliative care input is highly appreciated.    Left arm swelling: reported on 7/13.  CT left humerus on 7/14 showed 3.7 cm left infraspinatus muscle, 6.2 cm left biceps muscle and other multiple soft tissue masses along the left chest wall and within the left breast soft  tissues consistent with metastatic disease  Recent PE/LLE DVT: bilateral PE diagnosed on 08/22/21.  She was discharged on 2 L by [facility].  Now on room air. -continue  eliquis  Anemia of chronic disease: Likely due to malignancy.  Physical deconditioning -Comfort directed care.         Pressure Injury 09/19/21 Buttocks Right;Left Stage 2 -  Partial thickness loss of dermis presenting as a shallow open injury with a red, pink wound bed without slough. 2 areas of full thickness wounds, NOT pressure (Active)  09/19/21 1950  Location: Buttocks  Location Orientation: Right;Left  Staging: Stage 2 -  Partial thickness loss of dermis presenting as a shallow open injury with a red, pink wound bed without slough.  Wound Description (Comments): 2 areas of full thickness wounds, NOT pressure  Present on Admission: No       DVT prophylaxis:  elquis    Code Status: Full code Family Communication: Family members.  For comfort directed care.     Consultants:  Radiation oncology Palliative medicine Interventional radiology GI  Sch Meds:  Scheduled Meds:  fentaNYL  1 patch Transdermal Q72H   latanoprost  1 drop Both Eyes QHS   lidocaine  1 patch Transdermal Q24H   sodium chloride flush  3 mL Intravenous Q12H   Zinc Oxide   Topical TID   Continuous Infusions:  sodium chloride Stopped (10/02/21 1649)   sodium chloride 20 mL/hr at 10/02/21 1927   HYDROmorphone 1.75 mg/hr (10/03/21 1510)    PRN Meds:.acetaminophen **OR** acetaminophen, antiseptic oral rinse, glycopyrrolate **OR** glycopyrrolate **OR** glycopyrrolate, haloperidol **OR** haloperidol **OR** haloperidol lactate, HYDROmorphone (DILAUDID) injection, LORazepam, magnesium hydroxide, [DISCONTINUED] ondansetron **OR** ondansetron (ZOFRAN) IV, ondansetron **OR** ondansetron (ZOFRAN) IV, mouth rinse, polyvinyl alcohol  Antimicrobials: Anti-infectives (From admission, onward)    None        I have personally reviewed the  following labs and images: CBC: Recent Labs  Lab 09/30/21 0330  WBC 16.7*  NEUTROABS 14.8*  HGB 8.6*  HCT 26.7*  MCV 74.8*  PLT 93*     BMP &GFR Recent Labs  Lab 09/30/21 0330 10/01/21 0545  NA 134* 135  K 5.5* 6.1*  CL 99 97*  CO2 22 21*  GLUCOSE 106* 104*  BUN 36* 55*  CREATININE 0.92 1.25*  CALCIUM 8.6* 8.9  MG 2.6*  --   PHOS 3.7 3.8     Estimated Creatinine Clearance: 56.8 mL/min (A) (by C-G formula based on SCr of 1.25 mg/dL (H)). Liver & Pancreas: Recent Labs  Lab 10/01/21 0545  ALBUMIN 2.5*     No results for input(s): "LIPASE", "AMYLASE" in the last 168 hours. No results for input(s): "AMMONIA" in the last 168 hours. Diabetic: No results for input(s): "HGBA1C" in the last 72 hours. No results for input(s): "GLUCAP" in the last 168 hours. Cardiac Enzymes: No results for input(s): "CKTOTAL", "CKMB", "CKMBINDEX", "TROPONINI" in the last 168 hours.  No results for input(s): "PROBNP" in the last 8760 hours. Coagulation Profile: No results for input(s): "INR", "PROTIME" in the last 168 hours.  Thyroid Function Tests: No results for input(s): "TSH", "T4TOTAL", "FREET4", "T3FREE", "THYROIDAB" in the last 72 hours.  Lipid Profile: No results for input(s): "CHOL", "HDL", "LDLCALC", "TRIG", "CHOLHDL", "LDLDIRECT" in the last 72 hours. Anemia Panel: No results for input(s): "VITAMINB12", "FOLATE", "FERRITIN", "TIBC", "IRON", "RETICCTPCT" in the last 72 hours. Urine analysis:    Component Value Date/Time   COLORURINE AMBER (A) 09/26/2021 0019   APPEARANCEUR CLOUDY (A) 09/26/2021 0019   LABSPEC 1.021 09/26/2021 0019   PHURINE 5.0 09/26/2021 0019   GLUCOSEU NEGATIVE 09/26/2021 0019   HGBUR SMALL (A) 09/26/2021 0019   BILIRUBINUR NEGATIVE 09/26/2021 0019   KETONESUR 5 (A) 09/26/2021 0019   PROTEINUR 30 (A) 09/26/2021 0019   UROBILINOGEN 1.0 02/14/2010 0600   NITRITE NEGATIVE 09/26/2021 0019   LEUKOCYTESUR MODERATE (A) 09/26/2021 0019   Sepsis  Labs: Invalid input(s): "PROCALCITONIN", "LACTICIDVEN"  Microbiology: Recent Results (from the past 240 hour(s))  Urine Culture     Status: Abnormal   Collection Time: 09/26/21  5:14 PM   Specimen: Urine, Clean Catch  Result Value Ref Range Status   Specimen Description   Final    URINE, CLEAN CATCH Performed at [facility], [place] [address] [place], [place] [address]    Special Requests   Final    NONE Performed at [facility], [place] [address], [place], [place] [address]    Culture (A)  Final    80,000 COLONIES/mL DIPHTHEROIDS(CORYNEBACTERIUM SPECIES) Standardized susceptibility testing for this organism is not available. Performed at [facility], [place] [address] [place], [place] [address]  Report Status 09/27/2021 FINAL  Final    Radiology Studies: No results found.   Dana Allan, MD [facility]  If 7PM-7AM, please contact night-coverage [email or web address] 10/03/2021, 5:50 PM

## 2021-10-04 ENCOUNTER — Ambulatory Visit: Payer: BLUE CROSS/BLUE SHIELD

## 2021-10-04 DIAGNOSIS — R0789 Other chest pain: Secondary | ICD-10-CM | POA: Diagnosis not present

## 2021-10-04 MED ORDER — ORAL CARE MOUTH RINSE
15.0000 mL | OROMUCOSAL | Status: DC
  Administered 2021-10-04 (×3): 15 mL via OROMUCOSAL

## 2021-10-04 MED ORDER — ORAL CARE MOUTH RINSE: 15.0000 mL | OROMUCOSAL | Status: DC | PRN

## 2021-10-04 NOTE — Plan of Care (Signed)
  Problem: Pain Managment: Goal: General experience of comfort will improve Outcome: Progressing   Problem: Safety: Goal: Ability to remain free from injury will improve Outcome: Progressing   

## 2021-10-04 NOTE — Progress Notes (Signed)
PROGRESS NOTE  [name] AYT:[id number] DOB: 1965-09-13   PCP: Pcp, No  Patient is from: Home  DOA: 09/07/2021 LOS: 64  Chief complaints Chief Complaint  Patient presents with   Chest Pain     Brief Narrative / Interim history: Patient is a 56 year old F with PMH of stage Ib non-small lung cancer s/p RUL lobectomy in 2020 followed by adjuvant treatment, recent diagnosis of widespread metastatic adenocarcinoma with unclear primary, recent hospitalization from 6/28-7/3 for acute respiratory failure with hypoxia in the setting of acute PE and and cancer related pain returning with cancer related pain that was not controlled with home regimen, and admitted for the same.  Patient was discharged on 2 L by [place] last hospitalization.  Patient underwent US-guided biopsy of LUE soft tissue mass on 7/21.  Pathology showed adenocarcinoma of unclear primary.  MRI brain concerning for brain mets and extracranial soft tissue mets.  [facility] consulted per recommendation by oncology.  CT abdomen and pelvis ordered.  Palliative medicine following for pain management.  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  Pain control on 7/29 but now needs EGD/colonoscopy-- had to be off eliquis so heparin started  09/30/2021: Patient has had EGD and colonoscopy.  Colonoscopy did not reveal any significant findings, however, the preparation was poor.  Upper GI bleed revealed erosive gastritis with bleeding and chronic gastritis.  Patient is currently undergoing radiation.  For radiation today.  Discussed with palliative care team, patient's pain is controlled except particular pain medication needs prior to radiation.  Physical therapy and Occupational Therapy input is appreciated.  Patient's family is asking for hospice input.  Communicated with patient's oncology team, Dr. Earlie Server, and the palliative care team.  Dr. Earlie Server believes that patient is  appropriate for hospice care.  Will pursue disposition once okay with family.  Discussed with patient's family extensively (patient's sister, brother-in-law and another family member).  Patient's sister plans to take patient home.  The family is concerned the patient is a bit sleepy today after radiation therapy (likely secondary to medications used prior to radiation and effect of radiation).  Patient may be stable for discharge in the next 24 hours.  Input from the GI team and the palliative care team is highly appreciated.  Input from the transition of care team is also appreciated.  10/01/2021: Patient seen alongside 2 sisters, brother-in-law and family member.  Patient decided not to pursue radiation today.  Palliative care input is appreciated.  The decision has been made to pursue comfort directed care.  Hospice team has been consulted.  10/02/2021: Patient seen alongside patient's nurse on multiple family members and friends.  Patient is resting quietly.  Patient is comfortable.  Continue comfort directed measures.  Palliative care team is directing patient's pain regimen.  10/03/2021: Patient seen alongside patient's sister and brother-in-law.  Patient is resting quietly.  Input from palliative care team is appreciated.  She is comfortable.  10/04/2021: Patient is comfort directed measures only.  Patient is comfortable.  Patient is actively dying.  Patient was seen alongside patient's sister and brother-in-law and 2 other ladies.  Subjective: -No new complaints. -Patient is comfortable.  Objective: Vitals:   10/01/21 1405 10/02/21 1255 10/03/21 0458 10/03/21 1314  BP: (!) 88/59 108/63 127/80 122/85  Pulse: 97 (!) 124 (!) 128 (!) 140  Resp: 16 17 17 18   Temp:  98.7 F (37.1 C) 98.3 F (36.8 C) 99.3 F (37.4 C)  TempSrc: Oral Oral Oral Oral  SpO2: 99% 98% 100% 99%  Weight:      Height:        Examination:  General: Appearance:     Awake and alert when examined prior to radiation  therapy. AAO X 3.     Lungs:      Clear to auscultation.   Heart:    S1S2     Neurologic:   Awake, alert, oriented x 3. No apparent focal neurological           defect.            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 -  Diagnosed with widespread mets in 06/2021.  Had liver biopsy in [place] in 6/23 that showed adenocarcinoma with immunoreactivity not typical for lung cancer.   -CT angio chest showed large right suprahilar mass extending continuously into mediastinum and measuring about 6.3 cm maximal diameter with mets to brain, liver, tail of pancreas, left anterior fifth ribs and multiple vertebral bodies as well as the left breast tissue.  MRI brain concerning for brain mets -Started radiation to the spine and ribs on 7/17>> -Pathology from left arm tissue biopsy on 7/21 shows adenocarcinoma but unclear primary.  -s/p  EGD/colonoscopy after prep complete-- 8/1 -she states Dr Julien Nordmann is her oncologist  09/30/2021: Oncology team is directing care. 10/04/2021: For comfort directed care.  Patient is comfortable.  Cancer related pain:  --significant back pain , not able to tolerate XRT well due to pain - palliative care input greatly appreciated, palliative care to continue to adjust analgesics and continue  goals of care discussion  10/01/2021: Pain is controlled.  Palliative care input is highly appreciated.    Left arm swelling: reported on 7/13.  CT  left humerus on 7/14 showed 3.7 cm left infraspinatus muscle, 6.2 cm left biceps muscle and other multiple soft tissue masses along the left chest wall and within the left breast soft tissues consistent with metastatic disease  Recent PE/LLE DVT: bilateral PE diagnosed on 08/22/21.  She was discharged on 2 L by [place].  Now on room air. -continue  eliquis  Anemia of chronic disease: Likely due to malignancy.  Physical deconditioning -Comfort directed care.         Pressure Injury 09/19/21 Buttocks Right;Left Stage 2 -  Partial thickness loss of dermis presenting as a shallow open injury with a red, pink wound bed without slough. 2 areas of full thickness wounds, NOT pressure (Active)  09/19/21 1950  Location: Buttocks  Location Orientation: Right;Left  Staging: Stage 2 -  Partial thickness loss of dermis presenting as a shallow open injury with a red, pink wound bed without slough.  Wound Description (Comments): 2 areas of full thickness wounds, NOT pressure  Present on Admission: No       DVT prophylaxis:  elquis    Code Status: Full code Family Communication: Family members.  For comfort directed care.     Consultants:  Radiation oncology Palliative medicine Interventional radiology GI  Sch Meds:  Scheduled Meds:  fentaNYL  1 patch Transdermal Q72H   latanoprost  1 drop Both Eyes QHS   lidocaine  1 patch Transdermal Q24H   mouth rinse  15 mL Mouth Rinse 4 times per day   sodium chloride flush  3 mL Intravenous Q12H   Zinc Oxide   Topical TID   Continuous Infusions:  sodium chloride Stopped (10/02/21 1649)   sodium chloride 20 mL/hr at 10/02/21 1927   HYDROmorphone 1.75 mg/hr (10/04/21 0251)    PRN Meds:.acetaminophen **OR** acetaminophen, antiseptic oral rinse, glycopyrrolate **OR** glycopyrrolate **OR** glycopyrrolate, haloperidol **OR** haloperidol **OR** haloperidol lactate, HYDROmorphone (DILAUDID) injection, LORazepam, magnesium hydroxide, [DISCONTINUED]  ondansetron **OR** ondansetron (ZOFRAN) IV, ondansetron **OR** ondansetron (ZOFRAN) IV, mouth rinse, mouth rinse, polyvinyl alcohol  Antimicrobials: Anti-infectives (From admission, onward)    None        I have personally reviewed the following labs and images: CBC: Recent Labs  Lab 09/30/21 0330  WBC 16.7*  NEUTROABS 14.8*  HGB 8.6*  HCT 26.7*  MCV 74.8*  PLT 93*     BMP &GFR Recent Labs  Lab 09/30/21 0330 10/01/21 0545  NA 134* 135  K 5.5* 6.1*  CL 99 97*  CO2 22 21*  GLUCOSE 106* 104*  BUN 36* 55*  CREATININE 0.92 1.25*  CALCIUM 8.6* 8.9  MG 2.6*  --   PHOS 3.7 3.8     Estimated Creatinine Clearance: 56.8 mL/min (A) (by C-G formula based on SCr of 1.25 mg/dL (H)). Liver & Pancreas: Recent Labs  Lab 10/01/21 0545  ALBUMIN 2.5*     No results for input(s): "LIPASE", "AMYLASE" in the last 168 hours. No results for input(s): "AMMONIA" in the last 168 hours. Diabetic: No results for input(s): "HGBA1C" in the last 72 hours. No results for input(s): "GLUCAP" in the last 168 hours. Cardiac Enzymes: No results for input(s): "CKTOTAL", "CKMB", "CKMBINDEX", "TROPONINI" in the last 168 hours.  No results for input(s): "PROBNP" in the last 8760 hours. Coagulation Profile: No results for input(s): "INR", "PROTIME" in the last 168 hours.  Thyroid Function Tests: No results for input(s): "TSH", "T4TOTAL", "FREET4", "T3FREE", "THYROIDAB" in the last 72 hours.  Lipid Profile: No results for input(s): "CHOL", "HDL", "LDLCALC", "TRIG", "CHOLHDL", "LDLDIRECT" in the last 72 hours. Anemia Panel: No results for input(s): "VITAMINB12", "FOLATE", "FERRITIN", "TIBC", "IRON", "RETICCTPCT" in the last 72 hours. Urine analysis:    Component Value Date/Time   COLORURINE AMBER (A) 09/26/2021 0019   APPEARANCEUR CLOUDY (A) 09/26/2021 0019   LABSPEC 1.021 09/26/2021 0019   PHURINE 5.0 09/26/2021 0019   GLUCOSEU NEGATIVE 09/26/2021 0019   HGBUR SMALL (A) 09/26/2021  0019   BILIRUBINUR NEGATIVE 09/26/2021 0019   KETONESUR 5 (A) 09/26/2021 0019   PROTEINUR 30 (A) 09/26/2021 0019   UROBILINOGEN 1.0 02/14/2010 0600   NITRITE NEGATIVE 09/26/2021 0019   LEUKOCYTESUR MODERATE (A) 09/26/2021 0019   Sepsis Labs: Invalid input(s): "PROCALCITONIN", "LACTICIDVEN"  Microbiology: Recent Results (from the past 240 hour(s))  Urine Culture     Status: Abnormal   Collection Time: 09/26/21  5:14 PM   Specimen: Urine, Clean Catch  Result Value Ref Range Status   Specimen Description   Final    URINE, CLEAN CATCH Performed at [facility], [place] [address], [place],  [address]    Special Requests   Final    NONE Performed at [facility]  [place] [address], [place], [place] [address]    Culture (A)  Final    80,000 COLONIES/mL DIPHTHEROIDS(CORYNEBACTERIUM SPECIES) Standardized susceptibility testing for this organism is not available. Performed at [facility], [place] [address], [place], [place] [address]    Report Status 09/27/2021 FINAL  Final    Radiology Studies: No results found.   Dana Allan, MD [facility]  If 7PM-7AM, please contact night-coverage [email or web address] 10/04/2021, 12:56 PM

## 2021-10-04 NOTE — Progress Notes (Signed)
5 ml (Dilaudid 25 mg in NaCl 0.9% ) wasted in steri cycle with RN Manuela Schwartz.

## 2021-10-04 NOTE — Progress Notes (Signed)
Palliative Medicine Progress Note   Patient Name: [name]       Date: 10/04/2021 DOB: 1965-12-05  Age: 56 y.o. MRN#: [id number] Attending Physician: Bonnell Public, MD Primary Care Physician: Pcp, No Admit Date: 09/13/2021  Reason for Consultation/Follow-up: End-of-life care, symptom management  HPI/Patient Profile: 56 year old F with PMH of 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  Patient was discharged on 2 L by [place] last hospitalization.    Radiation oncology consulted, and patient started radiation treatment on 7/17.  Pain control remains barriers to discharge.  Oncology and palliative medicine consulted.    Patient underwent US-guided biopsy of LUE soft tissue mass on 7/21.  Pathology pending. Palliative medicine following for pain management   Subjective: Chart reviewed.  Family at bedside, patient is not awake not alert, but appears comfortable. She is on Dilaudid infusion. End of life signs and symptoms discussed with family at bedside.     Objective:  Physical Exam Vitals reviewed.  Constitutional:      General: She is sleeping. She is not in acute distress.    Appearance: She is ill-appearing.  Pulmonary:     Effort: Pulmonary effort is normal.  Neurological:     Mental Status: She is lethargic.             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)   Pain from bone metastases (HCC)   Cancer related pain   Lactic acidosis   Anemia of  chronic disease   Bandemia   Sinus tachycardia   Metastasis to brain (HCC)   Pressure injury of skin    Recommendations/Plan: Continue comfort measures DNR/DNI   Dilaudid infusion and bolus as needed Anticipate hospital death PMT will continue to support  Symptom Management:  Dilaudid prn for pain or dyspnea Haloperidol (HALDOL) prn for agitation  Glycopyrrolate (ROBINUL) for excessive secretions Ondansetron (ZOFRAN) prn for nausea Polyvinyl alcohol (LIQUIFILM TEARS) prn for dry eyes Antiseptic oral rinse (BIOTENE) prn for dry mouth  Prognosis:  Hours to 1-2 days.   Discharge Planning: Anticipate hospital death. Actively dying, not stable for transport to hospice.    Thank you for allowing the Palliative Medicine Team to assist in the care of this patient.   MDM - moderate   Loistine Chance, MD   Please contact Palliative Medicine Team phone at [phone] for questions and concerns.  For individual providers, please see AMION.

## 2021-10-04 NOTE — Progress Notes (Signed)
[place] Note  Covering for [facility] chaplains, coordinated with close friends and family per their request to offer simple prayer service in chapel to support [name] (who goes by [name].") Chapel was full of [name]'s loved ones, who shared wishes and blessings for her, praying, singing, and blessing a prayer shawl to [place] afterward with their healing intentions for her. Family and friends verbalized deep appreciation for the sacred space and support provided.  Provided empathic listening, witness to family's stories and bonds, pastoral reflection, emotional support, affirmation of strengths, and handmade prayer shawl.  Family is aware of ongoing chaplain availability, should additional needs arise or circumstances change.   [place], [place], [facility] 24/7 pager [phone]

## 2021-10-07 ENCOUNTER — Ambulatory Visit: Payer: BLUE CROSS/BLUE SHIELD

## 2021-10-08 ENCOUNTER — Ambulatory Visit: Payer: BLUE CROSS/BLUE SHIELD

## 2021-10-09 ENCOUNTER — Ambulatory Visit: Payer: BLUE CROSS/BLUE SHIELD

## 2021-10-10 ENCOUNTER — Ambulatory Visit: Payer: BLUE CROSS/BLUE SHIELD

## 2021-10-11 ENCOUNTER — Ambulatory Visit: Payer: BLUE CROSS/BLUE SHIELD

## 2021-10-14 ENCOUNTER — Ambulatory Visit: Payer: BLUE CROSS/BLUE SHIELD

## 2021-10-25 NOTE — Progress Notes (Signed)
[name] admitted with metastatic adenocarcinoma without primary source identified.  Her workup/treatment was complicated with severe unrelieved pain issues. She transitioned to comfort care and passed away today 10/26/21 at 0618 am. Death verification performed by 2 RNs  Kathlene Cote NP

## 2021-10-25 NOTE — Discharge Summary (Signed)
Physician Discharge Summary   Patient: [name] MRN: [id number] DOB: 08-09-1965  Admit date:     2021/09/13  Discharge date: Oct 15, 2021  Discharge Physician: Bonnell Public   PCP: Pcp, No   Death discharge summary:  Admitted: Sep 13, 2021 Patient died on: 15-Oct-2021 (06.18 am).   Death discharge Diagnoses: Metastatic adenocarcinoma ([place]) Metastasis to brain [facility]) Metastases to the liver [facility]) Chest wall pain Pulmonary embolism (HCC) Intractable pain Pain from bone metastases (HCC) Cancer related pain Lactic acidosis Anemia of chronic disease Bandemia Sinus tachycardia Pressure injury of skin   Hospital Course: Patient was a 56 year old African-American female with past medical history significant for stage Ib non-small lung cancer s/p RUL lobectomy in 2020 followed by adjuvant treatment, recent diagnosis of widespread metastatic adenocarcinoma with unclear primary, recent hospitalization from 6/28-08/26/2021 for acute respiratory failure with hypoxia in the setting of acute PE and and cancer related pain.  Patient was readmitted with cancer related pain that was not controlled with home regimen.  Oncology team and palliative care team were consulted to assist with patient's management.  Attempts were made to optimize pain control.  Patient also underwent radiation treatment.  During the hospital stay, patient underwent US-guided biopsy of LUE soft tissue mass on 09/13/2021.  Pathology showed adenocarcinoma of unclear primary.  MRI brain was concerning for brain mets and extracranial soft tissue mets.  Oncology team recommended gastroenterology consultation.  Patient underwent EGD and colonoscopy that were unrevealing.  Radiation oncology consulted, and patient underwent radiation treatment for spinal and rib metastasis.  After extensive discussion with the patient and the family, comfort directed care was elected.  Patient died on 2021/10/15 at 6:18 AM.    Consultants: Oncology,  palliative care, GI and radiation oncology team. Procedures performed: Radiation treatment to the spine and rib  Imaging Studies: VAS [place] LOWER EXTREMITY VENOUS (DVT)  Result Date: 09/25/2021  Lower Venous DVT Study Patient Name:  [name] [facility]  Date of Exam:   09/25/2021 Medical Rec #: [id number]         Accession #:    [id number] Date of Birth: [birth year]          Patient Gender: F Patient Age:   42 years Exam Location:  [facility] Procedure:      VAS [place] LOWER EXTREMITY VENOUS (DVT) Referring Phys: Annamaria Boots XU --------------------------------------------------------------------------------  Indications: Edema.  Risk Factors: Cancer. Limitations: Poor ultrasound/tissue interface. Comparison Study: No prior studies. Performing Technologist: Oliver Hum RVT  Examination Guidelines: A complete evaluation includes B-mode imaging, spectral Doppler, color Doppler, and power Doppler as needed of all accessible portions of each vessel. Bilateral testing is considered an integral part of a complete examination. Limited examinations for reoccurring indications may be performed as noted. The reflux portion of the exam is performed with the patient in reverse Trendelenburg.  +---------+---------------+---------+-----------+----------+--------------+ RIGHT    CompressibilityPhasicitySpontaneityPropertiesThrombus Aging +---------+---------------+---------+-----------+----------+--------------+ CFV      Full           Yes      Yes                                 +---------+---------------+---------+-----------+----------+--------------+ SFJ      Full                                                        +---------+---------------+---------+-----------+----------+--------------+  FV Prox  Full                                                        +---------+---------------+---------+-----------+----------+--------------+ FV Mid   Full                                                         +---------+---------------+---------+-----------+----------+--------------+ FV DistalFull                                                        +---------+---------------+---------+-----------+----------+--------------+ PFV      Full                                                        +---------+---------------+---------+-----------+----------+--------------+ POP      Full           Yes      Yes                                 +---------+---------------+---------+-----------+----------+--------------+ PTV      Full                                                        +---------+---------------+---------+-----------+----------+--------------+ PERO     Full                                                        +---------+---------------+---------+-----------+----------+--------------+   +---------+---------------+---------+-----------+----------+-----------------+ LEFT     CompressibilityPhasicitySpontaneityPropertiesThrombus Aging    +---------+---------------+---------+-----------+----------+-----------------+ CFV      Full           Yes      Yes                                    +---------+---------------+---------+-----------+----------+-----------------+ SFJ      Full                                                           +---------+---------------+---------+-----------+----------+-----------------+ FV Prox  Full                                                           +---------+---------------+---------+-----------+----------+-----------------+  FV Mid   Full                                                           +---------+---------------+---------+-----------+----------+-----------------+ FV DistalFull                                                           +---------+---------------+---------+-----------+----------+-----------------+ PFV      Full                                                            +---------+---------------+---------+-----------+----------+-----------------+ POP      Full           Yes      Yes                                    +---------+---------------+---------+-----------+----------+-----------------+ PTV      Full                                                           +---------+---------------+---------+-----------+----------+-----------------+ PERO     Full                                                           +---------+---------------+---------+-----------+----------+-----------------+ Gastroc  Partial                                      Age Indeterminate +---------+---------------+---------+-----------+----------+-----------------+     Summary: RIGHT: - There is no evidence of deep vein thrombosis in the lower extremity. However, portions of this examination were limited- see technologist comments above.  - No cystic structure found in the popliteal fossa.  LEFT: - Findings consistent with age indeterminate deep vein thrombosis involving the left gastrocnemius veins. - No cystic structure found in the popliteal fossa.  *See table(s) above for measurements and observations. Electronically signed by Orlie Pollen on 09/25/2021 at 7:29:51 PM.    Final    CT ABDOMEN PELVIS W CONTRAST  Result Date: 09/20/2021 CLINICAL DATA:  Metastatic adenocarcinoma. Known brain metastases (stage IV). Additional metastatic disease evaluation. EXAM: CT ABDOMEN AND PELVIS WITH CONTRAST TECHNIQUE: Multidetector CT imaging of the abdomen and pelvis was performed using the standard protocol following bolus administration of intravenous contrast. RADIATION DOSE REDUCTION: This exam was performed according to the departmental dose-optimization program which includes automated exposure control, adjustment of the mA and/or kV according to patient size and/or use of iterative reconstruction  technique. CONTRAST:  122mL OMNIPAQUE IOHEXOL 300 MG/ML  SOLN COMPARISON:  Partial  comparison to CTA chest dated 08/22/2021 FINDINGS: Lower chest: Patchy left lower lobe opacity, atelectasis versus pneumonia, mildly improved. Scattered subcentimeter pulmonary metastases. Mediastinal nodal metastases. Left chest wall, left breast/axilla, and right posterior back soft tissue metastases. Index lesion in the left anterior chest wall measures 6.7 cm. Hepatobiliary: Innumerable hepatic metastases throughout both lobes. Index lesion in segment 5 measures 4.4 cm. Status post cholecystectomy. No intrahepatic or extrahepatic duct dilatation. Pancreas: Multiple pancreatic masses, including in the pancreatic tail, measure up to 2.7 cm. Spleen: Within normal limits. Adrenals/Urinary Tract: Bilateral adrenal glands are within normal limits. Suspected bilateral renal metastases, measuring up to 16 mm in the right upper kidney (series 2/image 32). 6.0 cm right lower pole renal cyst, benign. No hydronephrosis. Bladder is within normal limits. Stomach/Bowel: Stomach is within normal limits. No evidence of bowel obstruction. Normal appendix (series 2/image 64). Status post left hemicolectomy. No colonic wall thickening or mass is evident on CT. Vascular/Lymphatic: No evidence of abdominal aortic aneurysm. No suspicious abdominopelvic lymphadenopathy. Reproductive: Status post hysterectomy. Bilateral ovaries are not discretely visualized. Other: No abdominopelvic ascites. 18 mm soft tissue implant in the left lower pelvis (series 2/image 81), possibly reflecting a peritoneal implant. Musculoskeletal: Multiple soft tissue implants in the subcutaneous tissues, measuring up to 16 mm. 5.4 cm right rectus sheath implant. 4.2 cm implant along the left obturator externus musculature. Additional intramuscular implants in the right thigh (series 2/images 91 and 94). Left chest wall lesion (described above) involves the left anterior 6th rib. Otherwise, no focal osseous lesions. IMPRESSION: Widespread pulmonary, mediastinal,  and left chest wall/breast metastases. Widespread hepatic, pancreatic, and renal metastases. Widespread intramuscular, subcutaneous, and possibly peritoneal soft tissue metastases. Electronically Signed   By: Julian Hy M.D.   On: 09/20/2021 21:19   MR BRAIN W WO CONTRAST  Result Date: 09/17/2021 CLINICAL DATA:  Evaluation for brain mets EXAM: MRI HEAD WITHOUT AND WITH CONTRAST TECHNIQUE: Multiplanar, multiecho pulse sequences of the brain and surrounding structures were obtained without and with intravenous contrast. CONTRAST:  14mL GADAVIST GADOBUTROL 1 MMOL/ML IV SOLN COMPARISON:  None Available. FINDINGS: Brain: There are many enhancing lesions, compatible with intracranial metastasis. Index lesions are annotated on series 20 and include: - 2.9 x 0.9 cm lesion along the posterior aspect of the right cerebellum with extension into the adjacent extra-axial space and probable invasion of the adjacent transverse sinus, which appears narrowed but patent (image 25) . -1 cm lesion left cerebellum (image 25). -6 mm lesion right cerebellum (image 19). -9 mm pineal lesion (image 65, possibly a metastasis given other metastases. -4 mm right parietal lesion (image 102). -4 mm posterior right frontal lesion (image 102). -Small right precentral gyrus lesion (image 108). -9 mm right frontal lesion (image 112). -1.5 cm right frontal lesion with extra-axial extension involvement of the overlying dura (image 121). -Small left frontal lesion (image 118). -Small left parietal lesion (image 120). -Small left precentral gyrus lesion (image 101). -Punctate right temporal lesion (image 58). -Small left parietal lesion (image 93) - Numerous additional small hemorrhagic lesions, most conspicuous on susceptibility weighted imaging and too numerous to count. The left cerebellar metastasis has signal characteristics compatible with recent hemorrhage. Edema surrounding many these lesions with out significant mass effect. No midline  shift. No hydrocephalus or acute infarct. Vascular: Major arterial flow voids are maintained at the skull base. Skull and upper cervical spine: Peripherally enhancing 2.5 cm lesion in the  subcutaneous soft tissues along in the posterior upper neck. Sinuses/Orbits: Clear sinuses.  No acute orbital findings. Other: No mastoid effusions. IMPRESSION: 1. Numerous intraparenchymal hemorrhagic metastases with index lesions detailed above. This includes plaque-like 2.9 cm lesion in the posterior right cerebellum with overlying extra-axial extension into the adjacent transverse sinus as well as 1.5 cm lesion in the right frontal lobe with overlying extra-axial extension into the adjacent dura. 2. Peripherally enhancing 2.5 cm lesion in the subcutaneous soft tissues along in the posterior upper neck, suspicious for extracranial soft tissue metastasis. Complex cyst is a consideration (infection not excluded). Electronically Signed   By: Margaretha Sheffield M.D.   On: 09/17/2021 16:24   [place] CORE BIOPSY (SOFT TISSUE)  Result Date: 09/13/2021 INDICATION: 56 year old female with right suprahilar lung mass and multifocal soft tissue masses concerning for metastases. EXAM: Ultrasound-guided left biceps soft tissue mass biopsy MEDICATIONS: None. ANESTHESIA/SEDATION: Moderate (conscious) sedation was employed during this procedure. A total of Versed 1 mg and Fentanyl 50 mcg was administered intravenously. Moderate Sedation Time: 10 minutes. The patient's level of consciousness and vital signs were monitored continuously by radiology nursing throughout the procedure under my direct supervision. FLUOROSCOPY TIME:  None. COMPLICATIONS: None immediate. PROCEDURE: Informed written consent was obtained from the patient after a thorough discussion of the procedural risks, benefits and alternatives. All questions were addressed. Maximal Sterile Barrier Technique was utilized including caps, mask, sterile gowns, sterile gloves, sterile drape,  hand hygiene and skin antiseptic. A timeout was performed prior to the initiation of the procedure. The left upper extremity was prepped and draped in standard fashion. Preprocedure ultrasound evaluation demonstrated large intramuscular soft tissue mass within the anterior left biceps muscle, compatible with findings on recent comparison left upper extremity CT. The procedure was planned. Subdermal Local anesthesia was administered at the planned needle entry site with 1% lidocaine. Deeper local anesthetic was administered under direct ultrasound visualization along the periphery of the soft tissue mass. A small skin nick was made. Next, under direct ultrasound visualization, a 17 gauge coaxial introducer needle was advanced to the periphery of the mass. A total of 3, 18 gauge core biopsies were obtained. The samples were placed in formalin. The introducer needle was removed and hemostasis was achieved with brief manual compression. The patient tolerated the procedure well was transferred back to the floor in stable condition. IMPRESSION: Technically successful ultrasound-guided left biceps soft tissue mass core biopsy. [name], MD [facility] Electronically Signed   By: [name] M.D.   On: 09/13/2021 16:38    Microbiology: Results for orders placed or performed during the hospital encounter of 09/15/2021  Urine Culture     Status: Abnormal   Collection Time: 09/26/21  5:14 PM   Specimen: Urine, Clean Catch  Result Value Ref Range Status   Specimen Description   Final    URINE, CLEAN CATCH Performed at [facility], [place] [address], [place], [place] [address]    Special Requests   Final    NONE Performed at [facility], [place] [address] [place], [place] [address]    Culture (A)  Final    80,000 COLONIES/mL DIPHTHEROIDS(CORYNEBACTERIUM SPECIES) Standardized susceptibility testing for this organism  is not available. Performed at [facility], [place] [address], [place],  [address]    Report Status 09/27/2021 FINAL  Final    Labs: CBC: No results for input(s): "WBC", "NEUTROABS", "HGB", "HCT", "MCV", "PLT" in the last 168  hours. Basic Metabolic Panel: No results for input(s): "NA", "K", "CL", "CO2", "GLUCOSE", "BUN", "CREATININE", "CALCIUM", "MG", "PHOS" in the last 168 hours. Liver Function Tests: No results for input(s): "AST", "ALT", "ALKPHOS", "BILITOT", "PROT", "ALBUMIN" in the last 168 hours. CBG: No results for input(s): "GLUCAP" in the last 168 hours.  Discharge time spent: less than 30 minutes.  Signed: Bonnell Public, MD [facility] 10/10/2021

## 2021-10-25 DEATH — deceased

## 2022-01-14 NOTE — Progress Notes (Signed)
  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  Beams/energy:   A 3D field set-up was employed with 6 MV X-rays  Narrative: The patient elected to discontinue all radiation treatments and transition to comfort care with hospice.  Plan: The patient deceased on 10-27-21 during her hospital admission. ________________________________  Sheral Apley [name], M.D.

## 2022-01-14 NOTE — Progress Notes (Signed)
  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   Beams/energy:   A 3D field set-up was employed with 6 MV X-rays  Narrative: Treatment was discontinued after completing 8 of 10 fractions secondary to inability to lay flat for treatments.  Plan: The patient was planned to start pelvic radiation in hopes that this would help manage her pain and we would be able to complete her planned treatments but unfortunately, she passed during her hospital admission, prior to completing treatment. ________________________________  [name]. [name], M.D.

## 2022-08-19 IMAGING — CT CT HEAD W/O CM
4 series · 17 of 47 positions shown, 19 images · non-contrast
Comparison: None.

CLINICAL DATA: Cerebral hemorrhage suspected, altered mental status

EXAM:
CT HEAD WITHOUT CONTRAST
TECHNIQUE: Contiguous axial images were obtained from the base of the skull
through the vertex without intravenous contrast.

[Series 2: head without · axial · non-contrast · 0.43mm/px · z∈[-318,-188]mm · 7 of 36 slices shown, 9 images]
[im 5/36  brain]
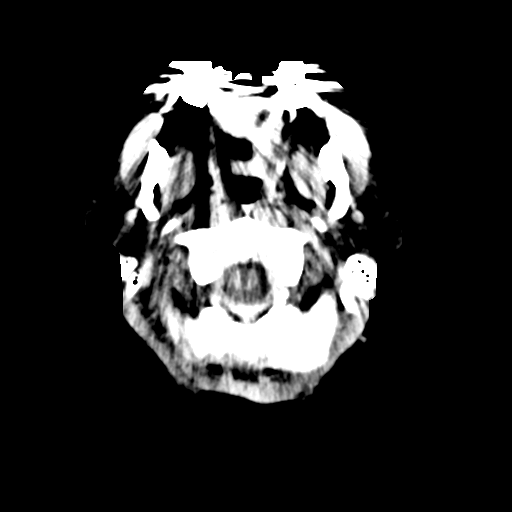
[im 5/36  bone]
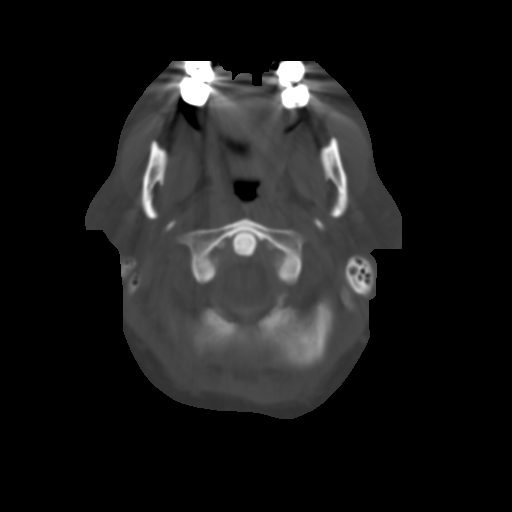
[im 9/36  brain]
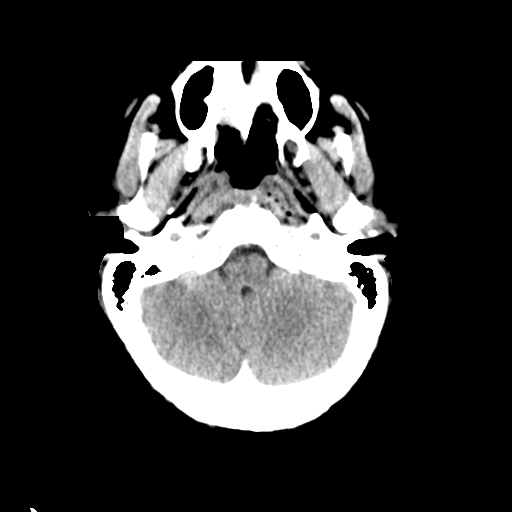
[im 14/36  brain]
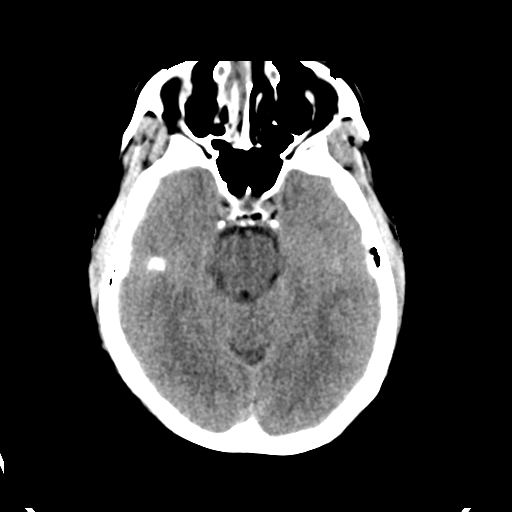
[im 18/36  brain]
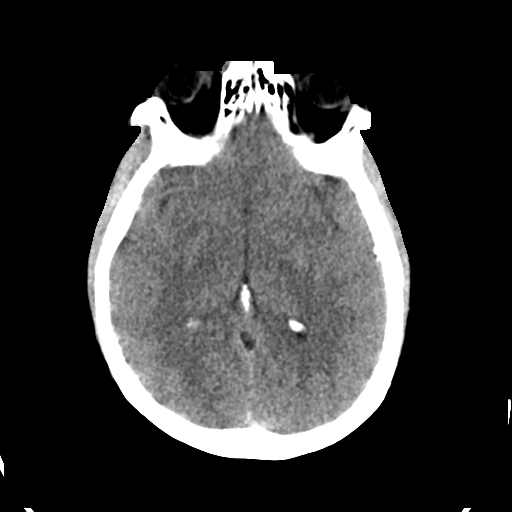
[im 22/36  brain]
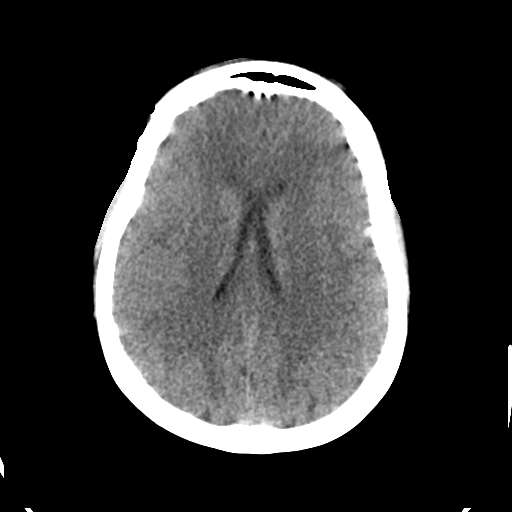
[im 22/36  bone]
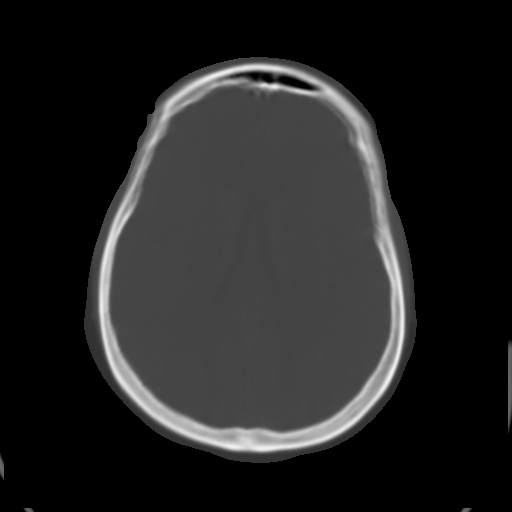
[im 27/36  brain]
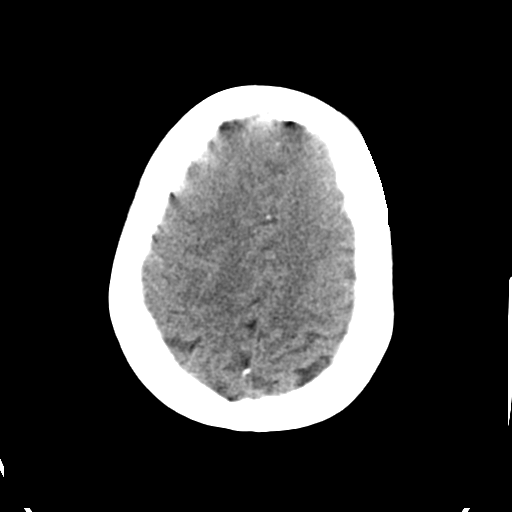
[im 31/36  brain]
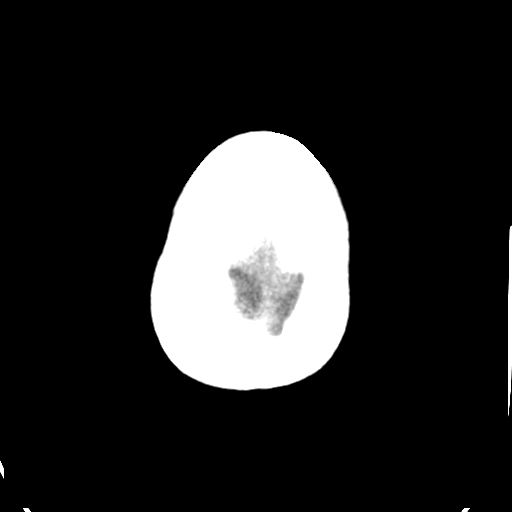

[Series 3: head bone · axial · 0.43mm/px · z∈[-322,-260]mm · 4 of 88 slices shown]
[im 9/88  bone]
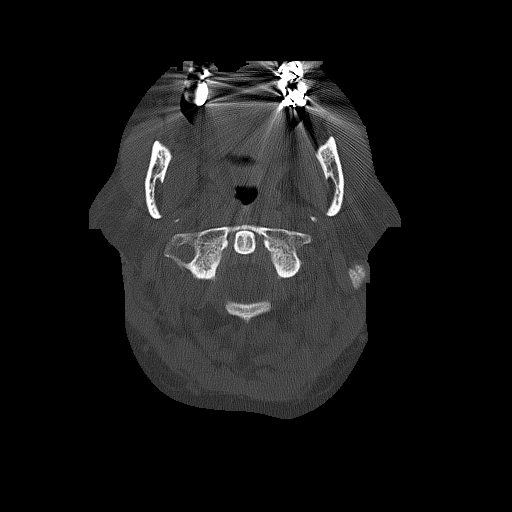
[im 18/88  bone]
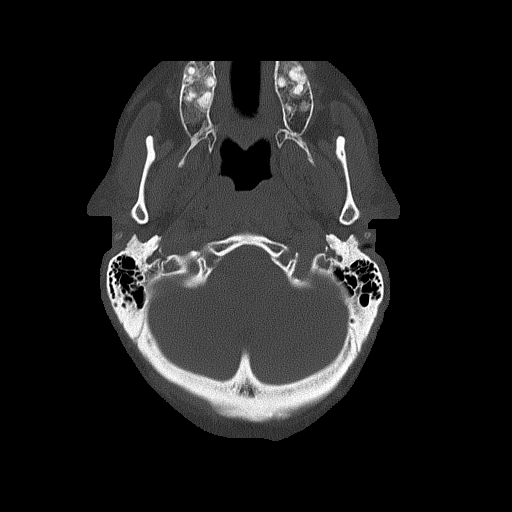
[im 27/88  bone]
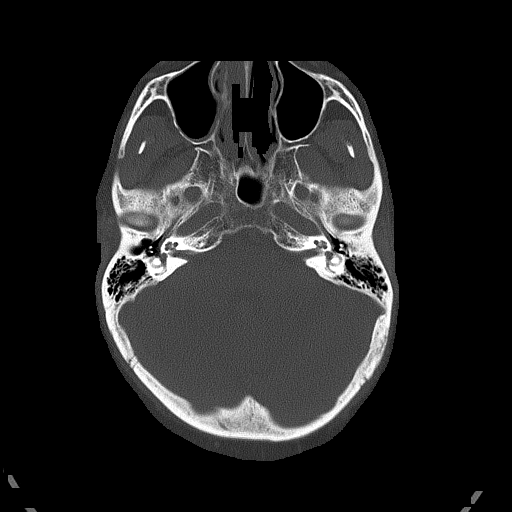
[im 40/88  bone]
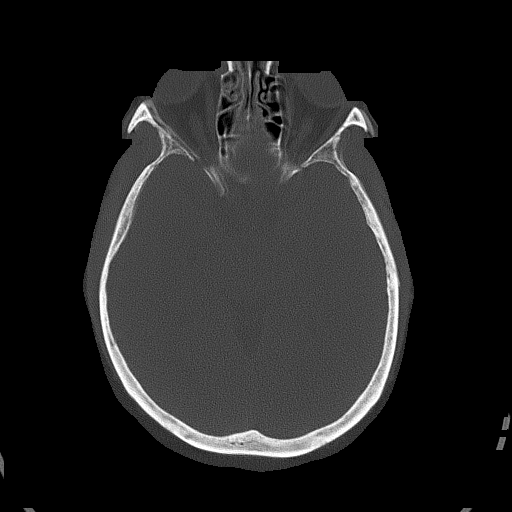

[Series 4: head without cor · coronal · non-contrast · 0.33mm/px · 3 of 67 slices shown]
[im 23/67  brain]
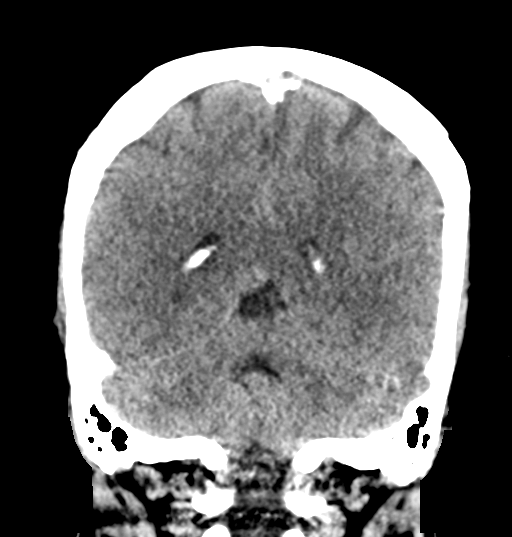
[im 30/67  brain]
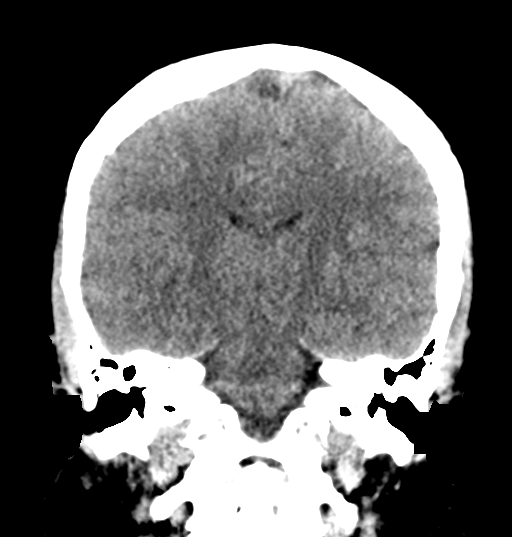
[im 37/67  brain]
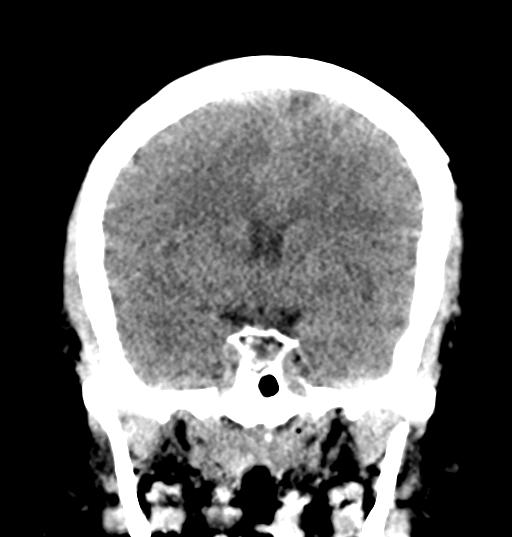

[Series 5: head without sag · sagittal · non-contrast · 0.34mm/px · 3 of 67 slices shown]
[im 23/67  brain]
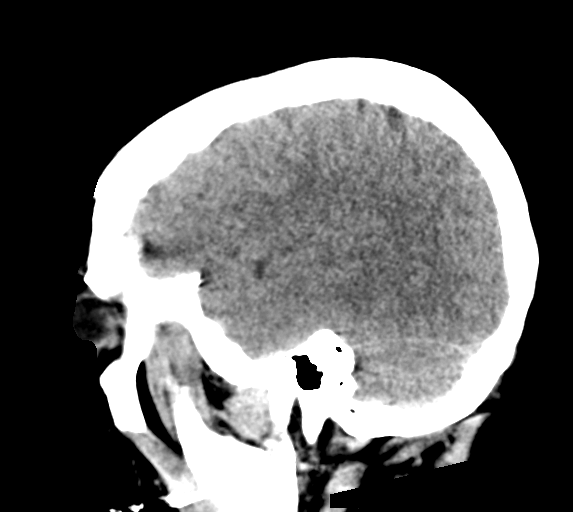
[im 34/67  brain]
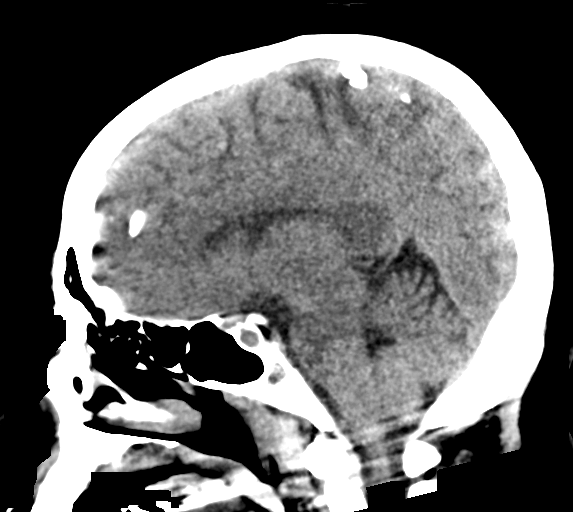
[im 45/67  brain]
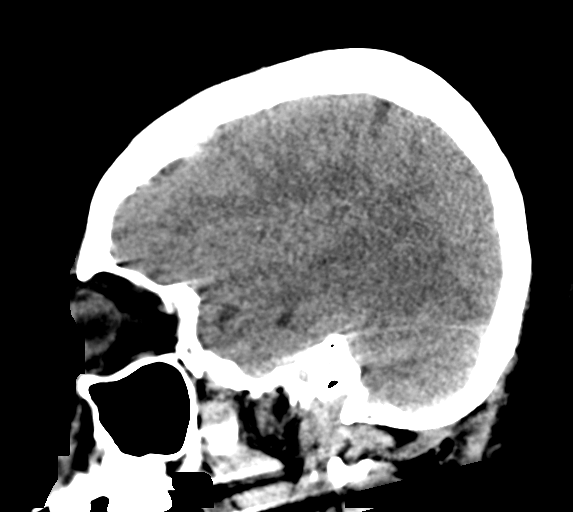

[17 of 47 positions shown; findings below may reference images not displayed]

FINDINGS: Brain: Motion degraded imaging, particularly towards the vertex. No
evidence of acute infarction, hemorrhage, hydrocephalus, extra-axial
collection, visible mass lesion or mass effect.

Vascular: No hyperdense vessel or unexpected calcification.

Skull: No calvarial fracture or suspicious osseous lesion. No scalp
swelling or hematoma.

Sinuses/Orbits: No gross abnormality of the sinuses orbits though
evaluation limited by motion artifact. Mastoid air cells are
predominantly clear. Small amount of debris in the right external
auditory canal.

Other: Mild right greater than left TMJ arthrosis.
IMPRESSION: 1. Motion degraded imaging, particularly towards the vertex.
2. No definite acute intracranial abnormality.
3. Small amount of debris in the right external auditory canal,
correlate for cerumen impaction.
4. Mild right greater than left TMJ arthrosis.
